# Patient Record
Sex: Female | Born: 1985 | Race: White | Hispanic: No | Marital: Single
Health system: Midwestern US, Community
[De-identification: ages and names within clinical notes are randomized; demographics above are authoritative.]

## PROBLEM LIST (undated history)

## (undated) DIAGNOSIS — E039 Hypothyroidism, unspecified: Secondary | ICD-10-CM

## (undated) DIAGNOSIS — K7460 Unspecified cirrhosis of liver: Secondary | ICD-10-CM

## (undated) DIAGNOSIS — G629 Polyneuropathy, unspecified: Secondary | ICD-10-CM

## (undated) DIAGNOSIS — F419 Anxiety disorder, unspecified: Secondary | ICD-10-CM

## (undated) DIAGNOSIS — G621 Alcoholic polyneuropathy: Secondary | ICD-10-CM

## (undated) DIAGNOSIS — F19929 Other psychoactive substance use, unspecified with intoxication, unspecified: Secondary | ICD-10-CM

## (undated) DIAGNOSIS — F902 Attention-deficit hyperactivity disorder, combined type: Secondary | ICD-10-CM

## (undated) DIAGNOSIS — K7030 Alcoholic cirrhosis of liver without ascites: Secondary | ICD-10-CM

## (undated) HISTORY — DX: Hypothyroidism, unspecified: E03.9

## (undated) HISTORY — DX: Unspecified cirrhosis of liver: K74.60

## (undated) HISTORY — DX: Anxiety disorder, unspecified: F41.9

## (undated) HISTORY — DX: Polyneuropathy, unspecified: G62.9

---

## 2006-03-24 HISTORY — PX: WISDOM TOOTH EXTRACTION: SHX21

## 2012-05-06 ENCOUNTER — Emergency Department: Payer: Self-pay

## 2012-05-06 ENCOUNTER — Emergency Department
Admission: EM | Admit: 2012-05-06 | Discharge: 2012-05-06 | Disposition: A | Discharge status: Home / Self Care | Payer: Self-pay | Attending: Emergency Medical Services | Admitting: Emergency Medical Services

## 2012-05-06 DIAGNOSIS — W269XXA Contact with unspecified sharp object(s), initial encounter: Secondary | ICD-10-CM | POA: Insufficient documentation

## 2012-05-06 DIAGNOSIS — F17200 Nicotine dependence, unspecified, uncomplicated: Secondary | ICD-10-CM | POA: Insufficient documentation

## 2012-05-06 DIAGNOSIS — S61209A Unspecified open wound of unspecified finger without damage to nail, initial encounter: Secondary | ICD-10-CM | POA: Insufficient documentation
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
## 2012-05-06 NOTE — ED Notes (Signed)
Bed:A11<BR> Expected date:<BR> Expected time:<BR> Means of arrival:<BR> Comments:<BR> Medic 417

## 2015-11-13 ENCOUNTER — Encounter (HOSPITAL_BASED_OUTPATIENT_CLINIC_OR_DEPARTMENT_OTHER): Payer: Self-pay | Admitting: Professional

## 2015-11-13 NOTE — Progress Notes (Signed)
Pt declined services as she does not have insurance at this time. She was given the number for [facility]

## 2016-02-13 ENCOUNTER — Other Ambulatory Visit: Payer: Self-pay | Admitting: Cardiovascular Disease

## 2016-02-13 DIAGNOSIS — R55 Syncope and collapse: Secondary | ICD-10-CM

## 2016-04-16 ENCOUNTER — Ambulatory Visit: Payer: Self-pay

## 2016-12-03 ENCOUNTER — Ambulatory Visit (INDEPENDENT_AMBULATORY_CARE_PROVIDER_SITE_OTHER): Payer: Self-pay

## 2016-12-19 ENCOUNTER — Encounter (HOSPITAL_BASED_OUTPATIENT_CLINIC_OR_DEPARTMENT_OTHER): Payer: Self-pay

## 2016-12-19 NOTE — Progress Notes (Signed)
Date/Time: 12/19/2016, 12:05 PM  Interviewer: Donivan Scull  Patient Name: Erin Huber   DOB: [birth year]  SSN: [id number]  [id number]  Gender: female  Patient Address:  [phone] [facility] [address] [address]  Patient Phone Number(s):  [phone] (home)  , additional phone number: [phone] ( Mom's Number) Pam     Have you confirmed the pt's contact information above? Yes      Emergency Contact: Berkley Harvey, Phone number: [phone]  Relationship to Pt: sister   May we contact this person regarding your admission? yes    Caller: Audree Bane  , Caller phone:[phone], Caller relationship:    Insurance Info:   Company: Development worker, community [facility]):   Audree Bane   Relationship to PT: self     PH DOB: 12/16/2018  Member ID: [id number]  Group #: [id number]    Insured Employer: [employer]  Phone number: Providers/Mental Health/SA: [phone]  Insurance Address: [address] [place] [address]    Identify if possible:   Referring Provider Name: n/a  Phone Number, if available:n/a      Clinical Information  Presenting Problem: Pt reports she is drinking too much  Precipitating Event: Liver Failure    Suicidal/Homicidal Risk?    Any hx of Suicidal thoughts or plans? No    Any hx of Homicidal Thoughts or Plans?  No    Imminent Risk of Suicide and/or Harm to Others?  No    TASR Risk / Protective Factors:   No risk factors           Drug Use  Do you use tobacco?: Yes 1/2-1 pack a day     Any current use of alcohol or drugs? alcohol    Include type, frequency, amount and route of use    Drug of Choice # 1: Vokda  Pattern of use:6 shots a day  Last use? 12/18/2016    Drug of Choice # 2 : White Wine   Pattern of use: 1 bottle a day  Last use? 14 days     Drug of Choice # 3 : Kathlene November Hard Lemonade  Pattern of use: 3 bottles  Last use?: 12/18/2016    Additional drug use info: Pt denies additional drug use    Current Withdrawal Symptoms:  Shaking, nausea, chills, sweats    History of and last date of:  12/19/2016- pt is currently experiencing withdrawls    Hallucinations? No          Seizures?  No    Psych/Mental health Issues: anxiety , panic attacks and polysubstance dependence       Medical issues: Liver Failure, Low K+ levels    Current Medication (medical or psychiatric): Geraponum- advised not to take anymore ( Derapamil)             Allopurinol: 100mg /daily    Pt is unaware of who many medictions    Additional Notes:   [name] lives with boyfriend. Patient notes that she has a good support system. Mother and sister are are bed side  Pt started drinking at 13 yr/old.  Drinking became an issue during high school    Does the patient require Hard of Hearing Services? No    Does the patient require language services? No    Preliminary Diagnosis: Alcohol Use Disorder, Moderate  Alcohol Intoxication, With use disorder, moderate or severe    Recommended Level of Care (LOC): [facility] INPATIENT    Patient has been placed on the waittlst  for [facility].

## 2016-12-20 ENCOUNTER — Telehealth (HOSPITAL_BASED_OUTPATIENT_CLINIC_OR_DEPARTMENT_OTHER): Payer: Self-pay | Admitting: Addiction (Substance Use Disorder)

## 2016-12-20 NOTE — Telephone Encounter (Signed)
TW spoke with PT who is currently in [facility] hospital for Vitamin B deficiency.  Pt reports she has never been to treatment before, never had education on alcoholism, and never gone to AA for support.  Pt is choosing to decline bed to bed transfer and come off waitlist because she wants to discharge home.

## 2016-12-20 NOTE — Telephone Encounter (Signed)
Mother called back earlier.  Asked if her daughter could call me.  Spoke to August, Pt's sister, explained everything about bed to bed transfer arrangements.  Ended call.  Victorino Dike called back and stated "[facility] is saying they are not doing bed to bed transfer and will keep Pt one more day and discharge home.

## 2016-12-20 NOTE — Telephone Encounter (Signed)
Case manager called regarding bed to bed transfer tomorrow because she reports the doctor stated, "I am discharging her home because she is withdrawing."  This writer explained the process of bed to bed transfer to the case manager.  Case manager is going to talk to the doctor.   Case manager is reporting that the Pt was going to discharge home and present to [facility] through [facility] ER.  Case manager is now going back to doctor to discuss options.  Be aware that Victorino Dike, Pt's sister and Mother have both stated that the Pt will definitely drink again as soon as she leaves the hospital.  Family is not happy.

## 2016-12-21 ENCOUNTER — Encounter (HOSPITAL_BASED_OUTPATIENT_CLINIC_OR_DEPARTMENT_OTHER): Payer: Self-pay | Admitting: Addiction (Substance Use Disorder)

## 2016-12-21 ENCOUNTER — Inpatient Hospital Stay
Admission: RE | Admit: 2016-12-21 | Discharge: 2016-12-26 | DRG: 895 | Disposition: A | Discharge status: Home / Self Care | Payer: Commercial Managed Care - PPO | Source: Ambulatory Visit | Attending: Psychiatry | Admitting: Psychiatry

## 2016-12-21 ENCOUNTER — Emergency Department
Admission: EM | Admit: 2016-12-21 | Discharge: 2016-12-21 | Disposition: A | Discharge status: Other Acute Inpatient Hospital | Payer: Commercial Managed Care - PPO | Attending: Emergency Medicine | Admitting: Emergency Medicine

## 2016-12-21 ENCOUNTER — Emergency Department (EMERGENCY_DEPARTMENT_HOSPITAL): Payer: Commercial Managed Care - PPO

## 2016-12-21 ENCOUNTER — Encounter: Payer: Self-pay | Admitting: Addiction Medicine

## 2016-12-21 DIAGNOSIS — E875 Hyperkalemia: Secondary | ICD-10-CM | POA: Insufficient documentation

## 2016-12-21 DIAGNOSIS — D649 Anemia, unspecified: Secondary | ICD-10-CM | POA: Diagnosis present

## 2016-12-21 DIAGNOSIS — F17210 Nicotine dependence, cigarettes, uncomplicated: Secondary | ICD-10-CM | POA: Diagnosis present

## 2016-12-21 DIAGNOSIS — F1010 Alcohol abuse, uncomplicated: Secondary | ICD-10-CM | POA: Insufficient documentation

## 2016-12-21 DIAGNOSIS — G4700 Insomnia, unspecified: Secondary | ICD-10-CM | POA: Diagnosis present

## 2016-12-21 DIAGNOSIS — F419 Anxiety disorder, unspecified: Secondary | ICD-10-CM | POA: Diagnosis present

## 2016-12-21 DIAGNOSIS — F1020 Alcohol dependence, uncomplicated: Secondary | ICD-10-CM

## 2016-12-21 DIAGNOSIS — D696 Thrombocytopenia, unspecified: Secondary | ICD-10-CM | POA: Diagnosis present

## 2016-12-21 DIAGNOSIS — F10239 Alcohol dependence with withdrawal, unspecified: Principal | ICD-10-CM | POA: Diagnosis present

## 2016-12-21 DIAGNOSIS — K709 Alcoholic liver disease, unspecified: Secondary | ICD-10-CM | POA: Diagnosis present

## 2016-12-21 DIAGNOSIS — R6889 Other general symptoms and signs: Secondary | ICD-10-CM | POA: Diagnosis present

## 2016-12-21 LAB — ETHANOL: Alcohol: 72 mg/dL — ABNORMAL HIGH

## 2016-12-21 LAB — RAPID DRUG SCREEN, URINE
Barbiturate Screen, UR: NEGATIVE
Benzodiazepine Screen, UR: NEGATIVE
Cannabinoid Screen, UR: NEGATIVE
Cocaine, UR: NEGATIVE
Opiate Screen, UR: NEGATIVE
PCP Screen, UR: NEGATIVE
Urine Amphetamine Screen: NEGATIVE

## 2016-12-21 LAB — BASIC METABOLIC PANEL
BUN: 4 mg/dL — ABNORMAL LOW (ref 7.0–19.0)
CO2: 16 mEq/L — ABNORMAL LOW (ref 22–29)
Calcium: 10 mg/dL (ref 8.5–10.5)
Chloride: 100 mEq/L (ref 100–111)
Creatinine: 0.7 mg/dL (ref 0.6–1.0)
Glucose: 88 mg/dL (ref 70–100)
Potassium: 5.4 mEq/L — ABNORMAL HIGH (ref 3.5–5.1)
Sodium: 133 mEq/L — ABNORMAL LOW (ref 136–145)

## 2016-12-21 LAB — GFR: EGFR: >60

## 2016-12-21 MED ORDER — TRAZODONE HCL 50 MG PO TABS
75.0000 mg | ORAL_TABLET | Freq: Every evening | ORAL | Status: DC | PRN
Start: 2016-12-21 — End: 2016-12-26
  Administered 2016-12-21 – 2016-12-25 (×5): 75 mg via ORAL
  Filled 2016-12-21 (×5): qty 2

## 2016-12-21 MED ORDER — SUCRALFATE 1 GM/10ML PO SUSP
1.0000 g | Freq: Four times a day (QID) | ORAL | Status: DC
Start: 2016-12-21 — End: 2016-12-26
  Administered 2016-12-21 – 2016-12-26 (×18): 1 g via ORAL
  Filled 2016-12-21 (×18): qty 10

## 2016-12-21 MED ORDER — ONDANSETRON 4 MG PO TBDP
4.0000 mg | ORAL_TABLET | Freq: Four times a day (QID) | ORAL | Status: DC | PRN
Start: 2016-12-21 — End: 2016-12-26

## 2016-12-21 MED ORDER — NALOXONE HCL 0.4 MG/ML IJ SOLN (WRAP)
0.8000 mg | INTRAMUSCULAR | Status: DC | PRN
Start: 2016-12-21 — End: 2016-12-26

## 2016-12-21 MED ORDER — LORAZEPAM 1 MG PO TABS
2.0000 mg | ORAL_TABLET | ORAL | Status: AC | PRN
Start: 2016-12-22 — End: 2016-12-23
  Administered 2016-12-22 – 2016-12-23 (×6): 2 mg via ORAL
  Filled 2016-12-21 (×6): qty 2

## 2016-12-21 MED ORDER — DICYCLOMINE HCL 10 MG PO CAPS
20.0000 mg | ORAL_CAPSULE | ORAL | Status: DC | PRN
Start: 2016-12-21 — End: 2016-12-26
  Administered 2016-12-21: 21:00:00 20 mg via ORAL
  Filled 2016-12-21: qty 2

## 2016-12-21 MED ORDER — PROPRANOLOL HCL 10 MG PO TABS
20.0000 mg | ORAL_TABLET | Freq: Three times a day (TID) | ORAL | Status: DC | PRN
Start: 2016-12-21 — End: 2016-12-26
  Administered 2016-12-22 – 2016-12-25 (×2): 20 mg via ORAL
  Filled 2016-12-21 (×2): qty 2

## 2016-12-21 MED ORDER — HYDROXYZINE PAMOATE 25 MG PO CAPS
25.0000 mg | ORAL_CAPSULE | Freq: Four times a day (QID) | ORAL | Status: DC | PRN
Start: 2016-12-21 — End: 2016-12-26
  Administered 2016-12-23 – 2016-12-25 (×3): 25 mg via ORAL
  Filled 2016-12-21 (×4): qty 1

## 2016-12-21 MED ORDER — ALLOPURINOL 100 MG PO TABS
100.0000 mg | ORAL_TABLET | Freq: Every day | ORAL | Status: DC
Start: 2016-12-22 — End: 2016-12-26
  Administered 2016-12-22 – 2016-12-26 (×5): 100 mg via ORAL
  Filled 2016-12-21 (×5): qty 1

## 2016-12-21 MED ORDER — IBUPROFEN 400 MG PO TABS
400.0000 mg | ORAL_TABLET | Freq: Four times a day (QID) | ORAL | Status: DC | PRN
Start: 2016-12-21 — End: 2016-12-26

## 2016-12-21 MED ORDER — MAGNESIUM HYDROXIDE 400 MG/5ML PO SUSP
30.0000 mL | Freq: Every evening | ORAL | Status: DC | PRN
Start: 2016-12-21 — End: 2016-12-26

## 2016-12-21 MED ORDER — LORAZEPAM 1 MG PO TABS
1.0000 mg | ORAL_TABLET | Freq: Four times a day (QID) | ORAL | Status: AC | PRN
Start: 2016-12-24 — End: 2016-12-25
  Administered 2016-12-24 – 2016-12-25 (×4): 1 mg via ORAL
  Filled 2016-12-21 (×4): qty 1

## 2016-12-21 MED ORDER — SODIUM CHLORIDE 0.9 % IV BOLUS
1000.0000 mL | Freq: Once | INTRAVENOUS | Status: AC
Start: 2016-12-21 — End: 2016-12-21
  Administered 2016-12-21: 1000 mL via INTRAVENOUS

## 2016-12-21 MED ORDER — LORAZEPAM 2 MG/ML IJ SOLN
2.0000 mg | Freq: Once | INTRAMUSCULAR | Status: AC
Start: 2016-12-21 — End: 2016-12-21
  Administered 2016-12-21: 2 mg via INTRAVENOUS
  Filled 2016-12-21: qty 1

## 2016-12-21 MED ORDER — LORAZEPAM 1 MG PO TABS
1.0000 mg | ORAL_TABLET | ORAL | Status: AC | PRN
Start: 2016-12-23 — End: 2016-12-24
  Administered 2016-12-24 (×3): 1 mg via ORAL
  Filled 2016-12-21 (×3): qty 1

## 2016-12-21 MED ORDER — TAB-A-VITE/BETA CAROTENE PO TABS
1.0000 | ORAL_TABLET | Freq: Every day | ORAL | Status: DC
Start: 2016-12-21 — End: 2016-12-26
  Administered 2016-12-21 – 2016-12-26 (×6): 1 via ORAL
  Filled 2016-12-21 (×6): qty 1

## 2016-12-21 MED ORDER — THIAMINE (VITAMIN B1) 100 MG PO TABS (WRAP)
100.0000 mg | ORAL_TABLET | Freq: Every day | ORAL | Status: DC
Start: 2016-12-21 — End: 2016-12-26
  Administered 2016-12-22 – 2016-12-26 (×5): 100 mg via ORAL
  Filled 2016-12-21 (×5): qty 1

## 2016-12-21 MED ORDER — LORAZEPAM 1 MG PO TABS
2.0000 mg | ORAL_TABLET | ORAL | Status: AC | PRN
Start: 2016-12-21 — End: 2016-12-22
  Administered 2016-12-21 – 2016-12-22 (×3): 2 mg via ORAL
  Filled 2016-12-21 (×3): qty 2

## 2016-12-21 MED ORDER — NICOTINE 21 MG/24HR TD PT24
1.0000 | MEDICATED_PATCH | Freq: Every day | TRANSDERMAL | Status: DC
Start: 2016-12-21 — End: 2016-12-26
  Administered 2016-12-22 – 2016-12-26 (×5): 1 via TRANSDERMAL
  Filled 2016-12-21 (×5): qty 1

## 2016-12-21 MED ORDER — FOLIC ACID 1 MG PO TABS
1.0000 mg | ORAL_TABLET | Freq: Every day | ORAL | Status: DC
Start: 2016-12-21 — End: 2016-12-26
  Administered 2016-12-21 – 2016-12-26 (×6): 1 mg via ORAL
  Filled 2016-12-21 (×6): qty 1

## 2016-12-21 MED ORDER — NICOTINE POLACRILEX 2 MG MT GUM
2.0000 mg | CHEWING_GUM | OROMUCOSAL | Status: DC | PRN
Start: 2016-12-21 — End: 2016-12-26
  Administered 2016-12-22: 18:00:00 2 mg via BUCCAL
  Filled 2016-12-21: qty 1

## 2016-12-21 MED ORDER — LOPERAMIDE HCL 2 MG PO CAPS
2.0000 mg | ORAL_CAPSULE | ORAL | Status: DC | PRN
Start: 2016-12-21 — End: 2016-12-26

## 2016-12-21 MED ORDER — TUBERCULIN PPD 5 UNIT/0.1ML ID SOLN
0.1000 mL | Freq: Once | INTRADERMAL | Status: DC
Start: 2016-12-21 — End: 2016-12-26

## 2016-12-21 MED ORDER — ALUM & MAG HYDROXIDE-SIMETH 200-200-20 MG/5ML PO SUSP
30.0000 mL | ORAL | Status: DC | PRN
Start: 2016-12-21 — End: 2016-12-26

## 2016-12-21 MED ORDER — FAMOTIDINE 20 MG PO TABS
20.0000 mg | ORAL_TABLET | Freq: Two times a day (BID) | ORAL | Status: DC
Start: 2016-12-21 — End: 2016-12-26
  Administered 2016-12-21 – 2016-12-26 (×10): 20 mg via ORAL
  Filled 2016-12-21 (×10): qty 1

## 2016-12-21 MED ORDER — THIAMINE HCL 100 MG/ML IJ SOLN
100.0000 mg | Freq: Once | INTRAMUSCULAR | Status: AC
Start: 2016-12-21 — End: 2016-12-21
  Administered 2016-12-21: 21:00:00 100 mg via INTRAMUSCULAR
  Filled 2016-12-21: qty 2

## 2016-12-21 MED ORDER — LORAZEPAM 1 MG PO TABS
1.0000 mg | ORAL_TABLET | Freq: Three times a day (TID) | ORAL | Status: DC | PRN
Start: 2016-12-25 — End: 2016-12-26

## 2016-12-21 NOTE — ED Provider Notes (Signed)
I ordered testing and/or medication from triage to facilitate patient care and comfort. However, I was otherwise uninvolved in patient care.    31yo F with hx ETOH abuse, seen at [facility], medically cleared for [facility], was on their way to [facility], but stopped by to get a drink of alcohol. Per staff at [facility], pt needs repeat etoh and drugs of abuse only and then can go to [facility].      Erin Alvine, MD  12/21/16 [phone]

## 2016-12-21 NOTE — ED Provider Notes (Signed)
[facility] EMERGENCY DEPARTMENT H&P      Visit date: 12/21/2016      CLINICAL SUMMARY           Diagnosis:    .     Final diagnoses:   Alcohol abuse   Hyperkalemia         MDM Notes:      Alcohol abuse vs withdrawal vs substance abuse vs psychosis nos vs depression vs SI/HI         Disposition:         Transfer to [facility]                CLINICAL INFORMATION        HPI:      Chief Complaint: Inpatient Detox  .    Erin Huber is a 31 y.o. female with PMHx of liver damage who presents for transfer to [facility]. Pt was just seen at [facility] for detox with referral to [facility]. States that she already has a bed there. On the way to [facility] pt states that she stopped for a drink on the way to [facility] and now presents for inpatient detox. Pt states that she normally drinks either beer or liquor with her last drink 1-2 hours ago. Endorses tobacco use. Denies drug use, recent illnesses, and diarrhea.     History obtained from: patient, parent          ROS:      Positive and negative ROS elements as per HPI.  All other systems reviewed and negative.      Physical Exam:      Pulse (!) 120  BP 121/83  Resp 18  SpO2 99 %  Temp 97.9 F (36.6 C)    Physical Exam   Constitutional: She is oriented to person, place, and time. She appears well-developed and well-nourished. No distress.   Speech slurred.   HENT:   Head: Normocephalic and atraumatic.   Nose: Nose normal.   Mouth/Throat: Oropharynx is clear and moist.   Eyes: Pupils are equal, round, and reactive to light. Conjunctivae are normal. No scleral icterus.   Neck: Normal range of motion. Neck supple. No tracheal deviation present.   Cardiovascular: Regular rhythm, normal heart sounds and intact distal pulses.  Exam reveals no friction rub.    No murmur heard.  Tachycardic   Pulmonary/Chest: Effort normal and breath sounds normal. No stridor. No respiratory distress. She has no wheezes.   Abdominal: Soft. There is no tenderness. There is no rebound and no  guarding.   Musculoskeletal: Normal range of motion. She exhibits no edema or tenderness.   Neurological: She is alert and oriented to person, place, and time. She exhibits normal muscle tone. Coordination normal.   A&O x3   Skin: Skin is warm and dry. No rash noted. She is not diaphoretic. No erythema.   Psychiatric: She has a normal mood and affect. Her behavior is normal. Judgment and thought content normal.   Nursing note and vitals reviewed.                 PAST HISTORY        Primary Care Provider: Edmon Crape, MD        PMH/PSH:    .     History reviewed. No pertinent past medical history.    She has no past surgical history on file.      Social/Family History:      She reports that she has been smoking Cigarettes.  She has been smoking about 1.00 pack per day. She does not have any smokeless tobacco history on file. She reports that she drinks alcohol. Her drug history is not on file.    No family history on file.      Listed Medications on Arrival:    .     Home Medications     Med List Status:  In Progress Set By: Marzetta Board, RN at 12/21/2016  5:28 PM        No Medications         Allergies: She has No Known Allergies.            VISIT INFORMATION        Clinical Course in the ED:      K mildly elevated, 5.4, patient given 1 L NS, no peaked T waves on ekg, stable to go to [facility], admitted by Dr. Richardson Chiquito.           Medications Given in the ED:    .     ED Medication Orders     Start Ordered     Status Ordering Provider    12/21/16 1544 12/21/16 1543  sodium chloride 0.9 % bolus 1,000 mL  Once     Route: Intravenous  Ordered Dose: 1,000 mL     Last MAR action:  New Bag LEE, EUGENE KIM    12/21/16 1543 12/21/16 1543  LORazepam (ATIVAN) injection 2 mg  Once     Route: Intravenous  Ordered Dose: 2 mg     Last MAR action:  Given LEE, EUGENE KIM            Procedures:      Procedures      Interpretations:      O2 sat-           saturation: 99 %; Oxygen use: room air; Interpretation: Normal      EKG -              interpreted by me: normal sinus at 86.  No axis deviation; Non specific ST/T wave changes; no PQ waves; RSR pattern; no conduction delay.             RESULTS        Lab Results:      Results     Procedure Component Value Units Date/Time    GFR [[id number]] Collected:  12/21/16 1713     Updated:  12/21/16 1755     EGFR >60.0    Ethanol (Alcohol) Level [[id number]]  (Abnormal) Collected:  12/21/16 1713    Specimen:  Blood Updated:  12/21/16 1755     Alcohol 72 (H) mg/dL     Basic Metabolic Panel [[id number]]  (Abnormal) Collected:  12/21/16 1713    Specimen:  Blood Updated:  12/21/16 1755     Glucose 88 mg/dL      BUN 4.0 (L) mg/dL      Creatinine 0.7 mg/dL      Calcium 08.6 mg/dL      Sodium 578 (L) mEq/L      Potassium 5.4 (H) mEq/L      Chloride 100 mEq/L      CO2 16 (L) mEq/L     Rapid drug screen, urine [[id number]] Collected:  12/21/16 1714    Specimen:  Urine Updated:  12/21/16 1748     Amphetamine Screen, UR Negative     Barbiturate Screen, UR Negative     Benzodiazepine Screen, UR Negative  Cannabinoid Screen, UR Negative     Cocaine, UR Negative     Opiate Screen, UR Negative     PCP Screen, UR Negative              Radiology Results:      No orders to display               Scribe Attestation:      I, Alcide Evener, MD, personally performed the services documented.  Shalini Boddu is scribing for me on [place],Francia FOX. I reviewed and confirm the accuracy of the information in this medical record.     I, Shalini Boddu, am serving as a Neurosurgeon to document services personally performed by Alcide Evener, MD based on the provider's statements to me.           Alcide Evener, MD  12/23/16 [phone]

## 2016-12-21 NOTE — Discharge Instructions (Signed)
Dear Ms. Huel Cote:    Thank you for choosing the [facility] Emergency Department, the premier emergency department in the [place] area.  I hope your visit today was EXCELLENT.    Specific instructions for your visit today:    If you do not continue to improve or your condition worsens, please contact your doctor or return immediately to the Emergency Department.    Sincerely,  Alcide Evener,*  Attending Emergency Physician  [facility] Emergency Department    ONSITE PHARMACY  Our full service onsite pharmacy is located in the ER waiting room.  Open 7 days a week from 9 am to 11 pm.  We accept all major insurances and prices are competitive with major retailers.  Ask your provider to print your prescriptions down to the pharmacy to speed you on your way home.    OBTAINING A PRIMARY CARE APPOINTMENT    Primary care physicians (PCPs, also known as primary care doctors) are either internists or family medicine doctors. Both types of PCPs focus on health promotion, disease prevention, patient education and counseling, and treatment of acute and chronic medical conditions.    Call for an appointment with a primary care doctor.  Ask to see who is taking new patients.     [facility]  telephone:  [phone]  [email or web address]    DOCTOR REFERRALS  Call [phone] (available 24 hours a day, 7 days a week) if you need any further referrals and we can help you find a primary care doctor or specialist.  Also, available online at:  [email or web address]    YOUR CONTACT INFORMATION  Before leaving please check with registration to make sure we have an up-to-date contact number.  You can call registration at [phone] to update your information.  For questions about your hospital bill, please call [phone].  For questions about your Emergency Dept Physician bill please call [phone].      FREE HEALTH SERVICES  If you need help with health or social services,  please call 2-1-1 for a free referral to resources in your area.  2-1-1 is a free service connecting people with information on health insurance, free clinics, pregnancy, mental health, dental care, food assistance, housing, and substance abuse counseling.  Also, available online at:  http://www.[facility].org    MEDICAL RECORDS AND TESTS  Certain laboratory test results do not come back the same day, for example urine cultures.   We will contact you if other important findings are noted.  Radiology films are often reviewed again to ensure accuracy.  If there is any discrepancy, we will notify you.      Please call [phone] to pick up a complimentary CD of any radiology studies performed.  If you or your doctor would like to request a copy of your medical records, please call [phone].      ORTHOPEDIC INJURY   Please know that significant injuries can exist even when an initial x-ray is read as normal or negative.  This can occur because some fractures (broken bones) are not initially visible on x-rays.  For this reason, close outpatient follow-up with your primary care doctor or bone specialist (orthopedist) is required.    MEDICATIONS AND FOLLOWUP  Please be aware that some prescription medications can cause drowsiness.  Use caution when driving or operating machinery.    The examination and treatment you have received in our Emergency Department is provided on an emergency basis, and is not intended to be a substitute for  your primary care physician.  It is important that your doctor checks you again and that you report any new or remaining problems at that time.      [facility]  The nearest 24 hour pharmacy is:    [facility] [place], [place] [address]  [facility]  [facility])  Call to start or finish an application, compare plans, enroll or ask a question.  [place]: [phone]  Web:   Healthcare.gov    Help Enrolling in [place]  ([phone] 067-6931 (TOLL-FREE)  270-769-6442 (TTY)  Web:  Http://www.coverva.org    Local Help Enrolling in the [place]  8387519540 (MAIN)  Email:  health-help_0 .org  Web:  http://lewis-perez.info/  Address:  [address], [address] [place], [place] [address]    SEDATING MEDICATIONS  Sedating medications include strong pain medications (e.g. narcotics), muscle relaxers, benzodiazepines (used for anxiety and as muscle relaxers), Benadryl/diphenhydramine and other antihistamines for allergic reactions/itching, and other medications.  If you are unsure if you have received a sedating medication, please ask your physician or nurse.  If you received a sedating medication: DO NOT drive a car. DO NOT operate machinery. DO NOT perform jobs where you need to be alert.  DO NOT drink alcoholic beverages while taking this medicine.     If you get dizzy, sit or lie down at the first signs. Be careful going up and down stairs.  Be extra careful to prevent falls.     Never give this medicine to others.     Keep this medicine out of reach of children.     Do not take or save old medicines. Throw them away when outdated.     Keep all medicines in a cool, dry place. DO NOT keep them in your bathroom medicine cabinet or in a cabinet above the stove.    MEDICATION REFILLS  Please be aware that we cannot refill any prescriptions through the ER. If you need further treatment from what is provided at your ER visit, please follow up with your primary care doctor or your pain management specialist.    [place]  Did you know [facility] has two freestanding ERs located just a few miles away?  [facility] [place] [facility] [place] have short wait times, easy free parking directly in front of the building and top patient satisfaction scores - and the same Board Certified Emergency  Medicine doctors as [facility].

## 2016-12-21 NOTE — Plan of Care (Signed)
Erin Huber is a new patient, admitted to inpatient [facility] during the change of shift: Patient is a 31 year old female admitted for alcohol detox.  Patient was [facility] from [facility] ED to Inpatient [facility].  According to the Patient, has been drinking heavily of late; and usually drinks 8 to 10 shots of liquor, a bottle of wine, and 3 to 6 hard lemonades daily.  Denies any history of seizures, DTs and Hallucinations.  Patient also denies any SI/HI and AH/VH.  Was cooperative with the After hours Admission assessment/interview.  Was oriented to the Unit and the her room environment.  Patient was placed on Ativan Protocol.  Patient's mother visited, during the evening's visiting hours.  Remains cooperative and compliant with treatment and other treatment medications.  Will continue to monitor.  Staff monitoring for safety and support is ongoing and Patient is able to maintain safety.

## 2016-12-21 NOTE — Progress Notes (Addendum)
1809: TW called [employer] at [phone] and [phone] to complete an authorization for this pt's inpatient admission. TW reached [employer] outside of business hours and left a VM requesting a return call as well as providing contact information for TW and the [facility] UR. [facility] UR, Ramona C., notified.     1916: TW received return call from [facility], Junious Dresser. Care manager informed TW that [employer] only handles the benefits for this member's plan and Rosann Auerbach is the "UR vendor." Care Manager instructed this writer to call same number [phone]) during regular business hours to be directed to [employer] to complete the authorization. Celeste to document in system that TW attempted to complete authorization at this time.

## 2016-12-21 NOTE — Progress Notes (Signed)
Psychiatric Evaluation Part I    Erin Huber is a 31 y.o. female admitted to the [facility] Emergency Department who was seen on 12/21/2016 by Romie Levee, LCSW.    Call Details  Patient Location: [facility] ED  Patient Room Number: RW7  Time contacted by ED Physician: [phone]  Time consult began: 1640  Time (in minutes) from Call to Consult: 2  Time consult concluded: 1655  Referring ED Department  Emergency Department: [facility]        Discharge Planning  Living Arrangements: Spouse/significant other  Support Systems: Spouse/significant other  Type of Residence: Private residence  Patient expects to be discharged to:: home    Presenting Mental Status  Orientation Level: Oriented X4  Memory: No Impairment  Thought Content: normal  Thought Process: normal  Behavior: normal  Consciousness: Alert  Impulse Control: normal  Perception: normal  Eye Contact: normal  Attitude: cooperative  Mood: anxious  Hopelessness Affects Goals: No  Hopelessness About Future: No  Affect: normal  Speech: normal  Concentration: normal  Insight: fair  Judgment: fair  Appearance: normal  Appetite: normal  Weight change?: normal  Energy: normal  Sleep: normal  Reliability of Reporter/Patient: good    Tool for Assessment of Suicide Risk  Symptom Risk Profile: anxiety  Interview Risk Profile: recent substance abuse  Protective Factors: supportive significant other, safety plan, employed  Level of Suicide Risk: Low    Within the Last 6 Months:: no history of violence toward self  Greater than 6 Months Ago:: no history of violence toward self              Substance Abuse History  Previous Substance Abuse Treatment?: No  Self-Help Group Involvement  Current Involvement: None  Past Involvement: None  Substance Recovery Support  Have you been prescribed medications to support recovery?: None  Past Withdrawal Symptoms  Past Withdrawal Symptoms: Anxiety, Tremors, Nausea/vomiting  History of Blackouts?: No  History of Withdrawal Seizures?:  No    Preliminary Diagnosis #1: alcohol use disorder, severe, dependence F10.20           Violence Toward Others  Within the Last 6 Months:: no history of violence toward others  Greater than 6 Months Ago:: no history of violence toward others     Preliminary Diagnosis (DSM IV)  Axis I: alcohol use disorder, severe, dependence   Axis II: deferred   Axis III: gout, hypokalemia         Summary: 31 y/o F who presents to the ER for [facility] admission. Pt states her drinking only became a problem a few months ago, although she acknowledges she started drinking at 72 and had several alcohol related charges as a teen (DIP at 4, possession of ETOH at 46, and a DUI at 24). Pt reports over the past few months, she has been heavily drinking, usually 8-10 shots of liquor, a bottle of wine, and 3-6 hard lemonades daily. Pt reports withdrawal symptoms of tremors, n/v, chills, sweats, and anxiety. Pt denies hx of withdrawal seizures or hallucinations. Pt reports she smokes, 1/2-1 ppd. Pt denies other substance abuse. Pt was d/c from [place] earlier today due to gout/hypokalemia and was due to go directly to [facility]; however, EMS transport was not set up, and mother brought pt to [facility] ER after stopping to get pt a drink. Pt denies previous psychiatric treatment. Pt denies SI/HI, AH/VH. 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 Pt's mother is present in ER with pt and appears supportive.     Disposition: accepted to [facility] by Dr. Richardson Chiquito, who requests repeat BMP, ETOH, and UDS. Dr. Gerarda Fraction aware.     Justification for disposition: alcohol dependence     Insurance Pre-authorization information: [employer]--pending by MHT Deniece Ree, LCSW    [facility]  [address]  [place], [place] [address]  [phone]

## 2016-12-22 DIAGNOSIS — F329 Major depressive disorder, single episode, unspecified: Secondary | ICD-10-CM

## 2016-12-22 DIAGNOSIS — F419 Anxiety disorder, unspecified: Secondary | ICD-10-CM

## 2016-12-22 DIAGNOSIS — R6889 Other general symptoms and signs: Secondary | ICD-10-CM

## 2016-12-22 DIAGNOSIS — F10239 Alcohol dependence with withdrawal, unspecified: Principal | ICD-10-CM

## 2016-12-22 DIAGNOSIS — F17200 Nicotine dependence, unspecified, uncomplicated: Secondary | ICD-10-CM

## 2016-12-22 LAB — ALT: ALT: 47 U/L (ref 0–55)

## 2016-12-22 LAB — BASIC METABOLIC PANEL
BUN: 4 mg/dL — ABNORMAL LOW (ref 7.0–19.0)
CO2: 20 mEq/L — ABNORMAL LOW (ref 21–29)
Calcium: 9.3 mg/dL (ref 8.5–10.5)
Chloride: 106 mEq/L (ref 100–111)
Creatinine: 0.7 mg/dL (ref 0.4–1.5)
Glucose: 90 mg/dL (ref 70–100)
Potassium: 4.5 mEq/L (ref 3.5–5.1)
Sodium: 135 mEq/L — ABNORMAL LOW (ref 136–145)

## 2016-12-22 LAB — CBC
Absolute NRBC: 0 10*3/uL
Hematocrit: 31.8 % — ABNORMAL LOW (ref 37.0–47.0)
Hgb: 10.7 g/dL — ABNORMAL LOW (ref 12.0–16.0)
MCH: 41.2 pg — ABNORMAL HIGH (ref 28.0–32.0)
MCHC: 33.6 g/dL (ref 32.0–36.0)
MCV: 122.3 fL — ABNORMAL HIGH (ref 80.0–100.0)
MPV: 12.9 fL — ABNORMAL HIGH (ref 9.4–12.3)
Nucleated RBC: 0 /100 WBC (ref 0.0–1.0)
Platelets: 110 10*3/uL — ABNORMAL LOW (ref 140–400)
RBC: 2.6 10*6/uL — ABNORMAL LOW (ref 4.20–5.40)
RDW: 14 % (ref 12–15)
WBC: 4.1 10*3/uL (ref 3.50–10.80)

## 2016-12-22 LAB — HEPATITIS B SURFACE ANTIGEN W/ REFLEX TO CONFIRMATION: Hepatitis B Surface Antigen: NONREACTIVE

## 2016-12-22 LAB — ECG 12-LEAD
Atrial Rate: 86 {beats}/min
P Axis: 46 degrees
P-R Interval: 138 ms
Q-T Interval: 378 ms
QRS Duration: 84 ms
QTC Calculation (Bezet): 452 ms
R Axis: 54 degrees
T Axis: 25 degrees
Ventricular Rate: 86 {beats}/min

## 2016-12-22 LAB — HIV AG/AB 4TH GENERATION: HIV Ag/Ab, 4th Generation: NONREACTIVE

## 2016-12-22 LAB — RPR (REFLEX TO TITER AND CONFIRMATION): RPR: NONREACTIVE

## 2016-12-22 LAB — LIPID PANEL
Cholesterol / HDL Ratio: 13.9
Cholesterol: 236 mg/dL — ABNORMAL HIGH (ref 0–199)
HDL: 17 mg/dL — ABNORMAL LOW (ref 40–9999)
LDL Calculated: 187 mg/dL — ABNORMAL HIGH (ref 0–99)
Triglycerides: 158 mg/dL — ABNORMAL HIGH (ref 34–149)
VLDL Calculated: 32 mg/dL (ref 10–40)

## 2016-12-22 LAB — GGT: GGT: 1393 U/L — ABNORMAL HIGH (ref 9–36)

## 2016-12-22 LAB — HCG QUANTITATIVE: hCG, Quant.: <1.2 (ref 0.0–4.9)

## 2016-12-22 LAB — HEPATITIS C ANTIBODY: Hepatitis C, AB: NONREACTIVE

## 2016-12-22 LAB — HEMOLYSIS INDEX: Hemolysis Index: 10 (ref 0–18)

## 2016-12-22 LAB — AST: AST (SGOT): 130 U/L — ABNORMAL HIGH (ref 5–34)

## 2016-12-22 LAB — ALBUMIN: Albumin: 2.9 g/dL — ABNORMAL LOW (ref 3.5–5.0)

## 2016-12-22 LAB — MAGNESIUM: Magnesium: 1.4 mg/dL — ABNORMAL LOW (ref 1.6–2.6)

## 2016-12-22 LAB — GFR: EGFR: >60

## 2016-12-22 MED ORDER — MAGNESIUM OXIDE 400 MG TABS (WRAP)
400.0000 mg | ORAL_TABLET | Freq: Two times a day (BID) | ORAL | Status: AC
Start: 2016-12-22 — End: 2016-12-23
  Administered 2016-12-22 – 2016-12-23 (×4): 400 mg via ORAL
  Filled 2016-12-22 (×4): qty 1

## 2016-12-22 NOTE — Treatment Plan (Signed)
Interdisciplinary Treatment Plan Update Meeting    12/22/2016  Darrin Nipper    Participants:  Patient:  Darrin Nipper  Attending Physician:  Jacqualyn Posey, MD  RN: Elnita Maxwell  Mental Health Therapist: Susy Frizzle    Objective:  Review response to treatment, reassess needs/goals, update plan as indicated incorporating patient's strengths and stated needs, goals, and preferences.    Socrates score:    Recognition:34-35 High recognition: Treatment team will assist Nelline with making aftercare plans. Arlyn  is ready to take steps.      Ambivalence:17-20 High ambivalence: Paired with low scores on taking steps, treatment team will use biological markers and recent life consequences to help Juda  recognize the impact of substance use in her life.     Taking steps: 8-30 Taking Steps Low Score: Treatment team will assist Carline  with understanding the biopsychosocial need for detox and with understanding the consequences of continued substance abuse.    1. Summary of Patient Progress on Treatment Plan Goals:  Carliss reported that she is seeking detox from alcohol and noted that she recently saw a physician who explained that her liver is inflamed and her platelets are low.  Patient reported that she suffers from anxiety in general.  Patient reported that she began drinking daily 7 years ago.  She noted that her tolerance increased over the past 7 years.  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  Patient reported that she lives with her boyfriend and noted that he drinks as well.  The treatment team also stressed the importance of an aftercare plan to help maintain sobriety.    2. Level of Patient Involvement:  Actively engaged/contributing    3. Patient Understanding of Plan of Care:  Able to verbalize goals and interventions, Able to verbalize discharge criteria    4. Level of Agreement/Commitment to Plan of Care:  Agrees with and is committed to plan of care      Contributor  Signatures:      MD_________________________________ Date___________________    Counselor____________________________Date ___________________    RN _________________________________Date____________________

## 2016-12-22 NOTE — Progress Notes (Addendum)
Dr. Derryl Harbor called for patient's elevated HR; Pt gave verbal order to give atenolol  For HR of 107; Medication was effective hr now 88;

## 2016-12-22 NOTE — [facility] Treatment History (Signed)
CHEMICAL DEPENDENCE AND MENTAL HEALTH TREATMENT HISTORY  INCLUDE BOTH INPATIENT AND OUTPATIENT TREATMENT ATTEMPTS            Facility Name Dates Presenting Problem Outcome/Years Sober    [facility]  31 yo  DUI     [facility] IP  12/21/16  alcohol     PCP, Dr. Edmon Crape    last saw a couple of weeks ago    psychiatrist/therapist, none
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
## 2016-12-22 NOTE — Plan of Care (Signed)
Problem: Addiction to alcohol or opioids or other substances AS EVIDENCED BY:  Goal: Patient achieves a safe detoxification and management of withdrawal symptoms  Outcome: Progressing  Patient remains on Ativan PRN Protocol and endorsed symptoms indicative of increased anxiety, sweats, tremors, agitation, cramps, and insomnia.  Patient utilized PRN Ativan 2 mg at 2054, Bentyl 20 mg at 2057, and Trazodone 75 mg at 2228 for insomnia with a positive effect.  Has since been comfortably sleeping without any incidents.  Will continue to monitor. Staff monitoring for safety and support is ongoing and Patient is able to maintain safety.

## 2016-12-22 NOTE — Plan of Care (Signed)
Problem: Addiction to alcohol or opioids or other substances AS EVIDENCED BY:  Goal: Patient achieves a safe detoxification and management of withdrawal symptoms  Outcome: Progressing   12/22/16 1242   Goal/Interventions addressed this shift   Patient achieves a safe detoxification and management of withdrawal symptoms Assess withdrawal signs/symptoms according to identified protocol (e.g. CIWA/COWS);Provide medication teaching including name, dosage, benefits, action, effect and side effects;Assess effectiveness (relief of withdrawal symptoms) and side effects of medication;Educate about health risks associated with withdrawal (e.g. seizures, DTs);Ensure adequate hydration and nutrition during withdrawal;Educate about the importance of reporting dizziness or unsteadiness while ambulating to care providers (e.g. RN, LIP)       Sarabelle has been quiet on the unit during the morning. She has reported feeling anxious and has obvious tremors in her arms and hands. Her CIWAs were 4, 4, and 8. Pt received 2 mg Ativan at 1221. Pt reported no pain and is still awaiting a plan for aftercare. Pt is currently safe on the unit denies any SI/HI currently and has eaten meals with peers. RN will continue to monitor for safety and symptom management.

## 2016-12-22 NOTE — Progress Notes (Signed)
Cing has been very quiet but cooperative today during the shift. She did not attend groups but has been out and active on the unit safely and ate meals with peers. CIWAS for the afternoon were 6, 10, and 8. Pt is very tremulous and reports anxiety. HR has been elevated for most of the day; physician was informed and pt received Inderal 20mg  at 1839. Pt also received Ativan 2mg  at 1618. Currently denies any SI/HI. This RN will report pt's concerns about her tremors for the oncoming shift in order to continue to monitor safety and withdrawal symptom management.

## 2016-12-22 NOTE — H&P (Signed)
ADMISSION HISTORY AND PHYSICAL EXAM    Date Time: 12/22/16 9:35 AM  Patient Name: Erin Huber  Attending Physician: Jacqualyn Posey, MD    Assessment:   Alcohol use disorder  Alcohol withdrawal   Nicotine dependence   Anxiety  Depression       Plan:   Librium protocol for alcohol withdrawal using the CIWA scale  PRN trazodone for insomnia   Nicotine replacement  Counseling about smoking cessation    Motivational interviewing  Substance abuse counseling on the disease of addiction and relapse prevention  Aftercare: TBD       History of Present Illness:   Erin Huber is a 31 y.o. female who presents to the hospital with alcohol withdrawal  Patient was admitted to [facility] inpatient detox unit after medically cleared by the ER  Patient reports drinking usually 8-10 shots of liquor, a bottle of wine, and 3-6 hard lemonades dailyg   Patient denies any withdrawal seizures or hallucinations but admits to blockout  Patient is self motivated to treatment by the impact of alcohol on her quality of life        Past Medical History:   History reviewed. No pertinent past medical history.    Past Surgical History:   History reviewed. No pertinent surgical history.    Family History:     Family History   Problem Relation Age of Onset   . Cancer Maternal Grandmother        Social History:     Social History     Social History   . Marital status: Single     Spouse name: N/A   . Number of children: N/A   . Years of education: N/A     Social History Main Topics   . Smoking status: Current Every Day Smoker     Packs/day: 1.00     Types: Cigarettes   . Smokeless tobacco: Never Used   . Alcohol use Yes      Comment: Drinks 8-10 shots of liquor, 1 bottle of wine and 3-6 hard llemonades daily   . Drug use: No   . Sexual activity: Yes     Partners: Male     Other Topics Concern   . Not on file     Social History Narrative   . No narrative on file       Allergies:   No Known Allergies    Medications:     No prescriptions prior to  admission.       Review of Systems:   A comprehensive review of systems was:    Psychological ROS: positive for - anxiety  Respiratory ROS: no cough, shortness of breath, or wheezing  Cardiovascular ROS: no chest pain or dyspnea on exertion  Gastrointestinal ROS: no abdominal pain, change in bowel habits, or black or bloody stools  Neurological ROS: positive for - tremors    Physical Exam:     Vitals:    12/22/16 0801   BP: 114/76   Pulse: 71   Resp: 18   Temp: 98.6 F (37 C)   SpO2: 98%       Intake and Output Summary (Last 24 hours) at Date Time  No intake or output data in the 24 hours ending 12/22/16 0935       General Appearance:    Alert, cooperative, no distress, appears stated age   Head:    Normocephalic, without obvious abnormality, atraumatic   Eyes:    PERRL, conjunctiva/corneas clear,  EOM's intact,        Ears:    Normal TM's and external ear canals, both ears   Nose:   Nares normal, septum midline, mucosa normal, no drainage    or sinus tenderness   Throat:   Lips, mucosa, and tongue normal; teeth and gums normal   Neck:   Supple, symmetrical, trachea midline, no adenopathy;        thyroid:  No enlargement/tenderness/nodules; no carotid    bruit or JVD     Mental Status Exam:   Appearance: No apparent distress  Behavior/relationship to examiner/demeanor:  Cooperative and Landscape architect activity/EPS:  Normal  Gait:   Normal  Speech rate:  Normal  Speech volume:   Normal  Speech articulation:   Normal  Speech coherence:   Normal  Speech spontaneity:   Normal  Mood (subjective report):   euthymic  Affect (objective appearance):  Appropriate/mood-congruent  Thought Process (Associations):   Logical  Thought process (Rate):   Normal  Thought content:   No evidence of suicidal or homicidal ideation  Abnormal Perception:  None  Sensorium:   Alert  Attention/Concentration: Normal  Memory:   Immediate recall intact  Computation:   Intact  Language:  Intact  Fund of Knowledge/Intelligence:    Average  Abstraction:   Normal  Insight:   Fair  Judgment:   Fair        Clinical Diagnosis:   Alcohol use disorder  Alcohol withdrawal   Nicotine dependence   Anxiety  Depression     This patient has been informed of their individual treatment plan.  This includes an explanation of the action of all prescribed psychoactive medications.    List of Prescribed Psychoactive Medications (Antidepressants, Mood Stabilizers, Antipsychotics, Antianxiety, Stimulants):   Librium, trazadone,    The benefits, potential side effects, risks, and possible drug interactions were explained to this patient who indicated that she understood and agrees to the plan of care.   All patient questions were answered.          Labs:     Results     Procedure Component Value Units Date/Time    Beta HCG, Sharene Butters, Serum [[id number]] Collected:  12/22/16 0558     Updated:  12/22/16 0900     hCG, Sharene Butters. <1.2    Hepatitis B (HBV) Surface Antigen [[id number]] Collected:  12/22/16 0558    Specimen:  Blood Updated:  12/22/16 0828     Hepatitis B Surface AG Non-Reactive    Hepatitis C (HCV) antibody, Total [[id number]] Collected:  12/22/16 0558    Specimen:  Blood Updated:  12/22/16 0828     Hepatitis C, AB Non-Reactive    HIV Ag/Ab 4th generation [[id number]] Collected:  12/22/16 0558     Updated:  12/22/16 0828     HIV Ag/Ab, 4th Generation Non-Reactive    Basic metabolic panel [[id number]]  (Abnormal) Collected:  12/22/16 0558    Specimen:  Blood Updated:  12/22/16 0815     Glucose 90 mg/dL      BUN 4.0 (L) mg/dL      Creatinine 0.7 mg/dL      Calcium 9.3 mg/dL      Sodium 376 (L) mEq/L      Potassium 4.5 mEq/L      Chloride 106 mEq/L      CO2 20 (L) mEq/L     GGT [[id number]]  (Abnormal) Collected:  12/22/16 0558     Updated:  12/22/16 0815     Gamma  Gluten Transferase 1,393 (H) U/L     ALT [[id number]] Collected:  12/22/16 0558    Specimen:  Blood Updated:  12/22/16 0815     ALT 47 U/L     Albumin [[id number]]  (Abnormal) Collected:  12/22/16 0558     Specimen:  Blood Updated:  12/22/16 0815     Albumin 2.9 (L) g/dL     Magnesium [[id number]]  (Abnormal) Collected:  12/22/16 0558    Specimen:  Blood Updated:  12/22/16 0815     Magnesium 1.4 (L) mg/dL     AST [[id number]]  (Abnormal) Collected:  12/22/16 0558    Specimen:  Blood Updated:  12/22/16 0815     AST (SGOT) 130 (H) U/L     Hemolysis index [[id number]] Collected:  12/22/16 0558     Updated:  12/22/16 0815     Hemolysis Index 10    GFR [[id number]] Collected:  12/22/16 0558     Updated:  12/22/16 0815     EGFR >60.0    Lipid panel [[id number]]  (Abnormal) Collected:  12/22/16 0558    Specimen:  Blood Updated:  12/22/16 0809     Cholesterol 236 (H) mg/dL      Triglycerides 106 (H) mg/dL      HDL 17 (L) mg/dL      LDL Calculated 269 (H) mg/dL      VLDL Cholesterol Cal 32 mg/dL      CHOL/HDL Ratio 48.5    RPR (Reflex to Titer and Confirmation) [[id number]] Collected:  12/22/16 0558     Updated:  12/22/16 0753    CBC without differential [[id number]] Collected:  12/22/16 0558    Specimen:  Blood from Blood Updated:  12/22/16 0748          Signed by: Jacqualyn Posey

## 2016-12-22 NOTE — Plan of Care (Signed)
Problem: Addiction to alcohol or opioids or other substances AS EVIDENCED BY:  Goal: Patient's recovery goal in his/her own words:  BPS Note:   Pt did not attend community group this morning. This counselor met with pt 1:1 to complete BPS interview. Pt presented as anxious, soft-spoken but with good eye contact, cooperative, open and engaged. Pt reported feeling "anxious", which she attributed to withdrawal symptoms and being in a new and unfamiliar environment. Although boyfriend drinks regularly, pt reported he is willing to have a sober home and wants to be sober himself; pt stated he has committed to "doing this together". Pt was oriented x4. Pt reports the precipitating event for seeking treatment as "I can't seem to control my drinking anymore".  Pt denies current/past SI/HI. Patient reports current living situation is [facility] [place] for 2 years and that she lives with her boyfriend who is supportive but also drinks regularly. Patient describes current social relationships as supportive, has been with boyfriend almost 3 years, he has agreed to stop drinking; sister "hates it"; mom is very supportive and "an enabler"-- she will buy alcohol for her. Pt's employment status and impact on work/school.      Pt reports the following aftercare plan as unsure.  Pt presents in a contemplative stage of change as evidenced by willingness to seek treatment and desire to stop drinking ("I need to stop drinking"); however has limited insight into her disease and is struggling with the concept of alcoholism. Pt was assessed and provided with appropriate treatment materials including a "What to Expect at [facility]" handout, a "Step One" task and a "My Personal Journal" workbook. Pt's treatment will focus on participation in groups to facilitate sense of community and connection, self-diagnosis and increased insight, development of sober support, and aftercare planning.    Transition  Planning Note: Counselor and pt discussed  plan for aftercare following discharge. Pt is currently undecided on aftercare plans. Pt was provided information on levels of care (residential, PHP, IOP) which pt agreed to consider and discuss with family.     Family Interview Note: Pt declined; will consider inviting family members to family group.

## 2016-12-22 NOTE — Progress Notes (Signed)
Initial Nursing Assessment/Intake  Date:  12/22/2016  Time: 11:00 AM  Erin Huber [id number]:  A 31 y.o. female was assessed, for admission to [facility].    Reason:  Erin Huber has been experiencing daily alcohol use; initially she said for a few months and then changed her daily use to the past few years.    Onset & Duration:  Past few years but also use has had consequences since her teen years    Current BAL/UDS:    Common Labs 12/22/2016   Breathalyzer Positive/Negative: Community education officer results -   Drug Screen Positive/Negative: Negative   Screen Positive/Negative: Negative        Current V/S:    Vitals:    12/22/16 0959   BP: 115/82   Pulse: 81   Resp: 12   Temp:    SpO2: 98%       Current known Allergies:  Patient has no known allergies.    Patient is referred by her sister who she says is a [facility] alumnus, admitted from the ED.    Problem and Patient Goal  Presenting Problem: alcohol use disorder  What made you decide to come in for treatment at this time?  : I can't seem to control my drinking anymore.  What is your perception of your mental health/substance use issue?: Sometimes I feel I am an alcoholic; other times it is more of a wanting than a needing.  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 [employer]; coworkers have smelled alcohol on her at work; a couple months ago her Production designer, theatre/television/film sent her home for a month because she messed up 2 orders and went through her purse and found mini bottles at work; she has snuck drinks at work; she does not think her job is secure  Who is the EAP  Representative involved in your referral?: n/a  How has your MH/SA affected your financial status?: struggles; she spends $30.00 a day on alcohol  How has your MH/SA affected your relationships?: her boyfriend drinks alcohol and calls her an alcoholic; she thinks he has a problem too; they have been together almost 3 years; my sister hates it; mom is very supportive and "an enabler"-she will buy alcohol for her  Drug/Alcohol Related Charges and Other Arrests: DIP at 31 yo; underage possession of alcohol at age 48; DUI at age 48  Do you have any recent legal concerns?: no  Probation/Parole Officer Name (if applicable): n/a    HOH:  Erin Huber is not Hearing Impaired  Special needs paperwork completed? No    Belongings searched?  Yes.      History:   Past Psychiatric History:   Previous diagnoses:  Yes ADHD  Previous suicide attempts:  No  Current risk (TASR- risk assessment):    Tool for Assessment of Suicide Risk  Individual Risk Profile: poor social support, social isolation  Symptom Risk Profile: anxiety  Interview Risk Profile: recent substance abuse  Protective Factors: employed, supportive significant other, treatment  adherent  Level of Suicide Risk: Low     Self-injurious behavior/risky behavior:  Self-Harm Assessment  Self-Harm History: No  Family Suicide History: No  Deterrents: See progress notes (call someone)  Triggers: None known    Substance Abuse History:  Detailed Drug Use History  Any current alcohol and/or drug use?: Yes  Reason for Alcohol or Drug Use: "I can. I get bad anxiety."  Alcohol Use: In Lifetime, In Past Twelve Months             Age first used alcohol:  : 10            Max used in a day: unsure            Pattern of use:: she drinks 8 to 10 shots plus 3 hard lemonades a day starting in the am for the past few years; prior to that she drank every other day a beer and a shot; in high school she drank on the weekends which resulted in legal issues             Last use:: 12/21/2016             Longest Abstinence:  : none in the past few years  Tranquilizers/Benzodiazepines:: Valium once over 10 years ago-ex-boyfriend gave it to her  Tranquilizers/Benzodiazepines #2:: n/a  Tranquilizers/Benzodiazepines #3: n/a  Heroin Use: N/A  Rx Opiates #1: : oxy-prescribed after a tooth extraction; denies abuse or regular use  Rx Opiates #2: : n/a  Rx Opiates #3: : n/a  Opiates Use #4:: n/a  Opiates Use #5:: n/a  Opiates Use #6:: n/a  Non-Rx Methadone Use: N/A  Amphetamine Use: In Lifetime, Oral, Nasal            Age first used amphetamine:: 6            Years of use:: 31 yo to 31 yo            Pattern of use:: prescribed ritalin which changed to adderall in 9th grade; she abused it starting age 50 taking more or snorting it and than drinking to come down            Last use:: 12/2013  Marijuana Use: In Lifetime, Smoked            Age first used Marijuana:: 13            Years of use:: 0            Pattern of use:: social use            Last use:: years ago  Cocaine Use: In Lifetime, Nasal            Age first used cocaine:: 21            Years of use:: 0            Pattern of use:: she used it sometimes            Last use:: years ago  Hallucinogens Use: N/A  Inhalants Use: In Lifetime (Duster once at age 13)  PCP Use: N/A  Nicotine Use: In Lifetime, Smoked            Age first used nicotine:: 13            Years of use:: 18 years            Max used in a day: 1.5 ppd  Pattern of use:: 1/2 ppd            Last use:: 12/21/2016            Longest Abstinence:  : none; she never tried  Barbiturates Use: N/A  Other Substance Abuse: ecstacy once  Other Substance Abuse #2:: denies  Other Substance Abuse #3:: denies  Caffeine use:: in the past; not now  Legal consequences of chemical/alcohol use?   Yes DIP, underage possession of alcohol and DUI  Use of OTC medications:   No  Additional historical information includes:  I met with Erin Huber this morning to complete her admission assessments.  She was pleasant and  cooperative with this process.  She says her sister helped her to get into [facility] because her sister was at [facility] in the past for heroin use. This is her first treatment. She is in danger of losing her job.  She lives with her boyfriend who is also a drinker.  Withdrawal Symptoms  Current Withdrawal Symptoms: Anxiety, Tremors, Fatigue  Past Withdrawal Symptoms: Anxiety, Tremors, Nausea/vomiting, Fatigue, Cravings, Sweats, Headache  History of Hallucinations?: 0  History of Withdrawal Seizures?: No  History of DT's?: No  History of Blackouts?: Yes  When were blackouts?: couple times a week     Review Of Systems:   Medical Review Of Systems:  General Medical  Current medical conditions:: elevated liver enzymes, recent gout, elevated lipids  When was the last time you consulted with your physician? : couple weeks ago  Are you currently working with a psychiatrist? : No  Do you use any alternative therapies?: No  Have you had a blood test to determine if you have Hepatitis?: Yes  Have you had a vaccination for Hepatitis A&B?: Yes    Current Evaluation:   Presenting Mental Status  Orientation Level: Oriented X4  Memory: Recent memory impaired  Thought Content: normal  Thought Process: normal  Behavior: restless  Consciousness: Alert  Impulse Control: normal  Perception: normal  Eye Contact: normal  Attitude: cooperative  Mood: anxious  Hopelessness Affects Goals: No  Hopelessness About Future: No  Affect: normal  Speech: soft  Concentration: normal  Insight: fair  Judgment: fair  Appearance: normal  Appetite: decreased  Weight change?: decreased  Weight Loss (Pounds): 9#  Period of Time: past month  Energy: normal  Sleep: difficulty falling asleep  Reliability of Reporter/Patient: fair  Stage of change for patient to address presenting MH/SA:  : Contemplation  Stage of change choice is evidenced by? : she is struggling with the concept of alcoholism-having a want to drink vs a need to drink     Sleep  Sleep Pattern:  Difficulty falling asleep, Disturbed/interrupted sleep, Restlessness, Early awakening  Average Number of Sleep Hours: 7 Hours  Restful Sleep: No (Comment) (yes and no)  Difficulty Falling Asleep: Yes (Comment)  Difficulty Staying Asleep: Yes (Comment)  Difficulty Arising: No  Use of Sleep Aids: no    Appetite:  Nutritional Status:  Eats fewer than 2 meals per day?: Yes  Poor appetite 7 days prior to admission?: Yes  Food intolerance/cultural preference?: No  Eating disorder?: No  Impaired chewing or swallowing?: No  Unintentional 10 lb loss/gain in past month: 0  Any special dietary requirement?: No  How would you describe your appetite in the past month?: I snack; such as eating out of a jar of olives    Physical/Somatic Complaints Pain:  Chronic Pain Issues  Physical Pain: no    Assessment -  Diagnosis - Goals:   Preliminary Diagnosis (DSM IV)  Axis I: alcohol use disorder  Axis II: deferred  Axis III: see medical chart  Axis IV: Primary support group, Social environment, Economic, Occupational  Axis V on Admission: 35  Axis V - Highest in Past Year: unknown    Treatment Plan/Recommendations: Ativan Protocol  Admit to [facility] Under the care of Dr Derryl Harbor.    [employer] was called. Pre-authorization pending.    Gypsy Lore RN

## 2016-12-23 DIAGNOSIS — K709 Alcoholic liver disease, unspecified: Secondary | ICD-10-CM

## 2016-12-23 DIAGNOSIS — F17210 Nicotine dependence, cigarettes, uncomplicated: Secondary | ICD-10-CM

## 2016-12-23 LAB — HEMOLYSIS INDEX: Hemolysis Index: 13 (ref 0–18)

## 2016-12-23 LAB — MAGNESIUM: Magnesium: 1.7 mg/dL (ref 1.6–2.6)

## 2016-12-23 NOTE — Plan of Care (Signed)
Problem: Addiction to alcohol or opioids or other substances AS EVIDENCED BY:  Goal: Patient educated about the disease of addiction and recovery and identifies long-term goal  Counseling Note: Pt did not attend community group this morning and was observed resting in bed.  Counselor later met with the pt 1:1, and pt presented as anxious, soft-spoken but maintained good eye contact, polite and cooperative, but guarded. Pt stated her goal for today is to "learn more and be more educated" and pt expressed a desire to go to more groups; pt was disappointed that she accidentally slept through morning groups. Counselor and pt discussed pt's needs in recovery, and pt acknowledged the need for continued support and structure; pt agreed to discuss aftercare with her family, who are highly supportive of recovery. Counselor provided emotional support and encouraged pt to continue engaging in treatment and to solidify aftercare plans.      Transition  Planning Note: Counselor and pt discussed plan for aftercare following discharge. Pt is considering [facility] and is discussing transportation concerns with mom and sister.

## 2016-12-23 NOTE — Progress Note - Problem Oriented Charting Notewrit (Signed)
PROGRESS NOTE    Date Time: 12/23/16 9:07 AM  Patient Name: Erin Huber      Assessment:   Alcohol use disorder  Alcohol withdrawal   Alcohol liver disease   Nicotine dependence   Anxiety  Depression   Anemia  Thrombocytopenia     Plan:   Librium protocol for alcohol withdrawal using the CIWA scale  PRN trazodone for insomnia   Nicotine replacement  Counseling about smoking cessation  Repeat LFTs and CBC tomorrow     Motivational interviewing  Substance abuse counseling on the disease of addiction and relapse prevention  Aftercare: TBD     Subjective:   Anxiety, tremor, motivated to stop drinking, attending groups     Medications:     Current Facility-Administered Medications   Medication Dose Route Frequency   . allopurinol  100 mg Oral Daily   . famotidine  20 mg Oral BID   . folic acid  1 mg Oral Daily   . magnesium oxide  400 mg Oral BID   . multivitamin  1 tablet Oral Daily   . nicotine  1 patch Transdermal Daily   . sucralfate  1 g Oral TID AC & HS   . thiamine  100 mg Oral Daily   . tuberculin  0.1 mL Intradermal Once       Review of Systems:   A comprehensive review of systems was:    Psychological ROS: positive for - anxiety  Respiratory ROS: no cough, shortness of breath, or wheezing  Cardiovascular ROS: no chest pain or dyspnea on exertion  Gastrointestinal ROS: no abdominal pain, change in bowel habits, or black or bloody stools  Neurological ROS: positive for - tremors    Physical Exam:     Vitals:    12/23/16 0803   BP: 108/76   Pulse: 88   Resp: 16   Temp: 98.4 F (36.9 C)   SpO2: 97%       Intake and Output Summary (Last 24 hours) at Date Time  No intake or output data in the 24 hours ending 12/23/16 0907       General Appearance:    Alert, cooperative, no distress, appears stated age   Head:    Normocephalic, without obvious abnormality, atraumatic   Eyes:    PERRL, conjunctiva/corneas clear, EOM's intact,        Ears:    Normal TM's and external ear canals, both ears   Nose:   Nares normal,  septum midline, mucosa normal, no drainage    or sinus tenderness   Throat:   Lips, mucosa, and tongue normal; teeth and gums normal   Neck:   Supple, symmetrical, trachea midline, no adenopathy;        thyroid:  No enlargement/tenderness/nodules; no carotid    bruit or JVD       Labs:     Results     Procedure Component Value Units Date/Time    Magnesium [[id number]] Collected:  12/23/16 0550    Specimen:  Blood Updated:  12/23/16 0846     Magnesium 1.7 mg/dL     Hemolysis index [[id number]] Collected:  12/23/16 0550     Updated:  12/23/16 0846     Hemolysis Index 13    RPR (Reflex to Titer and Confirmation) [[id number]] Collected:  12/22/16 0558     Updated:  12/22/16 1524     RPR Non Reactive    CBC without differential [[id number]]  (Abnormal) Collected:  12/22/16 6295  Specimen:  Blood from Blood Updated:  12/22/16 0949     WBC 4.10 x10 3/uL      Hgb 10.7 (L) g/dL      Hematocrit 62.1 (L) %      Platelets 110 (L) x10 3/uL      RBC 2.60 (L) x10 6/uL      MCV 122.3 (H) fL      MCH 41.2 (H) pg      MCHC 33.6 g/dL      RDW 14 %      MPV 12.9 (H) fL      Nucleated RBC 0.0 /100 WBC      Absolute NRBC 0.00 x10 3/uL             Rads:   Radiological Procedure reviewed.    Signed by: Jacqualyn Posey

## 2016-12-23 NOTE — Plan of Care (Signed)
Problem: Addiction to alcohol or opioids or other substances AS EVIDENCED BY:  Goal: Patient achieves a safe detoxification and management of withdrawal symptoms  Outcome: Progressing      Comments: Patient is alert and orientedx4.  Patient was using phone this evening.  Patient is ambulatory.  Patient is anxious and has tremors; continue to monitor CIWA every 4 hours and medicate with PRN ativan and PRN vistaril.  Patient has recently received these medications from dayshift.  Educated patient on coping skills and encouraged rest.  Continue to monitor for safety; rounding.

## 2016-12-23 NOTE — Progress Notes (Signed)
Pt is here for alcohol detox; pt continues to be very tremulous and reports anxiety.  Pt received ativan at 1800; pt attended groups today and interacted with her peers;

## 2016-12-23 NOTE — Plan of Care (Signed)
Problem: Addiction to alcohol or opioids or other substances AS EVIDENCED BY:  Goal: Patient achieves a safe detoxification and management of withdrawal symptoms  Luverna was medicated at 2336 with Trazodone 75 mg po for sleep and at 0004 was given Ativan 2 mg po for c/o tremors and anxiety. She went to bed after this and has been sleeping well as observed on Q 30 minute rounds. Alanis was very tremulous and diaphoretic when assessed at 0400. She was medicated with Ativan 2 mg po at 0424 and was able to go back to sleep without further c/o. Will continue to monitor and support.

## 2016-12-23 NOTE — Progress Notes (Addendum)
Pt here for alcohol detox; Pt medicated per ativan protocol using the CIWA; Pt medicated at 900 and 1400 per physician order; she attended groups today; will continue to monitor

## 2016-12-23 NOTE — Plan of Care (Signed)
Problem: Addiction to alcohol or opioids or other substances AS EVIDENCED BY:  Goal: Patient develops a discharge plan  Outcome: Progressing  Case Management Note: SW and sw intern met with pt 2:1 in sw's office. Pt reports being amenable to making aftercare plans. Pt was educated on outpatient options offered by [facility]. Pt reports she needs to figure out her transportation for continued care, and will speak to her mother and sister and then touch base with sw.

## 2016-12-23 NOTE — Plan of Care (Signed)
Problem: Addiction to alcohol or opioids or other substances AS EVIDENCED BY:  Goal: Patient achieves a safe detoxification and management of withdrawal symptoms  Latesha is now on day 2 of the detox protocol. She has been actively participating in unit activities and socializing with her peers. Jerrine continues to have withdrawal symptoms and was medicated at 2010 with Ativan 2 mg po for c/o anxiety, tremors and restlessness. Will continue to assess.

## 2016-12-23 NOTE — Progress Notes (Signed)
Patient was seen with lights off in TV room lying on a female patient; lights were turned on and unit supervisor [employer] was notified.  Both patients were spoken to about rules/boundaries and touching was not acceptable; verbalized understanding.  Lights to remain on in TV room; will monitor for any inappropriate behaviors.

## 2016-12-24 LAB — HEPATIC FUNCTION PANEL
ALT: 36 U/L (ref 0–55)
AST (SGOT): 77 U/L — ABNORMAL HIGH (ref 5–34)
Albumin/Globulin Ratio: 0.9 (ref 0.9–2.2)
Albumin: 2.9 g/dL — ABNORMAL LOW (ref 3.5–5.0)
Alkaline Phosphatase: 118 U/L — ABNORMAL HIGH (ref 37–106)
Bilirubin Direct: 0.6 mg/dL — ABNORMAL HIGH (ref 0.0–0.5)
Bilirubin Indirect: 0.4 mg/dL (ref 0.0–1.0)
Bilirubin, Total: 1 mg/dL (ref 0.1–1.2)
Globulin: 3.1 g/dL (ref 2.0–3.7)
Protein, Total: 6 g/dL (ref 6.0–8.3)

## 2016-12-24 LAB — CBC
Absolute NRBC: 0 10*3/uL
Hematocrit: 31.4 % — ABNORMAL LOW (ref 37.0–47.0)
Hgb: 10.6 g/dL — ABNORMAL LOW (ref 12.0–16.0)
MCH: 40.5 pg — ABNORMAL HIGH (ref 28.0–32.0)
MCHC: 33.8 g/dL (ref 32.0–36.0)
MCV: 119.8 fL — ABNORMAL HIGH (ref 80.0–100.0)
MPV: 12.2 fL (ref 9.4–12.3)
Nucleated RBC: 0 /100 WBC (ref 0.0–1.0)
Platelets: 141 10*3/uL (ref 140–400)
RBC: 2.62 10*6/uL — ABNORMAL LOW (ref 4.20–5.40)
RDW: 13 % (ref 12–15)
WBC: 5.71 10*3/uL (ref 3.50–10.80)

## 2016-12-24 LAB — FOLATE: Folate: 15.1 ng/mL

## 2016-12-24 LAB — VITAMIN B12: Vitamin B-12: 732 pg/mL (ref 211–911)

## 2016-12-24 LAB — HEMOLYSIS INDEX: Hemolysis Index: 2 (ref 0–18)

## 2016-12-24 NOTE — Plan of Care (Signed)
Erin Huber participated into all the groups and unit activities. Treatment plan was discussed and signed.   For CIWA assessments, she c/o anxiety, tremors and sweats. Ativan 1 mg PO was given at 1421. She also c/o dizziness but denied trouble with walking. Medication side effect discussed. Fluids encouraged.  Will continue to monitor and report to coming shift.

## 2016-12-24 NOTE — Plan of Care (Signed)
Patient fell asleep around 23:00; received trazodone PRN this evening.  Patient received ativan 1mg  thus far on shift for withdrawal symptoms; primarily anxiety and tremors.  Patient was cooperative with obeying rules/boundaries of unit after [employer] supervisor reinforced these boundaries especially regarding female peers.  Continue to monitor CIWA every 4 hours.

## 2016-12-24 NOTE — Progress Note - Problem Oriented Charting Notewrit (Signed)
PROGRESS NOTE    Date Time: 12/24/16 9:48 AM  Patient Name: Erin Huber      Assessment:   Alcohol use disorder  Alcohol withdrawal   Alcohol liver disease   Nicotine dependence   Anxiety  Depression   Anemia  Thrombocytopenia     Plan:   Librium protocol for alcohol withdrawal using the CIWA scale  PRN trazodone for insomnia   Nicotine replacement  Counseling about smoking cessation  Check LFTs and CBC tomorrow     Educate about Vivitrol treatment   Motivational interviewing  Substance abuse counseling on the disease of addiction and relapse prevention  Aftercare: TBD     Subjective:   Anxiety, tremor, motivated to stop drinking, attending groups     Medications:     Current Facility-Administered Medications   Medication Dose Route Frequency   . allopurinol  100 mg Oral Daily   . famotidine  20 mg Oral BID   . folic acid  1 mg Oral Daily   . multivitamin  1 tablet Oral Daily   . nicotine  1 patch Transdermal Daily   . sucralfate  1 g Oral TID AC & HS   . thiamine  100 mg Oral Daily   . tuberculin  0.1 mL Intradermal Once       Review of Systems:   A comprehensive review of systems was:    Psychological ROS: positive for - anxiety  Respiratory ROS: no cough, shortness of breath, or wheezing  Cardiovascular ROS: no chest pain or dyspnea on exertion  Gastrointestinal ROS: no abdominal pain, change in bowel habits, or black or bloody stools  Neurological ROS: positive for - tremors    Physical Exam:     Vitals:    12/24/16 0933   BP: 111/76   Pulse: (!) 101   Resp: 18   Temp:    SpO2: 96%       Intake and Output Summary (Last 24 hours) at Date Time  No intake or output data in the 24 hours ending 12/24/16 0948       General Appearance:    Alert, cooperative, no distress, appears stated age   Head:    Normocephalic, without obvious abnormality, atraumatic   Eyes:    PERRL, conjunctiva/corneas clear, EOM's intact,        Ears:    Normal TM's and external ear canals, both ears   Nose:   Nares normal, septum midline,  mucosa normal, no drainage    or sinus tenderness   Throat:   Lips, mucosa, and tongue normal; teeth and gums normal   Neck:   Supple, symmetrical, trachea midline, no adenopathy;        thyroid:  No enlargement/tenderness/nodules; no carotid    bruit or JVD       Labs:     Results     Procedure Component Value Units Date/Time    Hepatic function panel (LFT) [[id number]] Collected:  12/24/16 0922    Specimen:  Blood Updated:  12/24/16 0946    CBC without differential [[id number]] Collected:  12/24/16 0922    Specimen:  Blood from Blood Updated:  12/24/16 0946            Rads:   Radiological Procedure reviewed.    Signed by: Jacqualyn Posey

## 2016-12-24 NOTE — Discharge Instructions (Addendum)
[facility] Continuing Care Plan, including the After Visit Summary (AVS) and the Physician's Discharge Summary can be viewed by anyone who has access to Epic.    RECOMMENDED THAT YOU COMPLY WITH THE FOLLOWING PLAN:   Follow up appointment with: [place] [facility] Date: 12/29/2016      Time: 08:00 a.m.  Location:   [address], [place] , [address]  P: [phone]  F: [phone]       FOR EMERGENCY MENTAL HEALTH or Substance Abuse Appointment, CONTACT [facility] at [phone].    Community Services Board:   Counselling psychologist  Phone Number: [phone]   Address: [address] [place], [place] [address] [place], Geographical information systems officer free in the garage, level [address]    [employer] Suicide Prevention Lifeline :  [phone]    The following were reviewed with patient/family by Mariel Aloe RN    1. Reason for IP admission  2. Major procedures and tests, including summary of results  3. Diagnosis at discharge  4. Current medication list  5. Were studies pending at discharge?     No                                         6. Patient instructions;  Adult Wellness, Recovery, and Safety Plan  7. Emergency contact information  8. Plan for follow up care   9. Name of provider(s), appointment(s), and location(s) of follow up care.                                              Advance Care Plan  10. Patient had a medical and mental health Advance Directive at admission. No: If patient did not have a medical and mental health Advance Directive at admission:  information about completing Advance Directives or designating a surrogate decision maker, and a form, was provided.                                                                                11. After receiving information- Did patient create a medical and mental health Advance Directive or appoint a surrogate decision maker?    No   Other- would like to complete at another time                                                                                                              Tobacco Cessation Discharge Referral for Evidence Based Counseling:   (group occurs every Tuesday morning  at 11:00am at the [facility] Floor 7 Conference Room)    Follow up appointment with:  [facility]   Date: 12/30/2016      Time: 11:00am  Location:     [facility] Floor 7 Conference Room- [address] [place], [place] [address]    P: [phone]

## 2016-12-24 NOTE — Plan of Care (Addendum)
Problem: Addiction to alcohol or opioids or other substances AS EVIDENCED BY:  Goal: Patient educated about the disease of addiction and recovery and identifies long-term goal  Goal Details: Interventions:   1. Educate about the characteristics of alcohol/substance abuse/dependency   2. Assist patient to identify coping strategies to use as alternatives to alcohol/substance abuse/dependency   3. Encourage and monitor participation in all unit a ctivities   4. Encourage participation in groups and 12 step meetings while on the unit   5. Provide patient with a task and an opportunity to present during group     Counseling Note: Pt attended the community meeting and reported feeling "emotionally scared and physically much better, no shakes". Pt is interacting with peers and was observed encouraging others to attend the NA meeting. Met with pt later and explained role in her treatment pt was anxious and made indirect /variable eye contact. Appeared anxious to return to socializing with peers. Pt shared that she expects a visit from family members and intends to make aftercare decision then. Pt added that she works variable hours and may have difficulty committing to IOP. Will discuss with pt the rationale for continuing care in group setting and during 1:1.    Transition Planning Note: Pt remains ambivalent. Is expecting family members to make decision.

## 2016-12-24 NOTE — Plan of Care (Signed)
Problem: Addiction to alcohol or opioids or other substances AS EVIDENCED BY:  Goal: Patient achieves a safe detoxification and management of withdrawal symptoms  Outcome: Progressing  Erin Huber was assessed per protocol for alcohol withdrawals. She was in her bed during morning assessment and skipped her breakfast. She c/o tremors, anxiety and sweats. She reported having comfortable sleep. Ativan 1 mg Po and scheduled medications given.   Unit group participation encouraged. No distress noticed. Will continue to monitor.

## 2016-12-24 NOTE — Plan of Care (Signed)
Problem: Addiction to alcohol or opioids or other substances AS EVIDENCED BY:  Goal: Patient achieves a safe detoxification and management of withdrawal symptoms  Ailis is on day 4 of the Ativan protocol. She attended the family program and the AA meeting and afterward socialized with her peers. She was medicated at 2023 with Ativan 1 mg po for c/o anxiety and tremors. Tyrina was also given Trazodone 75 mg po for sleep per request at 2135 and went to bed shortly after being medicated. Will continue to assess.

## 2016-12-24 NOTE — Treatment Plan (Signed)
Interdisciplinary Treatment Plan Update Meeting    12/24/2016  Erin Huber    Participants:  Patient:  Erin Huber  Attending Physician:  Jacqualyn Posey, MD  RN: Elnita Maxwell  Mental Health Therapist: Susy Frizzle  Social Worker: Ludger Nutting    Objective:  Review response to treatment, reassess needs/goals, update plan as indicated incorporating patient's strengths and stated needs, goals, and preferences.    Socrates score:    Recognition:34-35 High recognition: Treatment team will assist Klarissa with making aftercare plans. Kellene  is ready to take steps.      Ambivalence:17-20 High ambivalence: Paired with low scores on recognition and taking steps, treatment team will use biological markers and recent life consequences to help Letticia  recognize the impact of substance use in his/her life.     Taking steps: 8-30 Taking Steps Low Score: Treatment team will assist Mickelle  with understanding the biopsychosocial need for detox and with understanding the consequences of continued substance abuse.    1. Summary of Patient Progress on Treatment Plan Goals:  [name] reported feeling shaky today.  Physician explained the need to have her labs monitored once discharged due to high liver enzymes and low platelets.  The treatment team explained the need to arrange aftercare treatment prior to discharge.  Patient reported that she plans to speak with her family this evening to discuss aftercare options.  Patient is scheduled to receive the vivitrol shot prior to discharge.    2. Level of Patient Involvement:  Actively engaged/contributing    3. Patient Understanding of Plan of Care:  Able to verbalize goals and interventions, Able to verbalize discharge criteria    4. Level of Agreement/Commitment to Plan of Care:  Ambivalent about plan of care      Contributor Signatures:      MD_________________________________ Date___________________    SW_________________________________Date ___________________    RN  _________________________________Date____________________    Counselor____________________________Date ___________________

## 2016-12-25 MED ORDER — ALIAS MED D4
50.0000 mg | ORAL_TABLET | Freq: Every day | ORAL | Status: DC
Start: 2016-12-25 — End: 2016-12-26

## 2016-12-25 MED ORDER — HYDROXYZINE PAMOATE 25 MG PO CAPS
25.0000 mg | ORAL_CAPSULE | Freq: Two times a day (BID) | ORAL | 0 refills | Status: DC | PRN
Start: 2016-12-25 — End: 2017-01-15

## 2016-12-25 MED ORDER — TRAZODONE HCL 150 MG PO TABS
75.0000 mg | ORAL_TABLET | Freq: Every evening | ORAL | 0 refills | Status: DC | PRN
Start: 2016-12-25 — End: 2017-02-11

## 2016-12-25 MED ORDER — ALIAS MED E5
380.0000 mg | INTRAMUSCULAR | Status: DC
Start: 2016-12-26 — End: 2016-12-26
  Filled 2016-12-25 (×2): qty 4.2

## 2016-12-25 MED ORDER — NICOTINE 21 MG/24HR TD PT24
1.0000 | MEDICATED_PATCH | Freq: Every day | TRANSDERMAL | 0 refills | Status: DC
Start: 2016-12-25 — End: 2017-05-07

## 2016-12-25 NOTE — Plan of Care (Signed)
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 When she was assessed at 0400 her pulse was 114 and she was given Ativan 1 mg po at 0424 for symptoms of restlessness, tremors and anxiety. Sawsan was able to go back to sleep after being medicated and appears to be resting comfortably. Will continue to monitor and support.

## 2016-12-25 NOTE — Plan of Care (Signed)
Problem: Addiction to alcohol or opioids or other substances AS EVIDENCED BY:  Goal: Patient develops a discharge plan  Outcome: Progressing  Case Management Note: Pt and SW met to discuss aftercare planning. Pt called her mom and sister together with SW and pt has agreed to begin [facility] [phone] on Monday, October 8th, 2018 at 08:00 a.m.

## 2016-12-25 NOTE — Plan of Care (Signed)
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 She received 1 dose of Ativan at 1224 for CIWA of 10. She tolerated all scheduled medications well but declined Revia because she had questions about this medication and she wanted to talk to her physician about it first. She was seen up and about interacting with other patients on the unit and eating lunch in the dining room. Will continue to monitor and support.

## 2016-12-25 NOTE — Plan of Care (Signed)
Problem: Addiction to alcohol or opioids or other substances AS EVIDENCED BY:  Goal: Patient educated about the disease of addiction and recovery and identifies long-term goal  Counseling Note: Pt attended community group this morning and reported feeling "steady; overwhelmed". Pt is working on the "Step One" task and is not ready to present today. Pt shared that she did not have a sponsor, did not make any phone calls, and did  go to a meeting yesterday.  Pt said that she is grateful "to be alive".  Counselor later met with the pt 1:1, and pt presented as anxious, soft-spoken, cooperative but guarded, responding only to direct questions without elaborating. Pt reported feeling "good, okay" and stated she had slept well the night before. Pt acknowledged the need for a solid, structured plan following discharge, and agreed to work on aftercare today with help of SW and counselor. Pt appears somewhat ambivalent about Day Tx, but is amenable to attending due to MD recommendation. Pt is most concerned about transportation challenges, but has support from mom and other family members if she reaches out to them. Pt stated her goal for today is to "make a phone call to mom about aftercare". Counselor provided emotional support and encouraged pt to continue engaging in treatment and to solidify aftercare plans.    Transition  Planning Note: Counselor and pt discussed plan for aftercare following discharge. Pt is scheduled to start [facility] Day [facility] on 12/29/16 at 8:00AM.

## 2016-12-25 NOTE — Progress Note - Problem Oriented Charting Notewrit (Signed)
PROGRESS NOTE    Date Time: 12/25/16 9:31 AM  Patient Name: Erin Huber, Erin Huber      Assessment:   Alcohol use disorder  Alcohol withdrawal   Alcohol liver disease   Nicotine dependence   Anxiety  Depression   Anemia  Thrombocytopenia     Plan:   Ativan protocol for alcohol withdrawal using the CIWA scale  PRN trazodone for insomnia   Nicotine replacement  Counseling about smoking cessation  Start on Revia for relapse prevention     Educate about Vivitrol treatment   Motivational interviewing  Substance abuse counseling on the disease of addiction and relapse prevention  Aftercare: [facility] day treatment and IOP   Discharge on 12/26/2016     Subjective:   Anxiety, tremor and sweating  Medications:     Current Facility-Administered Medications   Medication Dose Route Frequency   . allopurinol  100 mg Oral Daily   . famotidine  20 mg Oral BID   . folic acid  1 mg Oral Daily   . multivitamin  1 tablet Oral Daily   . nicotine  1 patch Transdermal Daily   . sucralfate  1 g Oral TID AC & HS   . thiamine  100 mg Oral Daily   . tuberculin  0.1 mL Intradermal Once       Review of Systems:   A comprehensive review of systems was:    Psychological ROS: positive for - anxiety  Respiratory ROS: no cough, shortness of breath, or wheezing  Cardiovascular ROS: no chest pain or dyspnea on exertion  Gastrointestinal ROS: no abdominal pain, change in bowel habits, or black or bloody stools  Neurological ROS: positive for - tremors    Physical Exam:     Vitals:    12/25/16 0802   BP: 114/77   Pulse: 82   Resp: 16   Temp: 98.6 F (37 C)   SpO2: 97%       Intake and Output Summary (Last 24 hours) at Date Time  No intake or output data in the 24 hours ending 12/25/16 0931       General Appearance:    Alert, cooperative, no distress, appears stated age   Head:    Normocephalic, without obvious abnormality, atraumatic   Eyes:    PERRL, conjunctiva/corneas clear, EOM's intact,        Ears:    Normal TM's and external ear canals, both ears   Nose:    Nares normal, septum midline, mucosa normal, no drainage    or sinus tenderness   Throat:   Lips, mucosa, and tongue normal; teeth and gums normal   Neck:   Supple, symmetrical, trachea midline, no adenopathy;        thyroid:  No enlargement/tenderness/nodules; no carotid    bruit or JVD       Labs:     Results     Procedure Component Value Units Date/Time    Folate [[id number]] Collected:  12/22/16 0558     Updated:  12/24/16 1501     Folate 15.1 ng/mL     Vitamin B12 [[id number]] Collected:  12/22/16 0558     Updated:  12/24/16 1453     Vitamin B-12 732 pg/mL     Hepatic function panel (LFT) [[id number]]  (Abnormal) Collected:  12/24/16 0922    Specimen:  Blood Updated:  12/24/16 1230     Bilirubin, Total 1.0 mg/dL      Bilirubin, Direct 0.6 (H) mg/dL  Bilirubin, Indirect 0.4 mg/dL      AST (SGOT) 77 (H) U/L      ALT 36 U/L      Alkaline Phosphatase 118 (H) U/L      Protein, Total 6.0 g/dL      Albumin 2.9 (L) g/dL      Globulin 3.1 g/dL      Albumin/Globulin Ratio 0.9    Hemolysis index [[id number]] Collected:  12/24/16 0922     Updated:  12/24/16 1230     Hemolysis Index 2    CBC without differential [[id number]]  (Abnormal) Collected:  12/24/16 0922    Specimen:  Blood from Blood Updated:  12/24/16 1209     WBC 5.71 x10 3/uL      Hgb 10.6 (L) g/dL      Hematocrit 13.2 (L) %      Platelets 141 x10 3/uL      RBC 2.62 (L) x10 6/uL      MCV 119.8 (H) fL      MCH 40.5 (H) pg      MCHC 33.8 g/dL      RDW 13 %      MPV 12.2 fL      Nucleated RBC 0.0 /100 WBC      Absolute NRBC 0.00 x10 3/uL             Rads:   Radiological Procedure reviewed.    Signed by: Jacqualyn Posey

## 2016-12-25 NOTE — Plan of Care (Signed)
Erin Huber's heart rate was elevated in the afternoon, so she was medicated with Propanolol and it was effective in reducing her heart rate to 86. She also stated that she was feeling anxious and tremulous so she received a PRN dose of Vistaril. As of yet she doesn't know where she will be going after discharge but mentioned [facility] as one of the places she is considering. Will continue to monitor and support.

## 2016-12-25 NOTE — Plan of Care (Signed)
Problem: Addiction to alcohol or opioids or other substances AS EVIDENCED BY:  Goal: Patient achieves a safe detoxification and management of withdrawal symptoms  Outcome: Progressing   12/25/16 2219   Goal/Interventions addressed this shift   Patient achieves a safe detoxification and management of withdrawal symptoms Assess withdrawal signs/symptoms according to identified protocol (e.g. CIWA/COWS);Provide medication teaching including name, dosage, benefits, action, effect and side effects;Assess effectiveness (relief of withdrawal symptoms) and side effects of medication;Ensure laboratory results are reviewed with the LIP to increase understanding of the medical consequences of addiction;Educate about health risks associated with withdrawal (e.g. seizures, DTs);Ensure adequate hydration and nutrition during withdrawal;Educate about the importance of reporting dizziness or unsteadiness while ambulating to care providers (e.g. RN, LIP)     Patient has had some anxiety this evening. She was treated with Vistril at 2000 for a CIWA of 12. She was encourage to use alternative coping skills which she has done. At 2100 her CIWA dropped to a 6. Her BP is elevated and staff will continue to monitor. At 2230 patient requests a Trazodone for sleep. Patient had no complaints of pain. She was social in the milieu. She is independent with her ADL's and is med/meal compliant. Will treat per protocol if indicated.

## 2016-12-25 NOTE — Plan of Care (Signed)
Problem: Addiction to alcohol or opioids or other substances AS EVIDENCED BY:  Goal: Patient develops a discharge plan  Outcome: Progressing  Case Management Note: [facility]  PATIENT ASSIGNMENT LETTER    Program:       ?Day Treatment                ?IOP             ?RP/SL/OBOT/Phase IV    Patient Name: Erin Huber   MRN: [id number]  DOB: [birth year]  Preferred Phone Number:  [phone]    Insurance Provider: Rosann Auerbach   *If Medicare, ensure doctor/nurse appointment scheduled prior to start of IOP.  Estimated Co-Pay:  *DEDUCTIBLE WILL BE BILLED; DO NOT COLLECT. $0.00 co-pay. Per Blondell Reveal**  *Ensure patient has signed Cost Estimate and aware of deductible and co-pay.    Group Assignment:  Day Treatment  Therapist/Counselor:  Dionicio Stall [phone] ; [address], [address], [place], [address]  Orientation Date and Time: October 8, at 08:00 a.m.  Start Date and Time:  December 29, 2016 at 08:30 a.m.    Admission from: [facility]  Suboxone:  No.  Referral Source: [facility]     Family/Friend Interview:  n/a  ROI Signed:   Interview Status:  Med Clinic Appointment Needs: Vivitrol second shot. Originally scheduled H&P w Dr. Algis Downs for 01/06/2017, but can cancel this due to pt starting Day Tx. Will need second shot on/before 01/26/2017.   Psych or Med Consult:  Pill Counts:   Current Medications:   Other:  In the event of inclement weather, please call our Weather Line at [phone]

## 2016-12-26 NOTE — Discharge Summary (Signed)
DISCHARGE SUMMARY    Date Time: 12/26/16 10:49 AM  Patient Name: Erin Huber  Attending Physician: Jacqualyn Posey, MD    Date of Admission:   12/21/2016    Date of Discharge:   12/26/16    Reason for Admission:   Abnormal clinical finding [R68.89]    Discharge Dx:   Alcohol use disorder  Alcohol withdrawal   Alcohol liver disease   Nicotine dependence   Anxiety  Depression   Anemia  Thrombocytopenia     Hospital Course:   Patient was started on Ativan protocol for alcohol withdrawal using CIWA scale.    She was given prn trazodone for insomnia, provided nicotine replacement, and counseling for smoking cessation.  Motivational interviewing was utilized, and patient was given substance abuse counseling on the disease of addiction and relapse prevention.    Patient was engaged in treatment team meetings and attended group therapy sessions.  Patient was able to maintain safety and stable mood during this hospital course.   Discharge Planning: Patient has intake for outpatient [facility] on Monday and will be staying with her mother over the weekend and throughout [facility] outpatient program.      Physical Examination:   General appearance - oriented to person, place, and time   Mental status - euthymic   Eyes - pupils equal and reactive, extraocular eye movements intact  Ears - bilateral TM's and external ear canals normal  Nose - no septal perforation  Mouth - mucous membranes moist, pharynx normal without lesions  Neck - supple, no significant adenopathy  Chest - clear to auscultation, no wheezes, rales or rhonchi, symmetric air entry  Heart - normal rate, regular rhythm, normal S1, S2, no murmurs, rubs, clicks or gallops  Abdomen - soft, nontender, nondistended, no masses or organomegaly  Neurological - alert, oriented, normal speech, no focal findings or movement disorder noted, screening mental status exam normal, neck supple without rigidity, cranial nerves II through XII intact, motor and sensory grossly normal  bilaterally, normal muscle tone, slight tremulousness  Musculoskeletal - no joint tenderness, deformity or swelling  Extremities - peripheral pulses normal, no pedal edema, no clubbing or cyanosis  Skin - normal      Discharge Medications:     Current Discharge Medication List      START taking these medications    Details   hydrOXYzine (VISTARIL) 25 MG capsule Take 1 capsule (25 mg total) by mouth 2 (two) times daily as needed for Anxiety.  Qty: 30 capsule, Refills: 0    Associated Diagnoses: Abnormal clinical finding      nicotine (NICODERM CQ) 21 MG/24HR Place 1 patch onto the skin daily.  Qty: 28 patch, Refills: 0    Associated Diagnoses: Abnormal clinical finding      traZODone (DESYREL) 150 MG tablet Take 0.5 tablets (75 mg total) by mouth nightly as needed for Sleep.  Qty: 15 tablet, Refills: 0    Associated Diagnoses: Abnormal clinical finding               Discharge Instructions:   Discharge home  Follow-up with [facility] outpatient on Monday  [facility] Counseling  AA/NA  Follow up with PCP  Smoking Cessation Rx/Counseling provided.    I certify that inpatient services are medically necessary for this patient. Please see H&P and MD progress notes for additional information about the patient's course of treatment. For Medicare patients, services provided in accordance with 412.3 and expected LOS to be greater than 2 midnights for Medicare patients.:  Yes              Signed by: Freddrick March, MD      Time:  29 mins.
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
## 2016-12-26 NOTE — Plan of Care (Signed)
Patient slept throughout the night. Her CIWA scores were at 0. She had no complaints of pain or discomfort. Will continue to monitor the patient for changes and treat if indicated.

## 2016-12-26 NOTE — Progress Notes (Signed)
Donata was discharged at around 1100. Pt denied SIHI (current) and met criteria for discharge. She will be following up with [facility] on 12/29/16. D/C paperwork reviewed, signed and given to pt. All belongings returned.

## 2016-12-27 NOTE — Telephone Encounter (Signed)
error 

## 2016-12-29 ENCOUNTER — Ambulatory Visit: Payer: Commercial Managed Care - PPO

## 2016-12-29 VITALS — BP 123/81 | HR 79 | Temp 97.5°F | Resp 18 | Ht 64.0 in | Wt 125.0 lb

## 2016-12-29 DIAGNOSIS — F1994 Other psychoactive substance use, unspecified with psychoactive substance-induced mood disorder: Secondary | ICD-10-CM

## 2016-12-29 DIAGNOSIS — G4709 Other insomnia: Secondary | ICD-10-CM

## 2016-12-29 DIAGNOSIS — R6889 Other general symptoms and signs: Secondary | ICD-10-CM

## 2016-12-29 DIAGNOSIS — F1020 Alcohol dependence, uncomplicated: Secondary | ICD-10-CM | POA: Insufficient documentation

## 2016-12-29 NOTE — Progress Notes (Signed)
Topic: Touch-Base/Round Robin        Goal:  Build Rapport/establish relationship between therapist and patients      Intervention:  person centered      Group Comments/Observations:     Pt attended: Yes  Pt participation: active     Notes:     Suicide Assessment Tool (SAT)    Thoughts (Suicidal Ideation): verbalizes no current ideation  Plans (Suicidal Ideation): none to occasional thoughts of suicide with no plan  Method (Suicidal Ideation): none  Impulse Control (Behavior Cues): adequate impulse control  Behavioral Activity (Behavior Cues): consistent in behavior or patterns  Preparation for Death (Behavior Cues): none  Predominant Mood or Affect: signs of mild depression or indicators of hopefulness  Mood Stability (Mood/Affect): mood stable  Tolerance of Feelings (Mood/Affect): feelings tolerable  Problem Solving (Cognition/Perception): problem solving intact  Perception (Cognition/Perception): accurate perception of reality  Monitoring/Suicide Alert Level: Routine monitoring    Behavioral Observation: cooperative       Therapist casually inquired about patient's present condition. General inquires included how pt was doing in their recovery journey, what their previous evening entailed, how they were progressing with their family and other significant relationships and other concerns that were impacting their sobriety. PACS Checklist was completed as reflected below (Pt's have 6 options to answer only their answer is noted below).     1. During the past 24 hours how often have you thought about how good using drugs or alcohol would make you feel? 2  2. At it's most severe point, how severe was your craving during the past 24 hours? 2  3. During the past 24 hours how much time have you spent thinking about using drugs or alcohol or about how good using drugs or alcohol would make you feel? 1  4. During the past 24 hours how difficult would it have been to resist using drugs or alcohol if you had known some were  in the house? 2  5. Keeping in mind your response to the previous questions, please rate your overall average drugs or alcohol craving in the past 24 hours. 2      Patient responded to questions and engaged in collaborative conversation. Pt shared that she felt nervous and excited. Pt noted coming over from inpatient and that this was her first involvement in a recovery program.  Pt noted wanting to learn more about alcoholism and participate as her goals. Pt reported she was supported by her family.Pt stated her cravings were manageable.  Pt presented in the contemplative stage of change as evidenced by her initial efforts at treatment and early phase of her recovery.      Erin Huber. Odean Fester LPC, MAC, NCC, CSAC

## 2016-12-29 NOTE — Progress Notes (Signed)
Topic: Addictive Disease Process   Goal:  Identify cognitive distortions, Increase mindfulness, Positive behavioral changes  Intervention:  Interactive didactic instruction    Group Comments/Observations:     Pt attended: Yes  Pt participation: active: seemed to listen    Notes:     Behavioral Observation: attentive    Pt was educated on addictive disease. Concepts included tolerance, withdrawal, cellular adaptation, preoccupation, cravings, withdrawal, loss of control, denial, rationalization, shame, fear and powerlessness. Pt was informed of addiction as a brain disease. Pt was informed of recovery strategies including medication, counseling groups and sober support. Pt was validated for the courage to face major lifestyle changes required to enter recovery.       Yessenia Maillet B. Frida Wahlstrom, LPC, MAC, NCC, CSAC

## 2016-12-29 NOTE — Progress Notes (Signed)
Nursing Note: Patient arrived at 0802 and was admitted to [facility] per MD order for alcohol use disorder. She was discharged from [facility] in patient on 12/26/16.  VSS, her BAL was negative and her UDS was negative. She denied any relapse since being discharged from in patient. She reports she slept five hours last night.    Her CIWA was a 7 at 0859 and a 4 at 1435.  She denied having any SI or HI.    Pleasant and cooperative, she attended all available groups, and indicated will return to [facility] tomorrow.

## 2016-12-29 NOTE — Progress Notes (Signed)
[facility] Day [facility] Orientation Note:     Pt and therapist reviewed orientation documents to include day treatment expectancies form, confidentiality of patient information statement, guidelines for group participation, practice guidelines, patient rights, release of information for mother, treatment plan intake form, welcome letter, mission statement, epicare information, guidelines on OTC medications and prescriptions, weather closure line, concerned others group and day treatment group schedule. Pt was given a tour of the facility and welcomed warmly by staff.     All forms were completed appropriately, initialed, dated, signed and placed in ghost chart at nursing office.      PACS Checklist was completed as reflected below (Pt's have 6 options to answer only their answer is noted below).     1. During the past 24 hours how often have you thought about how good using drugs or alcohol would make you feel? 2  2. At it's most severe point, how severe was your craving during the past 24 hours? 2  3. During the past 24 hours how much time have you spent thinking about using drugs or alcohol or about how good using drugs or alcohol would make you feel? 1  4. During the past 24 hours how difficult would it have been to resist using drugs or alcohol if you had known some were in the house? 2  5. Keeping in mind your response to the previous questions, please rate your overall average drugs or alcohol craving in the past 24 hours. 2    Gunnar Bulla, Social Work Intern
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
## 2016-12-29 NOTE — Progress Notes (Signed)
Topic: Cost Benefits Analysis   Goal:  Identification of cognitive distortions, Increased mindfulness, Positive behavioral changes  Intervention:  Cognitive/behavioral restructuring    Group Comments/Observations:     Pt attended: Yes  Pt participation: active: shared     Notes:     Behavioral Observation: respectful, quiet      Pt and peers participated in considering the advantages and disadvantages to using drugs/alcohol and advantages and disadvantages of ending their use of alcohol and other drugs.     Pt stated the advantages of use included feeling good, escape and numbing negative feelings and, the disadvantages as loss of trust, no money and the need to use. Pt reported the advantages of recovery as increased trust in family, saving money and self-respect. Pt noted disadvantages of recovery as change of lifestyle, no escape from reality and withdrawal.      Pt indicated that the costs were greater than the benefits in regard to their addiction.       Charlaine Dalton. Gurfateh Mcclain, LPC, MAC, NCC, CSAC

## 2016-12-29 NOTE — Progress Notes (Signed)
Topic:  Wellness Recovery Action Plan   Goal:  Identification of cognitive distortions, Increased mindfulness, Positive behavioral changes  Intervention:  Cognitive/behavioral restructuring    Patient Before Group Check-In:     Pt Attended: Yes  Pt Participated: Yes   (Reader is referred to scanned document in Chart with patient signature.)    Counseling Summary:       Pt and peers participated in sober planning group. Topics included abstinence plan, ways to exercise one's body and mind, something to do to invest in one's recovery, development of connection to others, seeking a greater understanding of self and or others, and something positive that was done for one's recovery.        Pt stated she would be away from alcohol for her abstinence plan tonight. Pt reported she would exercise her body and mind by going for a walk. Pt indicated that she would stay with her mother as something specific for her recovery tonight. Pt stated she would connect with others by dinner with mother. Pt reported she would seek a better understanding of self and others by reflecting.  Pt indicated that she felt good about 1st day today.        Pt denied SI/HI.     Charlaine Dalton. Shirelle Tootle LPC, MAC, NCC, CSAC

## 2016-12-29 NOTE — Progress Notes (Signed)
[facility] - HISTORY and PHYSICAL    Date/Time:  12/29/2016  10:25 AM  Patient Name: Erin Huber, Erin Huber  MRN:  [id number]  Age: 31 y.o.  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 She reports that "Alcohol became part of my life and I decided to stop drinking because if started to affect me physically and psychologically".     Current Health Problems:   Patient is a step down from [facility] inpatient unit  She was treated with ativan detox protocol   Mild craving for alcohol this morning  Denies relapse and remains sober until now   Sleeping and appetite have been improving  Mood is stable and denies depression and anxiety   This her first treatment at [facility], never been in any Detox center  As per patient she drinks beers and liquor   She has not taking adderall for more than 3 years  She had been diagnosed with ADHD since grade 3    Review of Systems:   A comprehensive review of systems was negative except for:  Constitutional: positive for fatigue  Eyes: Negative  Ears, nose, mouth, throat, and face: Negative  Respiratory: Negative  Cardiovascular: Negative  Gastrointestinal: Negative  Integument/breast: Negative  Hematologic/lymphatic: Negative  Musculoskeletal: Negative  Neurological: positive for Negative  Behavioral/Psych: positive for anxiety and sleep disturbance  Endocrine: Negative    History of Past Illnesses (Serious illnesses, injuries, surgeries, hospitalizations, including psychiatric admissions):     Past Medical History:   Diagnosis Date   . Anxiety    . Depression        Family Medical History:     Family History   Problem Relation Age of Onset   . Cancer Maternal Grandmother    . Drug  abuse Sister        Social History:     History   Alcohol Use   . Yes     Comment: Drinks 8-10 shots of liquor, 1 bottle of wine and 3-6 hard llemonades daily     History   Drug Use No     History   Smoking Status   . Current Every Day Smoker   . Packs/day: 1.00   . Types: Cigarettes   Smokeless Tobacco   . Never Used     Physical Exam:     General appearance - oriented to person, place, and time   Mental status -  Anxious mood, guarded  Eyes - pupils equal and reactive, extraocular eye movements intact  Ears - bilateral TM's and external ear canals normal  Nose - no septal perforation  Mouth - mucous membranes moist, pharynx normal without lesions  Neck - supple, no significant adenopathy  Chest - clear to auscultation, no wheezes, rales or rhonchi, symmetric air entry  Heart - normal rate, regular rhythm, normal S1, S2, no murmurs, rubs, clicks or gallops  Abdomen - soft, nontender, nondistended, no masses or organomegaly  Pelvic - deferred to PCP  LMP - No LMP recorded (lmp unknown). Patient is not currently  having periods (Reason: Irregular menses).  Is patient pregnant: No  Rectal - deferred to PCP.  Neurological - alert, oriented, normal speech, no focal findings or movement disorder noted, screening mental status exam normal, neck supple without rigidity, cranial nerves II through XII intact, motor and sensory grossly normal bilaterally, normal muscle tone, no tremors, strength 5/5  Musculoskeletal - no joint tenderness, deformity or swelling  Extremities - peripheral pulses normal, no pedal edema, no clubbing or cyanosis  Skin - normal     Mental Status Exam:   Appearance: No apparent distress  Behavior/relationship to examiner/demeanor:  Guarded  Motor activity/EPS:  Normal  Gait:   Normal  Mood (subjective report):   anxious  Affect (objective appearance):  Appropriate/mood-congruent  Fund of Knowledge/Intelligence:   Average  Abstraction:   Normal  Insight:   Fair  Judgment:   Fair      Clinical  Diagnosis:    Alcohol abuse and dependence   Substance induced mood disorder   Substance induced insomnia     This patient has been informed of their individual treatment plan.  This includes an explanation of the action of all prescribed psychoactive medications.    List of Prescribed Psychoactive Medications (Antidepressants, Mood Stabilizers, Antipsychotics, Antianxiety, Stimulants):   Trazodone   Vistaril     The benefits, potential side effects, risks, and possible drug interactions were explained to this patient who indicated that she understood and agrees to the plan of care.   All patient questions were answered.    Plan:  Admit to [facility]   Continue Trazodone 75 mg at bedtime if insomnia and Vistaril 25 to 50 mg BID as PRN if anxiety  Daily urine toxicology and breathalyzer   Substance Abuse Counseling  IOP Triage  Discuss with treatment team  Review labs with patient  COUNSEL ON SMOKING CESSATION  OFFER Rx TO STOP SMOKING    I certify that [facility] PHP services are medically necessary for this patient due to Alcohol abuse and Withdrawal and the resultant risk to self.Marland Kitchen Please see H&P and MD progress notes for additional information about the patient's course of treatment. The patient continues to need active treatment furnished by and requiring the supervision of inpatient facility personnel. I certify that [facility] PHP services are medically necessary for treatment which could reasonably be expected to improve the patient's condition. Please see H&P and MD progress notes for additional information about the patient's course of treatment. For Medicare patients, services to be provided in accordance with 412.3 and expected LOS to be greater than 2 midnights for Medicare patients.         Signed by: Micah Flesher, MD   12/29/2016 10:25 AM

## 2016-12-29 NOTE — Progress Notes (Signed)
Topic:  Art as Therapy     Goal:  Develop self-soothing and grounding skills; distress tolerance; increase affective expression and self-efficacy.     Intervention:  Art therapy directive- open studio approach     Group Comments/Observations:       Pt attended: Yes  Pt participation: Active     Notes:       Behavioral Observation:  Engaged and Cooperative         This Clinical research associate introduced herself to new group members and briefly described how art therapy stimulates different areas of the brain and can help patients to develop self-soothing skills and self-awareness.   This Writer applied an open studio approach, in which patients chose which art materials they wanted to use and what they wanted to create.  Patients appeared self-directed and focused, as they explored the different media (color pencils, color markers, Model Magic clay, construction paper, and found objects).  The group listened to music during art making.  Afterwards, patients talked about their respective experiences with the art making process.  This Writer acknowledged the group's accomplishment in creating artwork and strengthening rapport among patients.     Pt initially expressed hesitancy about making art.  She demonstrated a willingness to try something new and reportedly enjoyed drawing flowers on a sheet of paper.  Pt described drawing these particular images as "relaxing."  Pt received validating and constructive feedback from peers and counselor.     Larene Beach, LCSW

## 2016-12-30 ENCOUNTER — Ambulatory Visit (HOSPITAL_BASED_OUTPATIENT_CLINIC_OR_DEPARTMENT_OTHER): Payer: Commercial Managed Care - PPO

## 2016-12-30 DIAGNOSIS — F1994 Other psychoactive substance use, unspecified with psychoactive substance-induced mood disorder: Secondary | ICD-10-CM

## 2016-12-30 DIAGNOSIS — F10282 Alcohol dependence with alcohol-induced sleep disorder: Secondary | ICD-10-CM

## 2016-12-30 DIAGNOSIS — R6889 Other general symptoms and signs: Secondary | ICD-10-CM

## 2016-12-30 NOTE — Progress Notes (Signed)
Topic:Determining Individual Value System  Goal:  improves patient's self awarness, hope,and kindness towards themselves and others.     Intervention:  Art Therpy Intervention  Therapist introduces the art idea of drawing a scene that represent pt's sobriety. Pt will be encouraged to share about their drawing, the shape/picture that their painting represent. Pt will be encourage to identify the experienced feelings and the craving level while drawing the picture.       Group Comments/Observations:     Attendance: yes  Participation: Active Patient was Cooperative and Engaged;     Notes:     Content of Session: Pt reported to be feeling: "good". Patient's participation in group was appropriate. opportunity to express the personal values that will keep her sobriety. Pt stated that sobriety looks like a rainbow where she can experience happiness and excitement.       Erin Dandy, MA Resident in Counseling, [facility]

## 2016-12-30 NOTE — [facility] Treatment History (Signed)
CHEMICAL DEPENDENCE AND MENTAL HEALTH TREATMENT HISTORY  INCLUDE BOTH INPATIENT AND OUTPATIENT TREATMENT ATTEMPTS            Facility Name Dates Presenting Problem Outcome/Years Sober   [facility] Age 31  DUI    [facility] IP 12/21/16-  12/26/13 alcohol    [facility] PHP 12/29/16 alcohol    PCP, Dr. Edmon Crape   Last saw a couple of weeks ago   Psychiatrist, therapist,none

## 2016-12-30 NOTE — Progress Notes (Signed)
Nursing Note: Patient arrived at 72.  VSS, her BAL was negative and her UDS was negative.  She denied any relapse  the previous night. She reports she slept six hours last night.    Her CIWA was a 4 at 0930 and a 3 at 1444.  She denied having any SI or HI.      Pleasant and cooperative, she attended all available groups, and indicated will return to [facility] tomorrow.

## 2016-12-30 NOTE — Progress Notes (Signed)
Topic: Touch-Base/Round Robin        Goal:  Build Rapport/establish relationship between therapist and patients      Intervention:  person centered      Group Comments/Observations:     Pt attended: Yes  Pt participation: active     Notes:     Suicide Assessment Tool (SAT)    Thoughts (Suicidal Ideation): verbalizes no current ideation  Plans (Suicidal Ideation): none to occasional thoughts of suicide with no plan  Method (Suicidal Ideation): none  Impulse Control (Behavior Cues): adequate impulse control  Behavioral Activity (Behavior Cues): consistent in behavior or patterns  Preparation for Death (Behavior Cues): none  Predominant Mood or Affect: signs of mild depression or indicators of hopefulness  Mood Stability (Mood/Affect): mood stable  Tolerance of Feelings (Mood/Affect): feelings tolerable  Problem Solving (Cognition/Perception): problem solving intact  Perception (Cognition/Perception): accurate perception of reality  Monitoring/Suicide Alert Level: Routine monitoring    Behavioral Observation: cheerful, anxious       Therapist casually inquired about patient's present condition. General inquires included how pt was doing in their recovery journey, what their previous evening entailed, how they were progressing with their family and other significant relationships and other concerns that were impacting their sobriety. PACS Checklist was completed as reflected below (Pt's have 6 options to answer only their answer is noted below).     1. During the past 24 hours how often have you thought about how good using drugs or alcohol would make you feel? 2  2. At it's most severe point, how severe was your craving during the past 24 hours? 2  3. During the past 24 hours how much time have you spent thinking about using drugs or alcohol or about how good using drugs or alcohol would make you feel? 1  4. During the past 24 hours how difficult would it have been to resist using drugs or alcohol if you had known some  were in the house? 2  5. Keeping in mind your response to the previous questions, please rate your overall average drugs or alcohol craving in the past 24 hours. 2      Patient responded to questions and engaged in collaborative conversation. Pt shared that she felt nervous and excited. Pt denied relapse. Pt reported her goal was to stay positive. Pt reported she was grateful for her family and to be here.   Pt presented in the contemplative stage of change as evidenced by her initial efforts at treatment and early phase of her recovery      Casimiro Needle B. Janavia Rottman LPC, MAC, NCC, CSAC

## 2016-12-30 NOTE — Progress Notes (Signed)
Topic:  Wellness Recovery Action Plan   Goal:  Identification of cognitive distortions, Increased mindfulness, Positive behavioral changes  Intervention:  Cognitive/behavioral restructuring    Patient Before Group Check-In:     Pt Attended: yes  Pt Participated: yes   (Reader is referred to scanned document in Chart with patient signature.)    Counseling Summary:       Pt and peers participated in sober planning group. Topics included abstinence plan, ways to exercise one's body and mind, something to do to invest in one's recovery, development of connection to others, seeking a greater understanding of self and or others, and something positive that was done for one's recovery.        Pt stated she would stay in for her abstinence plan tonight. Pt reported she would exercise her body and mind by going for a walk. Pt indicated that she would stay sober as something specific for her recovery tonight. Pt stated she would connect with others by being with mom. Pt reported she would seek a better understanding of self and others by focusing on today.  Pt indicated that she felt good about being here today.        Pt denied SI/HI.     Charlaine Dalton. Millianna Szymborski LPC, MAC, NCC, CSAC

## 2016-12-30 NOTE — Progress Notes (Signed)
Topic:copin skills   Goal:  Increased mindfulness, Positive behavioral changes, To increase understanding of Interpersonal Dynamics; Gain Self Awareness     Intervention: Patient was provided with psycho education on safe coping skills. Patient was also introduced to concepts of maladaptive coping, secondary gains of maladaptive coping, resistance to engaging in safe coping, and behavioral activation as a tool for overcoming low motivation. Patient had an opportunity to engage in exploration of each concept through group brainstorming. Patient was also provided handouts that included examples and suggestions of safe coping skills.    Group Comments/Observations:     Attendance: 50 minutes  Participation: Active Patient was Cooperative and Engaged;       Notes:     Behavioral Observations: Patient was alert; she presented with normal mood and normal affect. Pt stated that taking care of herself is the most area needs improvement.           Johnna Acosta, MA, NCC, Resident in Counseling

## 2016-12-30 NOTE — Progress Notes (Signed)
[place] [facility] - PHP MD  PROGRESS NOTE    Date/Time:  12/30/2016  1:58 PM  Patient Name: Erin Huber, Erin Huber  MRN:  [id number]  Age: 31 y.o.  DOB: 01-11-1986    Vital Signs:     Vitals:    12/30/16 0930   BP: 122/79   Pulse: 90   Resp: 18   Temp: (!) 96.2 F (35.7 C)     Allergies:   No Known Allergies    Chief Complaints:   Patient seen today, chart reviewed and discussed with staff, reports feeling better and except for the fatigue, she worried about her anemia and low platelet. Denies relapse and remains sober. Her craving 3/10   Mood is stable and denies depression and anxiety.    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 She reports that "Alcohol became part of my life and I decided to stop drinking because if started to affect me physically and psychologically".     Current Health Problems:   Patient is a step down from [facility] inpatient unit  She was treated with ativan detox protocol   Mild craving for alcohol this morning  Denies relapse and remains sober until now   Sleeping and appetite have been improving  Mood is stable and denies depression and anxiety   This her first treatment at [facility], never been in any Detox center  As per patient she drinks beers and liquor   She has not taking adderall for more than 3 years  She had been diagnosed with ADHD since grade 3    Review of Systems:   A comprehensive review of systems was negative except for:  Constitutional: positive for fatigue  Eyes: Negative  Ears, nose, mouth, throat, and face: Negative  Respiratory: Negative  Cardiovascular: Negative  Gastrointestinal: Negative  Integument/breast: Negative  Hematologic/lymphatic: Negative  Musculoskeletal: Negative  Neurological: positive for Negative  Behavioral/Psych: positive for anxiety and sleep disturbance  Endocrine: Negative    History of Past Illnesses  (Serious illnesses, injuries, surgeries, hospitalizations, including psychiatric admissions):     Past Medical History:   Diagnosis Date   . Anxiety    . Depression        Family Medical History:     Family History   Problem Relation Age of Onset   . Cancer Maternal Grandmother    . Drug abuse Sister        Social History:     History   Alcohol Use   . Yes     Comment: Drinks 8-10 shots of liquor, 1 bottle of wine and 3-6 hard llemonades daily     History   Drug Use No     History   Smoking Status   . Current Every Day Smoker   . Packs/day: 1.00   . Types: Cigarettes   Smokeless Tobacco   . Never Used     Physical Exam:     General appearance - oriented to person, place, and time   Mental status -  Anxious mood, guarded  Eyes - pupils equal and reactive, extraocular eye movements intact  Ears - bilateral TM's and external ear canals normal  Nose - no septal perforation  Mouth - mucous membranes moist, pharynx normal without lesions  Neck - supple, no significant adenopathy  Chest - clear to auscultation,  no wheezes, rales or rhonchi, symmetric air entry  Heart - normal rate, regular rhythm, normal S1, S2, no murmurs, rubs, clicks or gallops  Abdomen - soft, nontender, nondistended, no masses or organomegaly  Pelvic - deferred to PCP  LMP - No LMP recorded (lmp unknown). Patient is not currently having periods (Reason: Irregular menses).  Is patient pregnant: No  Rectal - deferred to PCP.  Neurological - alert, oriented, normal speech, no focal findings or movement disorder noted, screening mental status exam normal, neck supple without rigidity, cranial nerves II through XII intact, motor and sensory grossly normal bilaterally, normal muscle tone, no tremors, strength 5/5  Musculoskeletal - no joint tenderness, deformity or swelling  Extremities - peripheral pulses normal, no pedal edema, no clubbing or cyanosis  Skin - normal     Mental Status Exam:   Appearance: No apparent distress  Behavior/relationship to  examiner/demeanor:  Guarded  Motor activity/EPS:  Normal  Gait:   Normal  Mood (subjective report):   anxious  Affect (objective appearance):  Appropriate/mood-congruent  Fund of Knowledge/Intelligence:   Average  Abstraction:   Normal  Insight:   Fair  Judgment:   Fair      Clinical Diagnosis:    Alcohol abuse and dependence   Substance induced mood disorder   Substance induced insomnia     This patient has been informed of their individual treatment plan.  This includes an explanation of the action of all prescribed psychoactive medications.    List of Prescribed Psychoactive Medications (Antidepressants, Mood Stabilizers, Antipsychotics, Antianxiety, Stimulants):   Trazodone   Vistaril     The benefits, potential side effects, risks, and possible drug interactions were explained to this patient who indicated that she understood and agrees to the plan of care.   All patient questions were answered.    Plan:  Continue treatment at [facility]   Continue Trazodone 75 mg at bedtime if insomnia and Vistaril 25 to 50 mg BID as PRN if anxiety  Daily urine toxicology and breathalyzer   Substance Abuse Counseling  IOP Triage  Discuss with treatment team  Review labs with patient  COUNSEL ON SMOKING CESSATION  OFFER Rx TO STOP SMOKING    I certify that [facility] PHP services are medically necessary for this patient due to Alcohol abuse and Withdrawal and the resultant risk to self.Marland Kitchen Please see H&P and MD progress notes for additional information about the patient's course of treatment. The patient continues to need active treatment furnished by and requiring the supervision of inpatient facility personnel. I certify that [facility] PHP services are medically necessary for treatment which could reasonably be expected to improve the patient's condition. Please see H&P and MD progress notes for additional information about the patient's course of treatment. For Medicare patients, services to be provided in accordance with 412.3 and  expected LOS to be greater than 2 midnights for Medicare patients.         Signed by: Micah Flesher, MD   12/30/2016 1:58 PM

## 2016-12-30 NOTE — Progress Notes (Signed)
Topic:Defense Mechanisms   Goal:  Increased Positive behavioral changes, To increase understanding of Interpersonal Dynamics; Gain Self Awareness     Intervention: Pt was presented with information regarding defense mechanisms and the opportunity to process examples of defense mechanisms with the group.     Defense Mechanisms - In this group, patients received teaching and handouts regarding the definition and purpose of defense mechanisms and ways to address them. Patients participated in group discussion related to common defense mechanisms, such as acting out, denial, displacement, enabling, isolation, lying, minimizing, repression, and suppression. Patients identified which defense mechanisms they tend to engage most often and ways to develop healthier self-care and coping strategies.     Group Comments/Observations:     Attendance: 50 minutes  Participation: Active Patient was Cooperative and Engaged;       Notes:     Behavioral Observations: Patient was alert; she presented with normal mood and mood-congruent affect. Pt stated that sublimation is her defense mechanism that she used to use to protect herself. Pt stated that sharing with others and stay with a healthy task will be helpful in her sobriety.    Johnna Acosta, MA, NCC, Resident in Counseling

## 2016-12-31 ENCOUNTER — Ambulatory Visit (HOSPITAL_BASED_OUTPATIENT_CLINIC_OR_DEPARTMENT_OTHER): Payer: Commercial Managed Care - PPO

## 2016-12-31 ENCOUNTER — Telehealth (HOSPITAL_BASED_OUTPATIENT_CLINIC_OR_DEPARTMENT_OTHER): Payer: Self-pay

## 2016-12-31 ENCOUNTER — Other Ambulatory Visit (HOSPITAL_BASED_OUTPATIENT_CLINIC_OR_DEPARTMENT_OTHER): Payer: Self-pay

## 2016-12-31 DIAGNOSIS — G4709 Other insomnia: Secondary | ICD-10-CM

## 2016-12-31 DIAGNOSIS — F1994 Other psychoactive substance use, unspecified with psychoactive substance-induced mood disorder: Secondary | ICD-10-CM

## 2016-12-31 DIAGNOSIS — R6889 Other general symptoms and signs: Secondary | ICD-10-CM

## 2016-12-31 DIAGNOSIS — F10239 Alcohol dependence with withdrawal, unspecified: Secondary | ICD-10-CM

## 2016-12-31 NOTE — Progress Notes (Signed)
Topic: Touch-Base/Round Robin        Goal:  Build Rapport/establish relationship between therapist and patients      Intervention:  person centered      Group Comments/Observations:     Pt attended: Yes  Pt participation: active     Notes:     Suicide Assessment Tool (SAT)    Thoughts (Suicidal Ideation): verbalizes no current ideation  Plans (Suicidal Ideation): none to occasional thoughts of suicide with no plan  Method (Suicidal Ideation): none  Impulse Control (Behavior Cues): adequate impulse control  Behavioral Activity (Behavior Cues): consistent in behavior or patterns  Preparation for Death (Behavior Cues): none  Predominant Mood or Affect: signs of mild depression or indicators of hopefulness  Mood Stability (Mood/Affect): mood stable  Tolerance of Feelings (Mood/Affect): feelings tolerable  Problem Solving (Cognition/Perception): problem solving intact  Perception (Cognition/Perception): accurate perception of reality  Monitoring/Suicide Alert Level: Routine monitoring    Behavioral Observation: Active and engaged       Therapist casually inquired about patient's present condition. General inquires included how pt was doing in their recovery journey, what their previous evening entailed, how they were progressing with their family and other significant relationships and other concerns that were impacting their sobriety. PACS Checklist was completed as reflected below (Pt's have 6 options to answer only their answer is noted below).     1. During the past 24 hours how often have you thought about how good using drugs or alcohol would make you feel? 2  2. At it's most severe point, how severe was your craving during the past 24 hours? 3  3. During the past 24 hours how much time have you spent thinking about using drugs or alcohol or about how good using drugs or alcohol would make you feel? 2  4. During the past 24 hours how difficult would it have been to resist using drugs or alcohol if you had known  some were in the house? 3  5. Keeping in mind your response to the previous questions, please rate your overall average drugs or alcohol craving in the past 24 hours. 3      Patient responded to questions and engaged in collaborative conversation. Pt shared that she had a good night and her boyfriend came over at her parent's house where they had dinner together and watch a movie. Pt stated that her goal today is to stay sober and to learn something new.    Pt presented in the contomplative stage of change as evidenced by pt's desire to stay sober and to seek treatment for her aftercare.      Johnna Acosta, [place], [facility]

## 2016-12-31 NOTE — Progress Notes (Signed)
Topic: Recovery/Abstinence/Relapse/Harm Reduction   Goal:  Identify cognitive distortions, Increase mindfulness, Positive behavioral changes  Intervention:  Interactive didactic instruction & Discussion    Group Comments/Observations:     Pt attended: Yes  Pt participation: active: seemed to listen, shared     Notes:     Behavioral Observation: attentive    Pt was educated on definitions of Recovery, Abstinence, Relapse, and Harm Reduction. Pts were informed that recovery involved a restoration of body mind and spirit along with increases in occupational, family and social functioning. Abstinence was described as not using drugs, but not making other improvements. Relapse was defined as a return to behaviors of addiction. Harm reduction was described as attempts to circumvent consequences by switching drug use to theoretically lesser harmful drugs.  Pt's were informed that [facility] was encouraging recovery and discouraged attempts at harm reduction as the success rates were markedly lower for this approach as opposed to recovery.       Reginold Beale B. Deral Schellenberg, LPC, MAC, NCC, CSAC

## 2016-12-31 NOTE — Telephone Encounter (Signed)
Writer called pt's specialty pharmacy, [facility], to schedule delivery of Vivitrol to clinic. Scheduled delivery date 01/06/17.

## 2016-12-31 NOTE — Progress Notes (Signed)
Topic: Sobriety     Goal: Increased mindfulness, Positive behavioral changes  Intervention: Art and collage group work    Group Comments/Observations:     Pt attended: Yes  Pt participation:  Active and engaged     Notes:     Behavioral Observation:       Pt and peers were invited to create a collage, draw or use other sources of self expression to represent their aspirations for sobriety. Pt's were educated on terminology of relapse, recovery, abstinence, sobriety. Pt identified ideas about their sobriety and how they can maintain it. Pt participated in exercise of expressing their best hopes for sobriety and they shared their ideas with their peers and were able to hear positive feedbacks from each others.     Pt reported that her sobriety is connecting with her self care and her nutrition. Pt also picked some phots that has yoga and healthy meals.     Pt received positive feedback and support from peers and staff.    Johnna Acosta MA, NCC, Resident in Counseling

## 2016-12-31 NOTE — Progress Notes (Signed)
Topic: Relapse Triggers and Cues   Goal:  Identification of cognitive distortions, increased mindfulness, Positive behavioral changes  Intervention:  Cognitive/behavioral restructuring    Group Comments/Observations:     Pt attended: Yes  Pt participation:  Active    Notes:     Behavioral Observation:  anxious    Pt and peers were provided with a short educational talk about relapse triggers in that triggers were described as a person, place, thing situation or event that increases the potential for a person to pick up a drink or a drug. Pt's sorted cards with different triggers and were invited to share about which triggers impacted them most. Additionally they processed with group how to anticipate, plan and act when these triggers took place.      Pt reported returning home to a boyfriend that drank would be tough. Pt explored extending her stay with her mother and or talking to boyfriend about his willingness or capability to stop drinking. Pt noted she would take time to investigate possibilities as suggested.      Pt received positive feedback and support from peers and staff.    Charlaine Dalton. Orrin Yurkovich, LPC, MAC, NCC, CSAC

## 2016-12-31 NOTE — Telephone Encounter (Signed)
Writer received call from [employer] stating that pt had been assigned to [facility] for Vivitrol. Pt previously enrolled to program while inpatient at [facility] and received Vivitrol while inpatient on 12/26/16. Pt is currently attending the [facility] daytreatment program at [facility]. Daytreatment RN verified with pt that she wishes to continue to receive the Vivitrol outpatient. Dr Suanne Marker was consulted by this Clinical research associate. He has already done an H&P for pt in the daytreatment program on 12/29/16. Hepatic function panel results reviewed with Dr Suanne Marker from 12/22/16 and 12/24/16. Dr Suanne Marker gave order for pt to have another Hepatic function panel completed prior to next injection. Order entered and lab requisition order printed for pt. Writer contacted pt to ask her to pick up the form when she comes for daytreatment tomorrow. There was no answer. Voice msg left for pt to call clinic. Nurse would ask pt to complete labs and would notify her when medication is delivered to clinic so that she can make next appt for administration once labs completed ( due beginning of November).

## 2016-12-31 NOTE — Progress Notes (Signed)
Nursing Note: Patient arrived at 45.  VSS, her BAL was negative and her UDS was negative.  She denied any relapse the previous night. She reports she slept six hours last night. She stated she has cravings for alcohol and said they are a 3 on a scale of 0 to five.    Her CIWA was a 2 at 0905 and a 2 at 62.  She denied having any SI or HI.    She stated her plan is to attend [facility] IOP.    Pleasant and cooperative, she attended all available groups, and indicated will return to [facility] tomorrow.

## 2016-12-31 NOTE — Progress Notes (Signed)
Topic:  Wellness Recovery Action Plan   Goal:  Identification of cognitive distortions, Increased mindfulness, Positive behavioral changes  Intervention:  Cognitive/behavioral restructuring    Patient Before Group Check-In:     Pt Attended: yes  Pt Participated: yes   (Reader is referred to scanned document in Chart with patient signature.)    Counseling Summary:       Pt and peers participated in sober planning group. Topics included abstinence plan, ways to exercise one's body and mind, something to do to invest in one's recovery, development of connection to others, seeking a greater understanding of self and or others, and something positive that was done for one's recovery.        Pt stated she would stay at home for her abstinence plan tonight. Pt reported she would exercise her body and mind by going for a walk and stretching. Pt indicated that she would stay sober as something specific for her recovery tonight. Pt stated she would connect with others by talking with mother. Pt reported she would seek a better understanding of self and others by focus on today.  Pt indicated that she felt good about sharing today.        Pt denied SI/HI.     Charlaine Dalton. Clarabelle Oscarson LPC, MAC, NCC, CSAC

## 2017-01-01 ENCOUNTER — Ambulatory Visit (HOSPITAL_BASED_OUTPATIENT_CLINIC_OR_DEPARTMENT_OTHER): Payer: Commercial Managed Care - PPO

## 2017-01-01 VITALS — BP 131/75 | HR 89 | Temp 96.3°F | Resp 18

## 2017-01-01 DIAGNOSIS — R6889 Other general symptoms and signs: Secondary | ICD-10-CM

## 2017-01-01 DIAGNOSIS — F10282 Alcohol dependence with alcohol-induced sleep disorder: Secondary | ICD-10-CM

## 2017-01-01 DIAGNOSIS — F1994 Other psychoactive substance use, unspecified with psychoactive substance-induced mood disorder: Secondary | ICD-10-CM

## 2017-01-01 NOTE — Progress Notes (Signed)
[facility] Day Treatment   Sober Planning Group Counseling Note    Patient Name:  Erin Huber [[id number]] DOB: 1985-07-28  Date: 01/01/2017    Topic:  Wellness Recovery Action Plan    Goal:  Identification of cognitive distortions, Increased mindfulness, Positive behavioral changes  Intervention:  Cognitive/behavioral restructuring    Patient Before Group Check-In:   (Reader is referred to scanned document in Chart with patient signature.)    Counseling Summary:   Pt and peers participated in sober planning group. Topics included abstinence plan, ways to exercise one's body and mind, something to do to invest in one's recovery, development of connection to others, seeking a greater understanding of self and or others, and something positive that was done for one's recovery.        Pt stated she would "stay at my mom's and stay in" for her abstinence plan tonight. Pt reported she would exercise her body and mind by "going for a walk and stretching". Pt indicated that she would "stay sober" as something specific for her recovery tonight. Pt stated she would connect with others by "hang out with my mom and call the boyfriend". Pt reported she would seek a better understanding of self and others by "sit back and reflect on the day, one day at a time".  Pt indicated that she felt good about "4th day of outpatient" today.     Pt denied SI/HI.     Retia Passe, M.Ed., NCC, Resident in Counseling

## 2017-01-01 NOTE — Progress Notes (Signed)
[facility] - PHP MD  PROGRESS NOTE    Date/Time:  01/01/2017  12:09 AM  Patient Name: Erin Huber, Erin Huber  MRN:  [id number]  Age: 31 y.o.  DOB: 11/03/1985    Vital Signs:     Vitals:    12/31/16 1424   BP: 112/72   Pulse: 84   Resp: 18   Temp:      Allergies:   No Known Allergies    Chief Complaints:   Patient seen today, chart reviewed and discussed with staff, reports feeling better and except for the fatigue, she worried about her anemia and low platelet. Denies relapse and remains sober. Her craving is very low. Mood is stable and denies depression and anxiety.    Reason of admission to [facility]  This is a 61 year old white single, employed as "waitress" presents to this clinic as "step down" from inpatient unit after being treated from September 30 to October 05 /2018. She reports that "Alcohol became part of my life and I decided to stop drinking because if started to affect me physically and psychologically".     Current Health Problems:   Patient is a step down from [facility] inpatient unit  She was treated with ativan detox protocol   Mild craving for alcohol this morning  Denies relapse and remains sober until now   Sleeping and appetite have been improving  Mood is stable and denies depression and anxiety   This her first treatment at [facility], never been in any Detox center  As per patient she drinks beers and liquor   She has not taking adderall for more than 3 years  She had been diagnosed with ADHD since grade 3    Review of Systems:   A comprehensive review of systems was negative except for:  Constitutional: positive for fatigue  Eyes: Negative  Ears, nose, mouth, throat, and face: Negative  Respiratory: Negative  Cardiovascular: Negative  Gastrointestinal: Negative  Integument/breast: Negative  Hematologic/lymphatic: Negative  Musculoskeletal: Negative  Neurological: positive for Negative  Behavioral/Psych: positive for anxiety and sleep disturbance  Endocrine: Negative    History of Past Illnesses (Serious  illnesses, injuries, surgeries, hospitalizations, including psychiatric admissions):     Past Medical History:   Diagnosis Date   . Anxiety    . Depression        Family Medical History:     Family History   Problem Relation Age of Onset   . Cancer Maternal Grandmother    . Drug abuse Sister        Social History:     History   Alcohol Use   . Yes     Comment: Drinks 8-10 shots of liquor, 1 bottle of wine and 3-6 hard llemonades daily     History   Drug Use No     History   Smoking Status   . Current Every Day Smoker   . Packs/day: 1.00   . Types: Cigarettes   Smokeless Tobacco   . Never Used     Physical Exam:     General appearance - oriented to person, place, and time   Mental status -  Anxious mood, guarded  Eyes - pupils equal and reactive, extraocular eye movements intact  Ears - bilateral TM's and external ear canals normal  Nose - no septal perforation  Mouth - mucous membranes moist, pharynx normal without lesions  Neck - supple, no significant adenopathy  Chest - clear to auscultation, no wheezes, rales or  rhonchi, symmetric air entry  Heart - normal rate, regular rhythm, normal S1, S2, no murmurs, rubs, clicks or gallops  Abdomen - soft, nontender, nondistended, no masses or organomegaly  Pelvic - deferred to PCP  LMP - No LMP recorded (lmp unknown). Patient is not currently having periods (Reason: Irregular menses).  Is patient pregnant: No  Rectal - deferred to PCP.  Neurological - alert, oriented, normal speech, no focal findings or movement disorder noted, screening mental status exam normal, neck supple without rigidity, cranial nerves II through XII intact, motor and sensory grossly normal bilaterally, normal muscle tone, no tremors, strength 5/5  Musculoskeletal - no joint tenderness, deformity or swelling  Extremities - peripheral pulses normal, no pedal edema, no clubbing or cyanosis  Skin - normal     Mental Status Exam:   Appearance: No apparent distress  Behavior/relationship to  examiner/demeanor:  Guarded  Motor activity/EPS:  Normal  Gait:   Normal  Mood (subjective report):   anxious  Affect (objective appearance):  Appropriate/mood-congruent  Fund of Knowledge/Intelligence:   Average  Abstraction:   Normal  Insight:   Fair  Judgment:   Fair      Clinical Diagnosis:    Alcohol abuse and dependence   Substance induced mood disorder   Substance induced insomnia     This patient has been informed of their individual treatment plan.  This includes an explanation of the action of all prescribed psychoactive medications.    List of Prescribed Psychoactive Medications (Antidepressants, Mood Stabilizers, Antipsychotics, Antianxiety, Stimulants):   Trazodone   Vistaril     The benefits, potential side effects, risks, and possible drug interactions were explained to this patient who indicated that she understood and agrees to the plan of care.   All patient questions were answered.    Plan:  Continue treatment at [facility]   Continue Trazodone 75 mg at bedtime if insomnia and Vistaril 25 to 50 mg BID as PRN if anxiety  Daily urine toxicology and breathalyzer   Substance Abuse Counseling  IOP Triage  Discuss with treatment team  Review labs with patient  COUNSEL ON SMOKING CESSATION  OFFER Rx TO STOP SMOKING    I certify that [facility] PHP services are medically necessary for this patient due to Alcohol abuse and Withdrawal and the resultant risk to self.Marland Kitchen Please see H&P and MD progress notes for additional information about the patient's course of treatment. The patient continues to need active treatment furnished by and requiring the supervision of inpatient facility personnel. I certify that [facility] PHP services are medically necessary for treatment which could reasonably be expected to improve the patient's condition. Please see H&P and MD progress notes for additional information about the patient's course of treatment. For Medicare patients, services to be provided in accordance with 412.3 and  expected LOS to be greater than 2 midnights for Medicare patients.         Signed by: Micah Flesher, MD   01/01/2017 12:09 AM

## 2017-01-01 NOTE — Progress Notes (Signed)
Topic: Addictive Disease Process   Goal:  Identify cognitive distortions, Increase mindfulness, Positive behavioral changes  Intervention:  Interactive didactic instruction    Group Comments/Observations:     Pt attended: Yes  Pt participation: active: seemed to listen    Notes:     Behavioral Observation: attentive    Pt was educated on addictive disease. Concepts included tolerance, withdrawal, cellular adaptation, preoccupation, cravings, withdrawal, loss of control, denial, rationalization, shame, fear and powerlessness. Pt was informed of addiction as a brain disease. Pt was informed of recovery strategies including medication, counseling groups and sober support. Pt was validated for the courage to face major lifestyle changes required to enter recovery.       Neyra Pettie B. Latonda Larrivee, LPC, MAC, NCC, CSAC

## 2017-01-01 NOTE — Progress Notes (Signed)
Nursing Note: Patient arrived at 56.  VSS, her BAL was negative and her UDS was negative.  She denied any relapse  the previous night. She reports her cravings for alcohol are a 3 on a scale of 0 to 5. She reports she slept six hours last night.    Her CIWA was a 2 at 0927 and a 3 at 1430.  She denied having any SI or HI.    Patient received the vivitrol injection during in patient on 12/26/16. Patient was advised she is due to get her next vivitrol injection on 01/26/17. She was given a lab requisition to take to the lab to have a hepatic function panel drawn.     Pleasant and cooperative, she attended all available groups, and indicated will return to [facility] tomorrow.

## 2017-01-01 NOTE — Progress Notes (Signed)
Topic:Determining Individual coping skill   Goal:  improves patient's self awarness, relaxation, and healthy distraction   Intervention:  Art Therpy Intervention  Therapist introduces the art idea of drawing a scene that can help reduce their anxiety. Benefits of relaxation techniques were discussed, and relaxation skills were introduced and practiced, including deep breathing techniques, progressive muscle relaxation, and guided imagery. Group members had the opportunity share their scene with others and give positive feedback.       Group Comments/Observations:     Attendance: yes  Participation: Active Patient was Cooperative, Chief Operating Officer;     Notes:     Content of Session: Pt reported to be feeling: calm, friendly and comfortable. Patient's participation in group was appropriate. opportunity to express the personal relaxing ideas that will keep her sobriety.     Pt engaged in the activity and was able to share with the peers. Pt mentioned that nature helps a lot with his anxiety and helps her to calm down.     Corrie Dandy, MA Resident in Counseling, [facility]

## 2017-01-01 NOTE — Progress Notes (Signed)
Topic: Cognitive Therapy cycle of anxiety   Goal:  To increase understanding of the cycle of anxiety and safety behaviors; cognitive/behavioral restructuringIncreased awareness, Positive behavioral changes,     Intervention:  Interactive didactic instruction and Cognitive/behavioral restructuring; The cycle of anxiety and loss of confidence in coping with anxiety-provoking situations was introduced, and the negative reinforcement of safety behaviors (i.e., using avoidance, escape) was identified as the primary factor that maintains anxiety in the long term. Clients had opportunities to identify anticipatory thoughts, feelings, and behaviors, and alternative counter-statements to anxiety provoking self-talk and self-criticism.      Group Comments/Observations:     Attendance:Yes  Participation: Active Patient was Cooperative and Engaged;   Notes:     Response to intervention/skills and/or knowledge gained:  Patient's participation in group was appropriate. she gained insight into ways to identifying things that trigger her anxiety and healthy things that she can do to cope when she is anxious.     Behavioral Observations: Patient was Cooperative and Engaged; she presented with relaxed mood and normal affect.      Comments: pt stated that driving and unfamiliarity cause anxiety and sometimes she feels the anxiety symptoms without knowing the reason. Pt was able to identify some healthy ideas that help with anxiety symptoms. Pt mentioned that breathing and enjoy the natures are her ways to cope with anxiety symptoms.     Johnna Acosta, MA, NCC, Resident in Counseling

## 2017-01-01 NOTE — Progress Notes (Signed)
[facility] - PHP MD  PROGRESS NOTE    Date/Time:  01/01/2017  11:16 AM  Patient Name: Erin Huber, Erin Huber  MRN:  [id number]  Age: 31 y.o.  DOB: 1985-10-02    Vital Signs:     There were no vitals filed for this visit.  Allergies:   No Known Allergies    Chief Complaints:   Patient seen today, chart reviewed and discussed with staff, reports feeling better and except for the fatigue, she worried about her anemia and low platelet. Denies relapse and remains sober. Her craving 3/10   Mood is stable and denies depression and anxiety.    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 She reports that "Alcohol became part of my life and I decided to stop drinking because if started to affect me physically and psychologically".     Current Health Problems:   Patient is a step down from [facility] inpatient unit  She was treated with ativan detox protocol   Mild craving for alcohol this morning  Denies relapse and remains sober until now   Sleeping and appetite have been improving  Mood is stable and denies depression and anxiety   This her first treatment at [facility], never been in any Detox center  As per patient she drinks beers and liquor   She has not taking adderall for more than 3 years  She had been diagnosed with ADHD since grade 3    Review of Systems:   A comprehensive review of systems was negative except for:  Constitutional: positive for fatigue  Eyes: Negative  Ears, nose, mouth, throat, and face: Negative  Respiratory: Negative  Cardiovascular: Negative  Gastrointestinal: Negative  Integument/breast: Negative  Hematologic/lymphatic: Negative  Musculoskeletal: Negative  Neurological: positive for Negative  Behavioral/Psych: positive for anxiety and sleep disturbance  Endocrine: Negative    History of Past Illnesses (Serious illnesses, injuries, surgeries,  hospitalizations, including psychiatric admissions):     Past Medical History:   Diagnosis Date   . Anxiety    . Depression        Family Medical History:     Family History   Problem Relation Age of Onset   . Cancer Maternal Grandmother    . Drug abuse Sister        Social History:     History   Alcohol Use   . Yes     Comment: Drinks 8-10 shots of liquor, 1 bottle of wine and 3-6 hard llemonades daily     History   Drug Use No     History   Smoking Status   . Current Every Day Smoker   . Packs/day: 1.00   . Types: Cigarettes   Smokeless Tobacco   . Never Used     Physical Exam:     General appearance - oriented to person, place, and time   Mental status -  Anxious mood, guarded  Eyes - pupils equal and reactive, extraocular eye movements intact  Ears - bilateral TM's and external ear canals normal  Nose - no septal perforation  Mouth - mucous membranes moist, pharynx normal without lesions  Neck - supple, no significant adenopathy  Chest - clear to auscultation, no wheezes, rales or rhonchi, symmetric air entry  Heart - normal rate, regular rhythm, normal S1, S2, no murmurs, rubs,  clicks or gallops  Abdomen - soft, nontender, nondistended, no masses or organomegaly  Pelvic - deferred to PCP  LMP - No LMP recorded (lmp unknown). Patient is not currently having periods (Reason: Irregular menses).  Is patient pregnant: No  Rectal - deferred to PCP.  Neurological - alert, oriented, normal speech, no focal findings or movement disorder noted, screening mental status exam normal, neck supple without rigidity, cranial nerves II through XII intact, motor and sensory grossly normal bilaterally, normal muscle tone, no tremors, strength 5/5  Musculoskeletal - no joint tenderness, deformity or swelling  Extremities - peripheral pulses normal, no pedal edema, no clubbing or cyanosis  Skin - normal     Mental Status Exam:   Appearance: No apparent distress  Behavior/relationship to examiner/demeanor:  Guarded  Motor  activity/EPS:  Normal  Gait:   Normal  Mood (subjective report):   anxious  Affect (objective appearance):  Appropriate/mood-congruent  Fund of Knowledge/Intelligence:   Average  Abstraction:   Normal  Insight:   Fair  Judgment:   Fair      Clinical Diagnosis:    Alcohol abuse and dependence   Substance induced mood disorder   Substance induced insomnia     This patient has been informed of their individual treatment plan.  This includes an explanation of the action of all prescribed psychoactive medications.    List of Prescribed Psychoactive Medications (Antidepressants, Mood Stabilizers, Antipsychotics, Antianxiety, Stimulants):   Trazodone   Vistaril     The benefits, potential side effects, risks, and possible drug interactions were explained to this patient who indicated that she understood and agrees to the plan of care.   All patient questions were answered.    Plan:  Continue treatment at [facility]   Continue Trazodone 75 mg at bedtime if insomnia and Vistaril 25 to 50 mg BID as PRN if anxiety  Daily urine toxicology and breathalyzer   Substance Abuse Counseling  IOP Triage  Discuss with treatment team  Review labs with patient  COUNSEL ON SMOKING CESSATION  OFFER Rx TO STOP SMOKING    I certify that [facility] services are medically necessary for this patient due to Alcohol abuse and Withdrawal and the resultant risk to self.Marland Kitchen Please see H&P and MD progress notes for additional information about the patient's course of treatment. The patient continues to need active treatment furnished by and requiring the supervision of inpatient facility personnel. I certify that [facility] services are medically necessary for treatment which could reasonably be expected to improve the patient's condition. Please see H&P and MD progress notes for additional information about the patient's course of treatment. For Medicare patients, services to be provided in accordance with 412.3 and expected LOS to be greater than 2  midnights for Medicare patients.         Signed by: Micah Flesher, MD   01/01/2017 11:16 AM

## 2017-01-01 NOTE — Progress Notes (Signed)
Topic: Touch-Base/Round Robin        Goal:  Build Rapport/establish relationship between therapist and patients      Intervention:  person centered      Group Comments/Observations:     Pt attended: Yes  Pt participation: active     Notes:     Suicide Assessment Tool (SAT)    Thoughts (Suicidal Ideation): verbalizes no current ideation  Plans (Suicidal Ideation): none to occasional thoughts of suicide with no plan  Method (Suicidal Ideation): none  Impulse Control (Behavior Cues): adequate impulse control  Behavioral Activity (Behavior Cues): consistent in behavior or patterns  Preparation for Death (Behavior Cues): none  Predominant Mood or Affect: signs of mild depression or indicators of hopefulness  Mood Stability (Mood/Affect): mood stable  Tolerance of Feelings (Mood/Affect): feelings tolerable  Problem Solving (Cognition/Perception): problem solving intact  Perception (Cognition/Perception): accurate perception of reality  Monitoring/Suicide Alert Level: Routine monitoring    Behavioral Observation: anxious       Therapist casually inquired about patient's present condition. General inquires included how pt was doing in their recovery journey, what their previous evening entailed, how they were progressing with their family and other significant relationships and other concerns that were impacting their sobriety. PACS Checklist was completed as reflected below (Pt's have 6 options to answer only their answer is noted below).     1. During the past 24 hours how often have you thought about how good using drugs or alcohol would make you feel? 3  2. At it's most severe point, how severe was your craving during the past 24 hours? 3  3. During the past 24 hours how much time have you spent thinking about using drugs or alcohol or about how good using drugs or alcohol would make you feel? 2  4. During the past 24 hours how difficult would it have been to resist using drugs or alcohol if you had known some were in  the house? 3  5. Keeping in mind your response to the previous questions, please rate your overall average drugs or alcohol craving in the past 24 hours. 3      Patient responded to questions and engaged in collaborative conversation. Pt shared that she felt nervous and anxious as well as excited. Pt reported goal of learning something. Pt reported having a good night, spending time with her mother and not drinking. Pt shared concerns about getting back with boyfriend that drank and that her PACS raised because of this concern. Pt noted plans to discuss alcohol with boyfriend before they reunited.     Pt presented in the contemplative stage of change as evidenced by her efforts at treatment and early phase of her recovery.      Charlaine Dalton. Anav Lammert LPC, MAC, NCC, CSAC

## 2017-01-02 ENCOUNTER — Ambulatory Visit (HOSPITAL_BASED_OUTPATIENT_CLINIC_OR_DEPARTMENT_OTHER): Payer: Commercial Managed Care - PPO

## 2017-01-02 VITALS — BP 121/72 | HR 85 | Temp 96.7°F | Resp 18

## 2017-01-02 DIAGNOSIS — G4709 Other insomnia: Secondary | ICD-10-CM

## 2017-01-02 DIAGNOSIS — F1994 Other psychoactive substance use, unspecified with psychoactive substance-induced mood disorder: Secondary | ICD-10-CM

## 2017-01-02 DIAGNOSIS — F1020 Alcohol dependence, uncomplicated: Secondary | ICD-10-CM

## 2017-01-02 DIAGNOSIS — R6889 Other general symptoms and signs: Secondary | ICD-10-CM

## 2017-01-02 NOTE — Progress Notes (Signed)
Topic:Cognitive Behavioral Therapy    Goal: Increased mindfulness, Positive behavioral changes, To increase understanding of when to use judgements and when to let them go.    Intervention:  The concept of nonjudgmental stance was introduced to patients and each patient was encouraged to examine what judgments they were carrying of themselves that continue to upset them, weigh them down, or hold them back. Pt was introduced to different ways to shed negative judgements about self: specifically by using radical acceptance, daily affirmations, and recognizing cognitive distortions that perpetuate negative self-talk. The purpose of mindfulness and the benefits that come with focusing on the presented, rather than the past or future, were discussed. Pt's were able to rehearse applying NJS by changing one of their judgments (either directed towards themselves, or someone else) into a description that allows for acceptance.    Group Comments/Observations:     Attendance: yes  Participation: Active Patient was Cooperative and Engaged;     Notes:     Response to intervention/skills and/or knowledge gained:   By using these skills and techniques, the patient was able to gain perspective on her thoughts, and see how she can positively influence her feelings and behaviors. Overall she noted to have benefited from the session and learned skills that can help with sobriety.     Behavioral Observations: Patient was Cooperative and Engaged; she presented with relaxed mood and mood-congruent affect.      Comments: pt stated that judgmental stance is something she doesn't get attention to and she mentioned that her negative emotions maybe are affected by her self judgement. Pt mentioned that she will work on observing her self talk to monitor her self judge. Pt was able to use facts rather than judgement when she was describing an event in her life.     Johnna Acosta, MA, NCC, Resident in Counseling

## 2017-01-02 NOTE — Progress Notes (Signed)
[facility] - PHP MD  PROGRESS NOTE    Date/Time:  01/02/2017  11:07 AM  Patient Name: Erin Huber, Erin Huber  MRN:  [id number]  Age: 31 y.o.  DOB: 07/08/1985    Vital Signs:     There were no vitals filed for this visit.  Allergies:   No Known Allergies    Chief Complaints:   Patient seen today, chart reviewed and discussed with staff, reports feeling better   Denies relapse and no craving and remains sober  Mood is stable and denies depression and anxiety.  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 She reports that "Alcohol became part of my life and I decided to stop drinking because if started to affect me physically and psychologically".     Current Health Problems:   Patient is a step down from [facility] inpatient unit  She was treated with ativan detox protocol   Mild craving for alcohol this morning  Denies relapse and remains sober until now   Sleeping and appetite have been improving  Mood is stable and denies depression and anxiety   This her first treatment at [facility], never been in any Detox center  As per patient she drinks beers and liquor   She has not taking adderall for more than 3 years  She had been diagnosed with ADHD since grade 3    Review of Systems:   A comprehensive review of systems was negative except for:  Constitutional: positive for fatigue  Eyes: Negative  Ears, nose, mouth, throat, and face: Negative  Respiratory: Negative  Cardiovascular: Negative  Gastrointestinal: Negative  Integument/breast: Negative  Hematologic/lymphatic: Negative  Musculoskeletal: Negative  Neurological: positive for Negative  Behavioral/Psych: positive for anxiety and sleep disturbance  Endocrine: Negative    History of Past Illnesses (Serious illnesses, injuries, surgeries, hospitalizations, including  psychiatric admissions):     Past Medical History:   Diagnosis Date   . Anxiety    . Depression        Family Medical History:     Family History   Problem Relation Age of Onset   . Cancer Maternal Grandmother    . Drug abuse Sister        Social History:     History   Alcohol Use   . Yes     Comment: Drinks 8-10 shots of liquor, 1 bottle of wine and 3-6 hard llemonades daily     History   Drug Use No     History   Smoking Status   . Current Every Day Smoker   . Packs/day: 1.00   . Types: Cigarettes   Smokeless Tobacco   . Never Used     Physical Exam:     General appearance - oriented to person, place, and time   Mental status -  Anxious mood, guarded  Eyes - pupils equal and reactive, extraocular eye movements intact  Ears - bilateral TM's and external ear canals normal  Nose - no septal perforation  Mouth - mucous membranes moist, pharynx normal without lesions  Neck - supple, no significant adenopathy  Chest - clear to auscultation, no wheezes, rales or rhonchi, symmetric air entry  Heart - normal rate, regular rhythm, normal S1, S2, no murmurs, rubs, clicks  or gallops  Abdomen - soft, nontender, nondistended, no masses or organomegaly  Pelvic - deferred to PCP  LMP - No LMP recorded (lmp unknown). Patient is not currently having periods (Reason: Irregular menses).  Is patient pregnant: No  Rectal - deferred to PCP.  Neurological - alert, oriented, normal speech, no focal findings or movement disorder noted, screening mental status exam normal, neck supple without rigidity, cranial nerves II through XII intact, motor and sensory grossly normal bilaterally, normal muscle tone, no tremors, strength 5/5  Musculoskeletal - no joint tenderness, deformity or swelling  Extremities - peripheral pulses normal, no pedal edema, no clubbing or cyanosis  Skin - normal     Mental Status Exam:   Appearance: No apparent distress  Behavior/relationship to examiner/demeanor:  Guarded  Motor activity/EPS:  Normal  Gait:    Normal  Mood (subjective report):   anxious  Affect (objective appearance):  Appropriate/mood-congruent  Fund of Knowledge/Intelligence:   Average  Abstraction:   Normal  Insight:   Fair  Judgment:   Fair      Clinical Diagnosis:    Alcohol abuse and dependence   Substance induced mood disorder   Substance induced insomnia     This patient has been informed of their individual treatment plan.  This includes an explanation of the action of all prescribed psychoactive medications.    List of Prescribed Psychoactive Medications (Antidepressants, Mood Stabilizers, Antipsychotics, Antianxiety, Stimulants):   Trazodone   Vistaril     The benefits, potential side effects, risks, and possible drug interactions were explained to this patient who indicated that she understood and agrees to the plan of care.   All patient questions were answered.    Plan:  Continue treatment at [facility]   Continue Trazodone 75 mg at bedtime if insomnia and Vistaril 25 to 50 mg BID as PRN if anxiety  Daily urine toxicology and breathalyzer   Substance Abuse Counseling  IOP Triage  Discuss with treatment team  Review labs with patient  COUNSEL ON SMOKING CESSATION  OFFER Rx TO STOP SMOKING    I certify that [facility] services are medically necessary for this patient due to Alcohol abuse and Withdrawal and the resultant risk to self.Marland Kitchen Please see H&P and MD progress notes for additional information about the patient's course of treatment. The patient continues to need active treatment furnished by and requiring the supervision of inpatient facility personnel. I certify that [facility] services are medically necessary for treatment which could reasonably be expected to improve the patient's condition. Please see H&P and MD progress notes for additional information about the patient's course of treatment. For Medicare patients, services to be provided in accordance with 412.3 and expected LOS to be greater than 2 midnights for Medicare patients.          Signed by: Micah Flesher, MD   01/02/2017 11:07 AM

## 2017-01-02 NOTE — Progress Notes (Signed)
Topic:Determining Individual social support system   Goal:  improves patient's self awarness, hope,and kindness towards themselves and others.     Intervention:  Art Therpy Intervention  Therapist introduces the art idea of creating a thank you card for some one in their life that support their recovery. Pt has coloring and decoration tools to creat their cards. Pt were provided with time to talk about their support system and the importance of build a healthy social support circles.     Group Comments/Observations:     Attendance: yes  Participation: Active Patient was Cooperative and Engaged;     Notes:     Content of Session: Pt reported to be feeling: calm, friendly and comfortable. Patient's participation in group was appropriate. opportunity to express the importance of different level of support system that will keep her sobriety.     Comment: pt engaged in the activity and she stated that she created a card for her mother for all the support and car she provided her with. Pt mentioned that she needs to work on building a healthy social relationship     [place], [place] Resident in Counseling, [place]

## 2017-01-02 NOTE — Progress Notes (Signed)
Nursing Note: Patient arrived at 54.  VSS, her BAL was negative and her UDS was negative.  She denied any relapse the previous night. She reports she slept six hours last night.    Her CIWA was a 2 at 0907 and a 1 at 1437.  She denied having any SI or HI.    She stated she is interested in attending [facility] after being discharged from [facility].    Pleasant and cooperative, she attended all available groups, and indicated will return to [facility] on Monday.

## 2017-01-02 NOTE — Progress Notes (Signed)
Topic:  Weekend Planning   Goal:  Relapse Prevention   Intervention:  Cognitive/behavioral restructuring    Patient Before Group Check-In:     Pt attended: Yes  Pt participated Yes  (Reader is referred to scanned document in Chart with patient signature.)    Counseling Summary:       Pt and peers participated in weekend planning group. Topics included identification of triggering or difficult situations, plans to utilize safe coping skills, creating structure, listing sober or safe contacts to connect with, listing people and places to avoid, and a few things just for fun.    Pt stated she would need to be aware of the following trigger/s: alcohol.  Pt noted she could use the following coping skills to help manage triggering situations going for walks, deep breathing and distractions. Pt created a structure of events/activities that included movies and getting clothes for winter organized. Pt noted the following sober supports sister and mother. Pt reported a need to avoid the following people, places and things [employer] ,apartment & alcohol. Pt reported intent to have fun by yoga and walking       Pt denied SI/HI.     Charlaine Dalton. Maty Zeisler LPC, MAC, NCC, CSAC

## 2017-01-02 NOTE — Progress Notes (Addendum)
Topic: Touch-Base/Round Robin        Goal:  Build Rapport/establish relationship between therapist and patients      Intervention:  person centered      Group Comments/Observations:     Pt attended: Yes  Pt participation: active     Notes:     Suicide Assessment Tool (SAT)    Thoughts (Suicidal Ideation): verbalizes no current ideation  Plans (Suicidal Ideation): none to occasional thoughts of suicide with no plan  Method (Suicidal Ideation): none  Impulse Control (Behavior Cues): adequate impulse control  Behavioral Activity (Behavior Cues): consistent in behavior or patterns  Preparation for Death (Behavior Cues): none  Predominant Mood or Affect: signs of mild depression or indicators of hopefulness  Mood Stability (Mood/Affect): mood stable  Tolerance of Feelings (Mood/Affect): feelings tolerable  Problem Solving (Cognition/Perception): problem solving intact  Perception (Cognition/Perception): accurate perception of reality  Monitoring/Suicide Alert Level: Routine monitoring    Behavioral Observation: Pt was engaged an participated.       Therapist casually inquired about patient's present condition. General inquires included how pt was doing in their recovery journey, what their previous evening entailed, how they were progressing with their family and other significant relationships and other concerns that were impacting their sobriety. PACS Checklist was completed as reflected below (Pt's have 6 options to answer only their answer is noted below).     1. During the past 24 hours how often have you thought about how good using drugs or alcohol would make you feel? 3  2. At it's most severe point, how severe was your craving during the past 24 hours? 2  3. During the past 24 hours how much time have you spent thinking about using drugs or alcohol or about how good using drugs or alcohol would make you feel? 2  4. During the past 24 hours how difficult would it have been to resist using drugs or alcohol if you  had known some were in the house? 2  5. Keeping in mind your response to the previous questions, please rate your overall average drugs or alcohol craving in the past 24 hours. 2      Patient responded to questions and engaged in collaborative conversation. Pt shared that Pt is grateful for family and [facility] program.    Pt presented in the contemplation stage of change as evidenced by attending group daily, identifying aftercare plans but does not have sober support network.      Gunnar Bulla, Social Work Tax inspector      Attested by Charlaine Dalton. [place], LPC, MAC, NCC, CSAC

## 2017-01-02 NOTE — Progress Notes (Signed)
Topic : Psychoeducation Sleep Hygiene     Goal: Positive behavioral changes, To increase understanding of best practices for sleep     Group Comments/Observations:     Attendance: yes  Participation: Active Patient was Cooperative and Engaged;     Notes:     Response to intervention/skills and/or knowledge gained: Patients were provided handout on elements of sleep hygiene, including personal habits, sleep environment, getting ready for bed, getting up in the middle of the night, and television.  Patients were also provided a worksheet for tracking habits that hinder or help sleep over a one week period to note improvements to sleep when behaviors are modified.  Patients were given an opportunity to share tips with other members and concerns about their sleep habits.      Behavioral Observations: Patient was alert and expresses self well; she presented with appropriate to circumstances mood and mood-congruent affect    Comments: pt stated that she can go to sleep but when she wakes up in the middle of the night or have a bad dreem that affect her night. Pt was able to identify some tips that can help with her sleep hygin . Pt stated that she will try to make her sleep time is the same every night. Pt also mentioned a relaxing activities before sleep can help her.  with Patient was receptive to feedback and provided feedback to others.       Johnna Acosta, MA, NCC, Resident in Counseling

## 2017-01-05 ENCOUNTER — Ambulatory Visit (HOSPITAL_BASED_OUTPATIENT_CLINIC_OR_DEPARTMENT_OTHER): Payer: Commercial Managed Care - PPO

## 2017-01-05 DIAGNOSIS — R6889 Other general symptoms and signs: Secondary | ICD-10-CM

## 2017-01-05 DIAGNOSIS — F10282 Alcohol dependence with alcohol-induced sleep disorder: Secondary | ICD-10-CM

## 2017-01-05 DIAGNOSIS — F1994 Other psychoactive substance use, unspecified with psychoactive substance-induced mood disorder: Secondary | ICD-10-CM

## 2017-01-05 NOTE — Progress Notes (Signed)
Topic: Breaking Down Barriers to Change    Goal:  Increase awareness of barriers to change; Promote behavioral changes    Intervention:  Cognitive/behavioral restructuring; Solutions-Focused Intervention    Group Comments/Observations:     Pt attended: Yes  Pt participation: active: did activity, shared    Notes:     Behavioral Observation: attentive, cooperative and engaged    Pt and peers participated in exercise identifying their personal barriers to change, which included prompts highlighting both external and internal barriers. Pts wrote their barriers on an image of a brick wall and shared their "wall" with the group; pts then identified commonalities among their barriers (low self-esteem, lack of taking action, dishonesty/poor communication, negative influences, fear of failure, isolation, etc) and discussed ways to overcome them. Discussion also included psychoeducation on the stages of change.     Pt shared goals of "be more social and active, find a purpose, believe in myself". Pt identified barriers to change as "drinking, not believing in myself, anxiety about change, how others see me, unmotivated". Pt was receptive to positive feedback and support from peers and staff.    Erin Huber, M.Ed., NCC

## 2017-01-05 NOTE — Progress Notes (Signed)
Topic:  Wellness Recovery Action Plan   Goal:  Identification of cognitive distortions, Increased mindfulness, Positive behavioral changes  Intervention:  Cognitive/behavioral restructuring    Patient Before Group Check-In:     Pt Attended: yes  Pt Participated: yes   (Reader is referred to scanned document in Chart with patient signature.)    Counseling Summary:       Pt and peers participated in sober planning group. Topics included abstinence plan, ways to exercise one's body and mind, something to do to invest in one's recovery, development of connection to others, seeking a greater understanding of self and or others, and something positive that was done for one's recovery.        Pt stated she would stay with her mom for her abstinence plan tonight. Pt reported she would exercise her body and mind by walking. Pt indicated that she would stay sober as something specific for her recovery tonight. Pt stated she would connect with others by time with boyfriend. Pt reported she would seek a better understanding of self and others by processing what she learned.  Pt indicated that she felt good about sharing her feelings today.        Pt denied SI/HI.     Charlaine Dalton. Payton Moder LPC, MAC, NCC, CSAC

## 2017-01-05 NOTE — Progress Notes (Signed)
Topic:Journey to Health  Goal: increase self awareness of obstacles on their road to recovery    Intervention: Writer explained expressive therapy activity, contrasting with talk therapy. Patients were prompted to depict their current mental health state, what obstacles they anticipate on their road to recovery, and what their destination of health looks like. The metaphor of driving was used for this group and obstacles such as drinking, self-harm thoughts, negative self-talk, few social supports, or being alone were components that lead to a bumpy road or sink holes preventing them from reaching their recovery goals. Each group member shared their drawing and talked about their journey to health. Writer facilitated discussion with each group member on how to overcome some of the obstacles and utilize support systems. Group members then added ways to overcome obstacles on their drawing to better represent their journey with a more accurate representation of supports or coping skills they can use to guide them towards healing and health.  Group members provided feedback to each other.    Group Comments/Observations:     Attendance: yesParticipation: Active Patient was Cooperative and Engaged;     Notes:     Comments: pt stated that she feels positive about things that she can do for her recovery. Pt mentioned a lack of social support and she stated that she needs to find a sponsor and a close by group.           Johnna Acosta, MA, NCC, Resident in Counseling

## 2017-01-05 NOTE — Progress Notes (Signed)
Nursing Note: Patient arrived at 49.  VSS, her BAL was negative and her UDS was negative.  She denied any relapse over the weekend. She reports she slept five hours last night. She stated she stayed up to watch television.    Her CIWA was a 3 at 1039 and a 2 at 1453.  She denied having any SI or HI.    Pleasant and cooperative, she attended all available groups, and indicated will return to [facility] tomorrow.

## 2017-01-05 NOTE — Progress Notes (Signed)
Topic: Touch-Base/Round Robin        Goal:  Build Rapport/establish relationship between therapist and patients      Intervention:  person centered      Group Comments/Observations:     Pt attended: Yes  Pt participation: active     Notes:     Suicide Assessment Tool (SAT)    Thoughts (Suicidal Ideation): verbalizes no current ideation  Plans (Suicidal Ideation): none to occasional thoughts of suicide with no plan  Method (Suicidal Ideation): none  Impulse Control (Behavior Cues): adequate impulse control  Behavioral Activity (Behavior Cues): consistent in behavior or patterns  Preparation for Death (Behavior Cues): none  Predominant Mood or Affect: signs of mild depression or indicators of hopefulness  Mood Stability (Mood/Affect): mood stable  Tolerance of Feelings (Mood/Affect): feelings tolerable  Problem Solving (Cognition/Perception): problem solving intact  Perception (Cognition/Perception): accurate perception of reality  Monitoring/Suicide Alert Level: Routine monitoring    Behavioral Observation: Patient was engaged and participated actively in the group.     Therapist casually inquired about patient's present condition. General inquires included how pt was doing in their recovery journey, what their previous evening entailed, how they were progressing with their family and other significant relationships and other concerns that were impacting their sobriety. PACS Checklist was completed as reflected below (Pt's have 6 options to answer only their answer is noted below).     1. During the past 24 hours how often have you thought about how good using drugs or alcohol would make you feel? 2  2. At it's most severe point, how severe was your craving during the past 24 hours? 2  3. During the past 24 hours how much time have you spent thinking about using drugs or alcohol or about how good using drugs or alcohol would make you feel? 1  4. During the past 24 hours how difficult would it have been to resist  using drugs or alcohol if you had known some were in the house? 1  5. Keeping in mind your response to the previous questions, please rate your overall average drugs or alcohol craving in the past 24 hours. 2      Patient responded to questions and engaged in collaborative conversation. Pt shared that she continues to have the same feeling of nervousness and excitement but stated her excitement exceeds her nervousness today. Pt expressed starting to feel less anxious and better.Pt shared her goal is to learn something new in group.    Pt presented in the contemplation stage of change as evidenced by not having a sober support network but Pts shows up to group and has identified aftercare plans.     Gunnar Bulla, Social Work Intern

## 2017-01-05 NOTE — Progress Notes (Signed)
[facility] - PHP MD  PROGRESS NOTE    Date/Time:  01/05/2017  3:25 PM  Patient Name: Erin Huber, CORBIT  MRN:  [id number]  Age: 31 y.o.  DOB: July 03, 1985    Vital Signs:     Vitals:    01/05/17 1453   BP: 127/77   Pulse: 85   Resp: 18   Temp:      Allergies:   No Known Allergies    Chief Complaints:   Patient seen today, chart reviewed and discussed with staff. During the weekend, reports feeling better   Denies relapse and no craving and remains sober  Mood is stable and denies depression and anxiety.  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 She reports that "Alcohol became part of my life and I decided to stop drinking because if started to affect me physically and psychologically".     Current Health Problems:   Patient is a step down from [facility] inpatient unit  She was treated with ativan detox protocol   Mild craving for alcohol this morning  Denies relapse and remains sober until now   Sleeping and appetite have been improving  Mood is stable and denies depression and anxiety   This her first treatment at [facility], never been in any Detox center  As per patient she drinks beers and liquor   She has not taking adderall for more than 3 years  She had been diagnosed with ADHD since grade 3    Review of Systems:   A comprehensive review of systems was negative except for:  Constitutional: positive for fatigue  Eyes: Negative  Ears, nose, mouth, throat, and face: Negative  Respiratory: Negative  Cardiovascular: Negative  Gastrointestinal: Negative  Integument/breast: Negative  Hematologic/lymphatic: Negative  Musculoskeletal: Negative  Neurological: positive for Negative  Behavioral/Psych: positive for anxiety and sleep  disturbance  Endocrine: Negative    History of Past Illnesses (Serious illnesses, injuries, surgeries, hospitalizations, including psychiatric admissions):     Past Medical History:   Diagnosis Date   . Anxiety    . Depression        Family Medical History:     Family History   Problem Relation Age of Onset   . Cancer Maternal Grandmother    . Drug abuse Sister        Social History:     History   Alcohol Use   . Yes     Comment: Drinks 8-10 shots of liquor, 1 bottle of wine and 3-6 hard llemonades daily     History   Drug Use No     History   Smoking Status   . Current Every Day Smoker   . Packs/day: 1.00   . Types: Cigarettes   Smokeless Tobacco   . Never Used     Physical Exam:     General appearance - oriented to person, place, and time   Mental status -  Anxious mood, guarded  Eyes - pupils equal and reactive, extraocular eye movements intact  Ears - bilateral TM's and external ear canals normal  Nose - no septal perforation  Mouth - mucous membranes moist, pharynx normal without  lesions  Neck - supple, no significant adenopathy  Chest - clear to auscultation, no wheezes, rales or rhonchi, symmetric air entry  Heart - normal rate, regular rhythm, normal S1, S2, no murmurs, rubs, clicks or gallops  Abdomen - soft, nontender, nondistended, no masses or organomegaly  Pelvic - deferred to PCP  LMP - No LMP recorded (lmp unknown). Patient is not currently having periods (Reason: Irregular menses).  Is patient pregnant: No  Rectal - deferred to PCP.  Neurological - alert, oriented, normal speech, no focal findings or movement disorder noted, screening mental status exam normal, neck supple without rigidity, cranial nerves II through XII intact, motor and sensory grossly normal bilaterally, normal muscle tone, no tremors, strength 5/5  Musculoskeletal - no joint tenderness, deformity or swelling  Extremities - peripheral pulses normal, no pedal edema, no clubbing or cyanosis  Skin - normal     Mental Status Exam:    Appearance: No apparent distress  Behavior/relationship to examiner/demeanor:  Guarded  Motor activity/EPS:  Normal  Gait:   Normal  Mood (subjective report):   anxious  Affect (objective appearance):  Appropriate/mood-congruent  Fund of Knowledge/Intelligence:   Average  Abstraction:   Normal  Insight:   Fair  Judgment:   Fair      Clinical Diagnosis:    Alcohol abuse and dependence   Substance induced mood disorder   Substance induced insomnia     This patient has been informed of their individual treatment plan.  This includes an explanation of the action of all prescribed psychoactive medications.    List of Prescribed Psychoactive Medications (Antidepressants, Mood Stabilizers, Antipsychotics, Antianxiety, Stimulants):   Trazodone   Vistaril     The benefits, potential side effects, risks, and possible drug interactions were explained to this patient who indicated that she understood and agrees to the plan of care.   All patient questions were answered.    Plan:  Continue treatment at [facility]   Continue Trazodone 75 mg at bedtime if insomnia and Vistaril 25 to 50 mg BID as PRN if anxiety  Daily urine toxicology and breathalyzer   Substance Abuse Counseling  IOP Triage  Discuss with treatment team  Review labs with patient  COUNSEL ON SMOKING CESSATION  OFFER Rx TO STOP SMOKING    I certify that [facility] services are medically necessary for this patient due to Alcohol abuse and Withdrawal and the resultant risk to self.Marland Kitchen Please see H&P and MD progress notes for additional information about the patient's course of treatment. The patient continues to need active treatment furnished by and requiring the supervision of inpatient facility personnel. I certify that [facility] services are medically necessary for treatment which could reasonably be expected to improve the patient's condition. Please see H&P and MD progress notes for additional information about the patient's course of treatment. For Medicare  patients, services to be provided in accordance with 412.3 and expected LOS to be greater than 2 midnights for Medicare patients.         Signed by: Micah Flesher, MD   01/05/2017 3:25 PM

## 2017-01-06 ENCOUNTER — Telehealth (HOSPITAL_BASED_OUTPATIENT_CLINIC_OR_DEPARTMENT_OTHER): Payer: Self-pay

## 2017-01-06 ENCOUNTER — Other Ambulatory Visit: Payer: Self-pay | Admitting: Psychiatry

## 2017-01-06 ENCOUNTER — Ambulatory Visit (HOSPITAL_BASED_OUTPATIENT_CLINIC_OR_DEPARTMENT_OTHER): Payer: Commercial Managed Care - PPO

## 2017-01-06 ENCOUNTER — Encounter (HOSPITAL_BASED_OUTPATIENT_CLINIC_OR_DEPARTMENT_OTHER): Payer: Commercial Managed Care - PPO | Admitting: Psychiatry

## 2017-01-06 VITALS — BP 119/76 | HR 88 | Temp 96.2°F | Resp 18

## 2017-01-06 DIAGNOSIS — F10282 Alcohol dependence with alcohol-induced sleep disorder: Secondary | ICD-10-CM

## 2017-01-06 DIAGNOSIS — R6889 Other general symptoms and signs: Secondary | ICD-10-CM

## 2017-01-06 DIAGNOSIS — F1994 Other psychoactive substance use, unspecified with psychoactive substance-induced mood disorder: Secondary | ICD-10-CM

## 2017-01-06 NOTE — Progress Notes (Signed)
Topic: Life Goals      Goal:  Identification of coping skills   Intervention:  Motivational Interviewing    Group Comments/Observations:     Pt attended: Yes  Pt participation: active: shared     Notes:     Behavioral Observation: respectful, cooperative      Pt was instructed to place seven life goals on a task sheet. They were encouraged to consider how much time and energy they would have at their disposal as a result of their sobriety. Pt's were invited to consider going in a new direction and leave the old ways behind.     Pt's goals included doing more for others, getting an apartment and staying sober.      Pt received positive feedback from peers and staff.    Charlaine Dalton. Shaquon Gropp LPC, MAC, NCC, CSAC

## 2017-01-06 NOTE — Progress Notes (Signed)
Nursing Note: Patient arrived at 42.  VSS, her BAL was negative and her UDS was negative.  She denied any relapse the previous night. She reports she slept five and a half hours last night.    Her CIWA was a 2 at 0853 and a 2 at 1317. She denied having any SI or HI.     Pleasant and cooperative, she attended all available groups.  Patient was discharged per MD order at the end of program day. She was given a copy of the discharge instructions and she verbalized understanding.    Her aftercare plan is to attend [facility]. She has an appointment tomorrow 01/07/17 at 1000 for [facility] IOP orientation. Her group meets weekly every Monday, Wednesday and Friday from 1030 am to 130 pm for ten weeks for a total of 30 sessions.    Patient was A&O x 4 and medically stable at the time of discharge. Will continue to monitor.

## 2017-01-06 NOTE — Progress Notes (Signed)
Discharge Counseling Note:    Patient participated in medically monitored structured recovery support via [facility] as a transition from inpatient services. Medical therapy included Vistaril. Pt. was individually assessed and treatment plans were developed. Pt. had been offered education on the disease process of addition, relapse behaviors, self-defeating learned behaviors, defense mechanisms and post acute withdrawal symptoms. Pt. was directed to attend all 12-step meetings and educational groups/lectures. Patient began day treatment level of care on 12/29/2016. Patient was referred to this level of care by the inpatient treatment team at [facility].  Patient made the following progress towards meeting treatment goals: participation in groups, development of coping skills that included mindfulness, grounding and thought restructuring. Patient's behavior in group was seemingly appropriate albeit nervous. Pt's aftercare plan was to participate in [facility] with primary counselor Rolla Plate [phone] with first group scheduled on 01/07/2017 at 10:30 AM.  Patient did not involve her family in treatment as she did not consent.  Patient presented in the contemplativestage of change as evidenced by willingness to seek treatment and desire to stop drinking ("I need to stop drinking"); however has limited insight into her disease and is struggling with the concept of alcoholism.    Charlaine Dalton. Mosetta Ferdinand, LPC, MAC, NCC, CSAC

## 2017-01-06 NOTE — Progress Notes (Signed)
[facility] - PHP MD  PROGRESS NOTE    Date/Time:  01/06/2017  10:14 AM  Patient Name: Erin Huber, Erin Huber  MRN:  [id number]  Age: 31 y.o.  DOB: [birth year]    Vital Signs:     There were no vitals filed for this visit.  Allergies:   No Known Allergies    Chief Complaints:   Patient seen today, chart reviewed and discussed with staff, reports feeling better   Denies relapse and no craving and remains sober  Mood is stable and denies depression and anxiety.  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 She reports that "Alcohol became part of my life and I decided to stop drinking because if started to affect me physically and psychologically".     Current Health Problems:   Patient is a step down from [facility] inpatient unit  She was treated with ativan detox protocol   Mild craving for alcohol this morning  Denies relapse and remains sober until now   Sleeping and appetite have been improving  Mood is stable and denies depression and anxiety   This her first treatment at [facility], never been in any Detox center  As per patient she drinks beers and liquor   She has not taking adderall for more than 3 years  She had been diagnosed with ADHD since grade 3    Review of Systems:   A comprehensive review of systems was negative except for:  Constitutional: positive for fatigue  Eyes: Negative  Ears, nose, mouth, throat, and face: Negative  Respiratory: Negative  Cardiovascular: Negative  Gastrointestinal: Negative  Integument/breast: Negative  Hematologic/lymphatic: Negative  Musculoskeletal: Negative  Neurological: positive for Negative  Behavioral/Psych: positive for anxiety and sleep disturbance  Endocrine: Negative    History of Past Illnesses  (Serious illnesses, injuries, surgeries, hospitalizations, including psychiatric admissions):     Past Medical History:   Diagnosis Date   . Anxiety    . Depression        Family Medical History:     Family History   Problem Relation Age of Onset   . Cancer Maternal Grandmother    . Drug abuse Sister        Social History:     History   Alcohol Use   . Yes     Comment: Drinks 8-10 shots of liquor, 1 bottle of wine and 3-6 hard llemonades daily     History   Drug Use No     History   Smoking Status   . Current Every Day Smoker   . Packs/day: 1.00   . Types: Cigarettes   Smokeless Tobacco   . Never Used     Physical Exam:     General appearance - oriented to person, place, and time   Mental status -  Anxious mood, guarded  Eyes - pupils equal and reactive, extraocular eye movements intact  Ears - bilateral TM's and external ear canals normal  Nose - no septal perforation  Mouth - mucous membranes moist, pharynx normal without lesions  Neck - supple, no significant adenopathy  Chest - clear to auscultation, no wheezes, rales or rhonchi, symmetric  air entry  Heart - normal rate, regular rhythm, normal S1, S2, no murmurs, rubs, clicks or gallops  Abdomen - soft, nontender, nondistended, no masses or organomegaly  Pelvic - deferred to PCP  LMP - No LMP recorded (lmp unknown). Patient is not currently having periods (Reason: Irregular menses).  Is patient pregnant: No  Rectal - deferred to PCP.  Neurological - alert, oriented, normal speech, no focal findings or movement disorder noted, screening mental status exam normal, neck supple without rigidity, cranial nerves II through XII intact, motor and sensory grossly normal bilaterally, normal muscle tone, no tremors, strength 5/5  Musculoskeletal - no joint tenderness, deformity or swelling  Extremities - peripheral pulses normal, no pedal edema, no clubbing or cyanosis  Skin - normal     Mental Status Exam:   Appearance: No apparent distress  Behavior/relationship to  examiner/demeanor:  Guarded  Motor activity/EPS:  Normal  Gait:   Normal  Mood (subjective report):   anxious  Affect (objective appearance):  Appropriate/mood-congruent  Fund of Knowledge/Intelligence:   Average  Abstraction:   Normal  Insight:   Fair  Judgment:   Fair      Clinical Diagnosis:    Alcohol abuse and dependence   Substance induced mood disorder   Substance induced insomnia     This patient has been informed of their individual treatment plan.  This includes an explanation of the action of all prescribed psychoactive medications.    List of Prescribed Psychoactive Medications (Antidepressants, Mood Stabilizers, Antipsychotics, Antianxiety, Stimulants):   Trazodone   Vistaril     The benefits, potential side effects, risks, and possible drug interactions were explained to this patient who indicated that she understood and agrees to the plan of care.   All patient questions were answered.    Plan:  Discharge from [facility]   Continue Trazodone 75 mg at bedtime if insomnia and   Vistaril 25 to 50 mg BID as PRN if anxiety  Daily urine toxicology and breathalyzer   Substance Abuse Counseling  IOP Triage  Discuss with treatment team  Review labs with patient  COUNSEL ON SMOKING CESSATION  OFFER Rx TO STOP SMOKING    I certify that [facility] services are medically necessary for this patient due to Alcohol abuse and Withdrawal and the resultant risk to self.Marland Kitchen Please see H&P and MD progress notes for additional information about the patient's course of treatment. The patient continues to need active treatment furnished by and requiring the supervision of inpatient facility personnel. I certify that [facility] services are medically necessary for treatment which could reasonably be expected to improve the patient's condition. Please see H&P and MD progress notes for additional information about the patient's course of treatment. For Medicare patients, services to be provided in accordance with 412.3 and expected  LOS to be greater than 2 midnights for Medicare patients.         Signed by: Micah Flesher, MD   01/06/2017 10:14 AM

## 2017-01-06 NOTE — Progress Notes (Signed)
[facility] Discharge and Collaborative Plan       Date:  01/06/2017  Time: 11:01 AM  Patient Name:  Erin Huber  31 y.o.   October 30, 1985      Date of Admission:  12/29/16  Discharge Physician: Dr. Clearence Cheek     Labs:     Lab Results   Component Value Date    ALT 36 12/24/2016    WBC 5.71 12/24/2016    RBC 2.62 (L) 12/24/2016    HGB 10.6 (L) 12/24/2016    HCT 31.4 (L) 12/24/2016    MCV 119.8 (H) 12/24/2016    PLT 141 12/24/2016    AST 77 (H) 12/24/2016    GGT 1,393 (H) 12/22/2016    ALB 2.9 (L) 12/24/2016    MG 1.7 12/23/2016    RPR Non Reactive 12/22/2016       Vaccination Results:  There is no immunization history for the selected administration types on file for this patient.  Pneumococcal:  Not Indicated  Influenza:  Not Indicated  TB Test:  Not Tested  Date Given:  N/A             Jazz, Biddy   Home Medication Instructions ZDG:[id number]    Printed on:01/06/17 1101   Medication Information                      allopurinol (ZYLOPRIM) 100 MG tablet  Take 100 mg by mouth daily.             hydrOXYzine (VISTARIL) 25 MG capsule  Take 1 capsule (25 mg total) by mouth 2 (two) times daily as needed for Anxiety.             traZODone (DESYREL) 150 MG tablet  Take 0.5 tablets (75 mg total) by mouth nightly as needed for Sleep.             verapamil (VERELAN PM) 120 MG 24 hr capsule  Take 120 mg by mouth daily.                   Disposition/aftercare/Referral Plan:   "Well I'm going to IOP, I'm staying with my mom and remember the feeling I have, feeling good and feeling normal without anything helping you."    Follow-up plan for sobriety was discussed with the patient.    Discharge Instructions:     Discharge instructions given to Mishal:  Yes.    Questions that may arise between hospital discharge and your first follow-up appointment should be directed to [facility] [phone].    In event that you have a seizure upon discharge please call 911 and/or proceed to the nearest  hospital.    Discharge Plan:     Referrals:  Comments   Continued Care Program  Phone #: [phone] yes   [facility]    Date:  01/07/17     Time:  1000 am    Counselor:  Rolla Plate   Group meets weekly every Monday, Wednesday and Friday from 1030 am to 130 pm for ten weeks for a total of 30 sessions.       Therapist/Psychiatrist  Phone #:  no Name and contact info:    Medical Appointment  Phone #:  yes Doctor: Primary Care Doctor  Date and Time: 01/19/17        TOPIC INSTRUCTION METHOD OUTCOME COMMENTS   DIET General  Discussion Learned Eat a well balanced diet.   SAFETY  Potential Seizures  Discussion Learned If you have any problems call 911 or go to an emergency room.     Patient Signature_____________________________ date 01/06/2017  11:01 AM    Nurse Signature________________________________date 01/06/2017 11:01 AM    Everlena Cooper RN  01/06/2017  11:01 AM

## 2017-01-06 NOTE — Progress Notes (Addendum)
Topic: Touch-Base/Round Robin        Goal:  Build Rapport/establish relationship between therapist and patients      Intervention:  person centered      Group Comments/Observations:     Pt attended: Yes  Pt participation: active     Notes:     Suicide Assessment Tool (SAT)    Thoughts (Suicidal Ideation): verbalizes no current ideation  Plans (Suicidal Ideation): none to occasional thoughts of suicide with no plan  Method (Suicidal Ideation): none  Impulse Control (Behavior Cues): adequate impulse control  Behavioral Activity (Behavior Cues): consistent in behavior or patterns  Preparation for Death (Behavior Cues): none  Predominant Mood or Affect: signs of mild depression or indicators of hopefulness  Mood Stability (Mood/Affect): mood stable  Tolerance of Feelings (Mood/Affect): feelings tolerable  Problem Solving (Cognition/Perception): problem solving intact  Perception (Cognition/Perception): accurate perception of reality  Monitoring/Suicide Alert Level: Routine monitoring    Behavioral Observation: nervous       Therapist casually inquired about patient's present condition. General inquires included how pt was doing in their recovery journey, what their previous evening entailed, how they were progressing with their family and other significant relationships and other concerns that were impacting their sobriety. PACS Checklist was completed as reflected below (Pt's have 6 options to answer only their answer is noted below).     1. During the past 24 hours how often have you thought about how good using drugs or alcohol would make you feel? 2  2. At it's most severe point, how severe was your craving during the past 24 hours? 1  3. During the past 24 hours how much time have you spent thinking about using drugs or alcohol or about how good using drugs or alcohol would make you feel? 1  4. During the past 24 hours how difficult would it have been to resist using drugs or alcohol if you had known some were in  the house? 1  5. Keeping in mind your response to the previous questions, please rate your overall average drugs or alcohol craving in the past 24 hours. 1      Patient responded to questions and engaged in collaborative conversation. Pt shared that she felt happy, excited and good. Pt noted she stayed sober and had a visit from her boyfriend last night. Pt reported she had a goal to keep positive and be kind to others. Pt reported she was thankful for her family and for [facility].    Pt presented in the contemplative stage of change as evidenced by her engagement in treatment and early phase of her development.      Charlaine Dalton. Angele Wiemann LPC, MAC, NCC, CSAC

## 2017-01-06 NOTE — Telephone Encounter (Signed)
Writer contacted pt to inform her that her Vivitrol had been delivered to the clinic so that she could make her appt for administration ( around beginning of November). Pt also needs labs done prior to injection. Generic Voice msg left for pt to contact the clinic regarding the above.

## 2017-01-06 NOTE — Progress Notes (Signed)
Topic:  Wellness Recovery Action Plan   Goal:  Identification of cognitive distortions, Increased mindfulness, Positive behavioral changes  Intervention:  Cognitive/behavioral restructuring    Patient Before Group Check-In:     Pt Attended: yes  Pt Participated: yes   (Reader is referred to scanned document in Chart with patient signature.)    Counseling Summary:       Pt and peers participated in sober planning group. Topics included abstinence plan, ways to exercise one's body and mind, something to do to invest in one's recovery, development of connection to others, seeking a greater understanding of self and or others, and something positive that was done for one's recovery.        Pt stated she would stay with her mom for her abstinence plan tonight. Pt reported she would exercise her body and mind by going for a walk. Pt indicated that she would stay sober as something specific for her recovery tonight. Pt stated she would connect with others by talking with boyfriend. Pt reported she would seek a better understanding of self and others by remembering feedback.  Pt indicated that she felt good about sharing today.        Pt denied SI/HI.     Charlaine Dalton. Tykeem Lanzer LPC, MAC, NCC, CSAC

## 2017-01-06 NOTE — Progress Notes (Signed)
Topic: Bio-Psycho-Social Aspects of Addiction        Goal:  Increased understanding of the treatment of addiction         Intervention:  psycho education     Group Comments/Observations:     Pt attended: Yes  Pt participation: seemed to listen, asked question, shared     Notes:     Behavioral Observation: Cooperative         Pt's were invited to write questions on a piece of paper and drop them in a cardboard box. Therapist addressed questions with help of group. Questions mostly reflected nature of addiction and mental illness and recovery therefrom.     In general the therapist described the impact of addiction from a bio-psycho-social perspective. An appropriate combination of medication, behavioral and social interventions were described.  Therapist explained the nature of addictive disorders and how they interacted with biological psychological and social domains to include the need for addressing treatment in a multifaceted approach.      Charlaine Dalton. Lyncoln Ledgerwood LPC, MAC, NCC, CSAC
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
## 2017-01-07 ENCOUNTER — Ambulatory Visit: Payer: Commercial Managed Care - PPO | Attending: Psychiatry | Admitting: Licensed Professional Counselor

## 2017-01-07 ENCOUNTER — Ambulatory Visit: Payer: Commercial Managed Care - PPO

## 2017-01-07 ENCOUNTER — Other Ambulatory Visit (INDEPENDENT_AMBULATORY_CARE_PROVIDER_SITE_OTHER): Payer: Self-pay | Admitting: Psychiatry

## 2017-01-07 DIAGNOSIS — F329 Major depressive disorder, single episode, unspecified: Secondary | ICD-10-CM | POA: Insufficient documentation

## 2017-01-07 DIAGNOSIS — F10282 Alcohol dependence with alcohol-induced sleep disorder: Secondary | ICD-10-CM | POA: Insufficient documentation

## 2017-01-07 DIAGNOSIS — F19282 Other psychoactive substance dependence with psychoactive substance-induced sleep disorder: Secondary | ICD-10-CM | POA: Insufficient documentation

## 2017-01-07 DIAGNOSIS — F17200 Nicotine dependence, unspecified, uncomplicated: Secondary | ICD-10-CM | POA: Insufficient documentation

## 2017-01-07 DIAGNOSIS — F1024 Alcohol dependence with alcohol-induced mood disorder: Secondary | ICD-10-CM | POA: Insufficient documentation

## 2017-01-07 DIAGNOSIS — F419 Anxiety disorder, unspecified: Secondary | ICD-10-CM | POA: Insufficient documentation

## 2017-01-07 DIAGNOSIS — F1020 Alcohol dependence, uncomplicated: Secondary | ICD-10-CM | POA: Insufficient documentation

## 2017-01-07 DIAGNOSIS — F1924 Other psychoactive substance dependence with psychoactive substance-induced mood disorder: Secondary | ICD-10-CM | POA: Insufficient documentation

## 2017-01-07 NOTE — Progress Notes (Signed)
Topic:"Mental Mistakes"/Unhelpful Thinking styles  Goal:  Understanding the CBT triangle and learn how to challenge the unhelpful thoughts   Intervention:  Interactive didactic instruction and skill application/rehearsal; Clinical research associate reviewed CBT triangle with the group, where feelings, thoughts and behaviors are interrelated, develop patterns over time, and as regards the change process, a positive change in one of the 3 elements can result in a positive change in the other two. Pts completed handouts used to explore common unhelpful thinking styles. Pts identified unhelpful thoughts and reflected on the impact these thoughts have on them as individuals and in the context of their relationships. Writer facilitated group discussion on the different types of unhelpful thinking styles and group members were able to identify what styles they gravitate towards (e.g., filtering, jumping to conclusions, overgeneralizing, magnifying and minimizing, personalizing, black-and-white thinking, catastrophizing, mind reading, rule making, emotional thinking, labeling). Patients practiced alternative responses and questioning as ways to combat unhelpful thinking styles.    Group Comments/Observations:     Attendance: yes  Participation: Active Patient was Cooperative and Engaged;     Notes:     Comments: " Using the [facility] approach (attention, recognize, challenge, change), PT learned a new way to pratice thought-stopping and reframing techniques  Pt stated that she feels anxious because she think in the future in a negative way. Pt was able to re-frame her thoughts and she stated that she can plan for the future but she can't control everything around her and that is fine.     Johnna Acosta, MA, NCC, Resident in Counseling

## 2017-01-07 NOTE — Progress Notes (Signed)
Topic:  Art as Therapy     Goal:  Develop self-soothing and grounding skills; distress tolerance; increase affective expression and self-efficacy.     Intervention:  Art therapy directive- open studio approach     Group Comments/Observations:       Pt attended: Yes  Pt participation: Active     Notes:       Behavioral Observation:  Engaged and Cooperative         This Clinical research associate introduced herself to new group members and briefly described how art therapy stimulates different areas of the brain and can help patients to develop self-soothing skills and self-awareness.   This Writer applied an open studio approach, in which patients chose which art materials they wanted to use and what they wanted to create.  Patients appeared self-directed and focused, as they explored the different media (color pencils, color markers, Model Magic clay, construction paper, and found objects).  The group listened to music during art making.  Afterwards, patients talked about their respective experiences with the art making process.  This Writer acknowledged the group's accomplishment in creating artwork and strengthening rapport among patients.     Pt presented with bright affect and enthusiasm as she chose to use watercolors, which she described as "fun."  Pt appeared immersed as she painted an image of "things that I like," such as "water," which is reportedly self-soothing, and butterflies.  Pt received validating and constructive feedback from peers and counselor.     Larene Beach, LCSW

## 2017-01-07 NOTE — Progress Notes (Signed)
01/07/17  IOP Group Note: Patient arrived on time for orientation to her first IOP-F group and presented with normal mood and a little anxious affect. Patient exhibited no unusual behaviors. Patient was oriented to person, place, time/date and situation. Pt remained in group from 10:30 am to 13:30 pm. Pt was dressed appropriately for group and exhibited good hygiene. Patient denied any SI/HI. Patient reported cravings level as 1/5.     Pt did participate in group check-in process and reported that she is doing ok. She shared her sobriety date is 12/19/16 and this is her first experience with sobriety (she shared she went to detox and day treatment before coming to IOP). Her drinking caused her some health issues after "drinking all day every day for 5 years." Her doctor wanted her to get help.She shared she is on Vivitrol so her liver must be healing.       This process group content consisted of psychoeducation and psychotherapeutic techniques.   This group's topic was "Addiction and Recovery-The [place] Curve" and "Self-Diagnosis." Group reviewed their symptoms on the left side of the chart, having to do with the progression of their disease, and then the upward curve of recovery symptoms.  The symptoms from the DSM V were also briefly discussed to compare. Group then held a discussion of their specific symptoms. She recognized the slow increase in alcohol tolerance and black outs. She hasn't done much yet on the recovery side. Patient did appear to benefit from group discussion and activities, as evidenced by the positive feedback and participation during all group activities. There was one Task presentation and group welcomed 2 new members today.     Progress toward treatment goals for today's session regarding early recovery noted as:  Patient looked at the progression of the disease of addiction and also the progression of what their recovery could look like and where to focus on improving recovery.      Patient did not report attending any sober support meetings this past week.  Patient did not report having sponsor.  Pt group note stated:  "Don't have to have a drink. I enjoyed Task 4, gave me a better understanding."    Pt addressed the following treatment plan objectives: patient learned about progression of the disease of addiction and of recovery.     Pt's breathalyzer was negative.  Pt's UDS was negative.    Pt was on time for orientation prior to start of group. She completed orientation papers, including 24 hour treatment plan, PACS, TASR, and the PTSD civilian check list.

## 2017-01-08 LAB — HEPATIC FUNCTION PANEL
ALT: 38 IU/L — ABNORMAL HIGH (ref 0–32)
AST (SGOT): 43 IU/L — ABNORMAL HIGH (ref 0–40)
Albumin: 4.6 g/dL (ref 3.5–5.5)
Alkaline Phosphatase: 73 IU/L (ref 39–117)
Bilirubin Direct: 0.13 mg/dL (ref 0.00–0.40)
Bilirubin, Total: 0.2 mg/dL (ref 0.0–1.2)
Protein, Total: 7.3 g/dL (ref 6.0–8.5)

## 2017-01-08 LAB — SPECIMEN STATUS REPORT

## 2017-01-09 ENCOUNTER — Ambulatory Visit: Payer: Commercial Managed Care - PPO | Admitting: Licensed Professional Counselor

## 2017-01-09 DIAGNOSIS — F1020 Alcohol dependence, uncomplicated: Secondary | ICD-10-CM

## 2017-01-09 NOTE — Progress Notes (Signed)
01/09/17  IOP Group Note: Patient arrived on time to group IOP-F and presented with normal mood and appropriate affect. Patient exhibited no unusual behaviors. Patient was oriented to person, place, time/date and situation. Pt remained in group from 10:30 am to 13:30 pm. Pt was dressed appropriately for group and exhibited good hygiene. Patient denies any SI/HI. Patient reported cravings level as 1 or 2/5.     Pt did participate in group check in and reported that she has 22 days sober today.She is staying with her mom currently, and has been going with her after group to the store or go for walks. She is working on getting healthy, which includes working out. She is trying to do push ups, has to do them on her knees. She and her mom might do a "work out class." She is currently not working, is "on leave" from [employer]. She has been encouraged to talk with Human Resources at work to make sure she is protected. She has not started going to meetings yet.    This process group content consisted of psychoeducation and psychotherapeutic techniques. This group's topic included Social Support and Relationships. Group was given a worksheet for with names of categories of people who might be in their life: Significant other, friends, siblings, 12 Step groups, neighbor, and so on. They determined if this person was being the most supportive or not of their recovery efforts.  Then they had concentric circles in which to place the people: who is the most important, who they feel closest with. Patient only had about 5 people who support her and are in the inner circle, and her boyfriend is also a drinker. She might spend time with him this weekend and she shared he has stopped drinking to support her. "It doesn't bother me if he drinks around me."  This counselor led the group in a discussion about the topic.  Group also reviewed the handout "Social Support: Tap this tool to reduce stress" and the importance of social  support in recovery. Patient did appear to benefit from group discussion and activities, as evidenced by the positive feedback and participation during all group activities. There were 3 new people welcomed to group today.    Progress toward treatment goals for today's session regarding Social Support: Patient listened actively to the discussion on the importance of having sober social support to alleviate stress and to help them through recovery, have sober fun, alleviate boredom. Patient's treatment goals as they relate to the topic of this group are: Counselor will provide information about the importance of sober social support.    Patient did not report attending any sober support meetings this past week. Patient did not report having sponsor. Pt wrote on group note: "New medication for cholesterol..it was too high. Didn't bring it with me so I don't remember what it's called. Just wanting to keep learning about this disease and coping skills. Got a list of AA meetings" (picked up a Where and When). "Remember 1 day at a time."     Pt addressed the following treatment plan objectives: Patient made efforts towards understanding that having sober social support is crucial for an enhanced recovery.    Patient's breathalyzer was negative.

## 2017-01-12 ENCOUNTER — Ambulatory Visit: Payer: Commercial Managed Care - PPO | Admitting: Licensed Professional Counselor

## 2017-01-12 DIAGNOSIS — F1020 Alcohol dependence, uncomplicated: Secondary | ICD-10-CM

## 2017-01-13 ENCOUNTER — Encounter: Payer: Self-pay | Admitting: Licensed Professional Counselor

## 2017-01-13 NOTE — Progress Notes (Signed)
Transfer Integrated Summary      Name:  Erin Huber, Erin Huber  Date of Birth: Jul 23, 1985  Medical Record Number:  [id number]  Level of Care: IOP  Admission Date: 01/07/17  Group: IOP-F     Initial Justification for Diagnosis: Erin Huber is a 31 y.o. single Caucasian female presenting with diagnosis of Alcohol use disorder, severe, dependence and treatment. This was patient's third treatment episode at [facility] and third treatment episode overall. Patient reports presenting problem for coming to [facility] as "doctor called and told me to go to the hospital, I was extra low in potassium" (from drinking).     Biopsychosocial Summary of Assessments:   At time of admission to IOP, patient does not report SI/HI . Patient does not report any past SI/HI . Patient denies history of self-harm or injurious behavior . Patient reports history of mental health issues to include anxiety. Patient was given the TASR upon admission to IOP and was a low level of suicide risk with routine monitoring/suicide level alert. Patient reports the following protective factors of employed, abstinent from substances.  Patient was also given PTSD Checklist Civilian Version on 01/07/17 and scored a "5-extremely" on 0 items, a "4-quite a bit" on 0 items a "3-moderate" on 0 items, a "2-little bit" on 0 items and a "1-not at all" on 17 items. Patient scored a total of 17 which may not indicate potential PTSD diagnosis. This Clinical research associate will follow up with patient to assess for PTSD further and discuss appropriate treatment planning recommendations.     Precipitating Event:   Patient reports precipitating event for coming to [facility] IOP Level of care was  Step down from Day Treatment.     Patient reports the following clinically relevant problems or psychosocial stressors upon admission: "doctor called and told me to go to the hospital, I was extra low in potassium.     Patient reports short term goal(s) of treatment as follows: "stop drinking, go to AA, learn how  to deal with my emotions."    Patient reports long term goal(s) of treatment as follows: "stop drinking, get a car, new apartment, go to AA."     Patient's perception of addiction/problem at this time: "not good..high liver enzymes, low in all vitamins, loss of muscle mass."    Patient reports cultural background/spiritual preferences as: "it influences me by realizing how great I feel naturally without  alcohol, and that I don't have to depend on it. Realizing what's more important in life than drinking."     Patient identifies three strengths: "I look mor positive than negative. I'm friendly. I'm caring."    Patient's preference for learning: "doing"    Patient reports motivation for treatment as: "my health, the respect of my family and friends. Don't want to have to depend on alcohol."      Current Medications: Verapamil, ezetimibe, allopurinol, hydroxyzine, trazodone     Patient Stage of Change:  Patient presentedin the contemplativestage of change as evidenced by willingness to seek treatment and desire to stop drinking ("I need to stop drinking"); however has limited insight into her disease and is struggling with the concept of alcoholism.     Patient's urine drug screen results upon admission to IOP level of care were negative for all substances and patient's breathalyzer results were Negative with a BAL of .000.        Diagnosis: Alcohol use disorder, severe dependence  Nicotine dependence                       Anxiety                      Depression           Recommendations for Treatment:   Patient will be provided with education on: craving management, trigger identification, HALT, cross addiction, relapse prevention, physiological effects of drugs and alcohol, managing emotions and thoughts in recovery, disease model of addiction and boundary setting skills  Patient will be encouraged to attend minimum of 3 sober self-help support groups per week, work with a sponsor, and develop  sober network   Patient will be encouraged to complete a minimum of 30 group sessions for SA treatment in the [facility] IOP-F group with a start date of 01/07/17  Patient will be encouraged to consider individual therapy at some point

## 2017-01-13 NOTE — Progress Notes (Signed)
01/12/17  IOP Group Note: Patient arrived on time to group IOP-F and presented with normal mood and appropriate affect. Patient exhibited no unusual behaviors. Patient was oriented to person, place, time/date and situation. Pt remained in group from 10:30 am to 13:30 pm. Pt was dressed appropriately for group and exhibited good hygiene. Patient denies any SI/HI. Patient reported cravings level as 1/5.     Pt did participate in group check in and reported that after group on Friday, she went shopping with her mom, then went over to her boyfriend's apartment. She found 2 mini bottles there, and when asked what she did with them, she said she left them there. It was suggested that she ask her boyfriend to throw them out. She then went back to her mom's and watched the pro football game.    This process group content consisted of psychoeducation and psychotherapeutic techniques. This group's topic concluded Social Support and Relationships, Characteristics of the Co-Dependent in Relationships.  This counselor led the group in a discussion about the topic. Pt shared she related to an item on the Characteristics of the Co-Dependent in Relationships: "we believe almost nothing is too much trouble, takes too much time, or is too expensive if it will help the man/woman we are involved with." Group completed reviewing and discussing the importance of having a sober and understanding social support network in recovery. Patient did appear to benefit from group discussion and activities, as evidenced by the positive feedback and participation during all group activities. There were 2 Task presentations and 2 new people were welcomed to group today.    Progress toward treatment goals for today's session regarding Social Support: Patient listened actively to the discussion on the importance of having sober social support to help with recovery, have sober fun, alleviate boredom. Patient's treatment goals as they relate to the topic  of this group are: Counselor will provide information about the importance of sober social support.    Patient did not report attending any sober support meetings this past week. Patient did not report having sponsor. Pt wrote on group note: "new medication for high cholesterol:Ezetimibe tablets. Not exercising is just as bad as smoking."      Pt addressed the following treatment plan objectives: Patient made efforts towards understanding that having sober social support is crucial for an enhanced recovery.    Patient's breathalyzer was negative.

## 2017-01-14 ENCOUNTER — Ambulatory Visit: Payer: Commercial Managed Care - PPO | Admitting: Licensed Professional Counselor

## 2017-01-14 DIAGNOSIS — F1020 Alcohol dependence, uncomplicated: Secondary | ICD-10-CM

## 2017-01-14 DIAGNOSIS — F1590 Other stimulant use, unspecified, uncomplicated: Secondary | ICD-10-CM

## 2017-01-14 NOTE — Progress Notes (Signed)
01/14/17  IOP Group Note: Patient arrived on time to group IOP-F and presented with normal mood and appropriate affect. Patient exhibited no unusual behaviors. Patient was oriented to person, place, time/date and situation. Pt remained in group from 10:30 am to 13:30 pm. Pt was dressed appropriately for group and exhibited good hygiene. Patient denies any SI/HI. Patient reported cravings level as 1/5.     Pt did participate in group check in and reported she is "fine, good." Then when asked to use other terms, she shared she felt "hopeful, excited." She went to the gym, but only for 20 minutes. Group said, "at least it's a start." She also had breakfast, "the first time in years." She went to buy winter boots and a coat. She hasn't made it to any meetings yet, but her mom asked if she could go with her. She still hasn't spoken to HR about her job status. All have encouraged her to do so today.    This process group content consisted of psychoeducation and psychotherapeutic techniques. This group's topic included Self-diagnosis.   Counselor provided brief psychoeducational instruction about definitions of abstinence, experimentation, social/recreational use, habituation, abuse, and dependence, self-diagnosis and diagnostic criteria. Group went over some of the symptoms for each stage of addiction: early, middle, late. The Group then engaged in a discussion about identifying their own diagnoses, using a DSM 5 Reference Sheet, and their symptoms. Pt reported that she was on Ritalin since 3rd grade, then was given Adderall. 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 Pt was given a list of the families of drugs (alcohol, sedative-hypnotic, narcotic, stimulant, hallucinogens, cannabis, and inhalants) to be aware of cross addictions. Patient did appear to benefit from group discussion and  activities, as evidenced by the positive feedback and participation during all group activities.      Progress toward treatment goals for today's session regarding self-diagnosis noted as: Patient listened actively to the discussion on self-diagnosis.  Counselor will provide information about the criteria for substance abuse diagnosis; pt will learn how to self-diagnose, which she did.     Patient did not report attending any sober support meetings this past week. Patient did not report having sponsor. Pt wrote on group note: "alcohol is a primary disease."     Pt addressed the following treatment plan objectives: Patient made efforts towards learning about addiction by identifying the diagnostic criteria for the levels of dependence for their drug/drugs of choice.     Pt's breathalyzer was negative.  Patient's UDS was negative.    Pt reviewed and signed her initial treatment plan.

## 2017-01-15 ENCOUNTER — Ambulatory Visit (INDEPENDENT_AMBULATORY_CARE_PROVIDER_SITE_OTHER): Payer: Commercial Managed Care - PPO | Admitting: Psychiatry

## 2017-01-15 ENCOUNTER — Other Ambulatory Visit (HOSPITAL_BASED_OUTPATIENT_CLINIC_OR_DEPARTMENT_OTHER): Payer: Self-pay | Admitting: Psychiatry

## 2017-01-15 DIAGNOSIS — F1024 Alcohol dependence with alcohol-induced mood disorder: Secondary | ICD-10-CM

## 2017-01-15 DIAGNOSIS — F10282 Alcohol dependence with alcohol-induced sleep disorder: Secondary | ICD-10-CM

## 2017-01-15 DIAGNOSIS — R6889 Other general symptoms and signs: Secondary | ICD-10-CM

## 2017-01-15 MED ORDER — HYDROXYZINE PAMOATE 25 MG PO CAPS
25.0000 mg | ORAL_CAPSULE | Freq: Two times a day (BID) | ORAL | 0 refills | Status: AC | PRN
Start: 2017-01-15 — End: 2017-02-14

## 2017-01-15 NOTE — Progress Notes (Signed)
[facility] - AMBULATORY  MD PROGRESS NOTE    Date/Time:  01/15/2017  3:46 PM  Patient Name: Erin Huber, Erin Huber  MRN:  [id number]  Age: 31 y.o.  DOB: 06/23/1985    Vital Signs:     There were no vitals filed for this visit.  Allergies:   No Known Allergies    Chief Complaints:   Patient seen today, came requesting for anxiety medication.   This is a well known patient to the writer while she was at [facility]  Feels anxious , if she stressed out or without taking her medication (Vistaril)   Denies relapse and no craving and remains sober  Mood is stable and denies depression. .  She is already enrolled to IOP    Reason of admission to [facility]  This is a 18 year old white single, employed as "waitress" presents to this clinic as "step down" from inpatient unit after being treated from September 30 to October 05 /2018. She reports that "Alcohol became part of my life and I decided to stop drinking because if started to affect me physically and psychologically".     Current Health Problems:   Patient is a step down from [facility] inpatient unit  She was treated with ativan detox protocol   Mild craving for alcohol this morning  Denies relapse and remains sober until now   Sleeping and appetite have been improving  Mood is stable and denies depression and anxiety   This her first treatment at [facility], never been in any Detox center  As per patient she drinks beers and liquor   She has not taking adderall for more than 3 years  She had been diagnosed with ADHD since grade 3    Review of Systems:   A comprehensive review of systems was negative except for:  Constitutional: positive for fatigue  Eyes: Negative  Ears, nose, mouth, throat, and face: Negative  Respiratory: Negative  Cardiovascular: Negative  Gastrointestinal: Negative  Integument/breast: Negative  Hematologic/lymphatic: Negative  Musculoskeletal: Negative  Neurological: positive for Negative  Behavioral/Psych: positive for anxiety and sleep disturbance  Endocrine:  Negative    History of Past Illnesses (Serious illnesses, injuries, surgeries, hospitalizations, including psychiatric admissions):     Past Medical History:   Diagnosis Date   . Anxiety    . Depression        Family Medical History:     Family History   Problem Relation Age of Onset   . Cancer Maternal Grandmother    . Drug abuse Sister        Social History:     History   Alcohol Use   . Yes     Comment: Drinks 8-10 shots of liquor, 1 bottle of wine and 3-6 hard llemonades daily     History   Drug Use No     History   Smoking Status   . Current Every Day Smoker   . Packs/day: 1.00   . Types: Cigarettes   Smokeless Tobacco   . Never Used     Physical Exam:     General appearance - oriented to person, place, and time   Mental status -  Anxious mood, guarded  Eyes - pupils equal and reactive, extraocular eye movements intact  Ears - bilateral TM's and external ear canals normal  Nose - no septal perforation  Mouth - mucous membranes moist, pharynx normal without lesions  Neck - supple, no significant adenopathy  Chest - clear to auscultation,  no wheezes, rales or rhonchi, symmetric air entry  Heart - normal rate, regular rhythm, normal S1, S2, no murmurs, rubs, clicks or gallops  Abdomen - soft, nontender, nondistended, no masses or organomegaly  Pelvic - deferred to PCP  LMP - No LMP recorded (lmp unknown). Patient is not currently having periods (Reason: Irregular menses).  Is patient pregnant: No  Rectal - deferred to PCP.  Neurological - alert, oriented, normal speech, no focal findings or movement disorder noted, screening mental status exam normal, neck supple without rigidity, cranial nerves II through XII intact, motor and sensory grossly normal bilaterally, normal muscle tone, no tremors, strength 5/5  Musculoskeletal - no joint tenderness, deformity or swelling  Extremities - peripheral pulses normal, no pedal edema, no clubbing or cyanosis  Skin - normal     Mental Status Exam:   Appearance: No apparent  distress  Behavior/relationship to examiner/demeanor:  Guarded  Motor activity/EPS:  Normal  Gait:   Normal  Mood (subjective report):   anxious  Affect (objective appearance):  Appropriate/mood-congruent  Fund of Knowledge/Intelligence:   Average  Abstraction:   Normal  Insight:   Fair  Judgment:   Fair      Clinical Diagnosis:    Alcohol abuse and dependence   Substance induced mood disorder   Substance induced insomnia     This patient has been informed of their individual treatment plan.  This includes an explanation of the action of all prescribed psychoactive medications.    List of Prescribed Psychoactive Medications (Antidepressants, Mood Stabilizers, Antipsychotics, Antianxiety, Stimulants):   Trazodone   Vistaril     The benefits, potential side effects, risks, and possible drug interactions were explained to this patient who indicated that she understood and agrees to the plan of care.   All patient questions were answered.    Plan:  She is already enrolled to IOP  Continue Trazodone 75 mg at bedtime if insomnia and   Vistaril 25 to 50 mg BID as PRN if anxiety  Substance Abuse Counseling  Discuss with treatment team  Review labs with patient  COUNSEL ON SMOKING CESSATION  OFFER Rx TO STOP SMOKING          Signed by: Micah Flesher, MD   01/15/2017 3:46 PM

## 2017-01-16 ENCOUNTER — Ambulatory Visit: Payer: Commercial Managed Care - PPO | Admitting: Licensed Professional Counselor

## 2017-01-16 DIAGNOSIS — F1020 Alcohol dependence, uncomplicated: Secondary | ICD-10-CM

## 2017-01-16 DIAGNOSIS — F1590 Other stimulant use, unspecified, uncomplicated: Secondary | ICD-10-CM

## 2017-01-16 NOTE — Progress Notes (Signed)
01/16/17  IOP Group Note: Patient arrived on time to IOP-F group and presented with normal mood and appropriate affect. Patient exhibited no unusual behaviors. Patient was oriented to person, place, time/date and situation. Pt remained in group from 10:30 am to 13:30 pm. Pt was dressed appropriately for group and exhibited good hygiene. Patient denies any SI/HI. Patient reported cravings level as 1/5.     Pt did participate in group check-in process and reported that she is doing well. Her liver functions are apparently getting better, she shared. She saw the [facility] psychiatrist and was given Vistaril for anxiety. "Not much is going on." She did call her HR office at work to discuss her leave and disability, but stated she had to leave a message. When asked if she had gone to any meetings, she replied, "there are thoughts about going to meetings." She wondered what BB meeting meant and was informed. She admitted, when asked, that she is getting bored somewhat since she isn't working. She is doing things with her mom to stay busy      This process group content consisted of psychoeducation and psychotherapeutic techniques. This group's topic included Core Beliefs. The purpose of today's group activity was to help the patients identify from their family of origin core beliefs they may have internalized: how they see themselves, others, and their world. The patient was given information verbally about core beliefs, and in handouts ("What Are Core Beliefs" and "Adjusting Negative Core Beliefs"), with which to follow the discussion and ask questions. Pt was given a worksheet to identify personal core beliefs: "I am.," "Others are." and "the World is.," (to share) and a worksheet to identify negative core beliefs (to share if they wanted). Pt reported "I am caring, vulnerable, self-conscious; others are believed to be good, uncontrollable; the world is unpredictable." From the Rate Yourself sheet, she shared "People have  always come and gone in my life. That's just the way it is in life."  Group watched the first half of Pleasure Unwoven, a movie that explains the disease of addiction (group had asked to watch it). Pt did appear to benefit from group discussion and activities, as evidenced by the positive feedback and participation during all group activities.     Progress toward treatment goals for today's session regarding Core Beliefs noted as: Patient listened actively to the discussion on Core Beliefs. Patient's treatment goals as they relate to the topic of this group are: Counselor will provide information about recognizing Core Beliefs as a step toward changing them, if desired. Pt learned more about the disease concept of addiction from the movie, Pleasure Unwoven.    Patient did not report attending any sober support meetings this past week. Patient did not report having sponsor. Pt group note stated: "I enjoy coming to these meetings--it's nice to share with people who know how you feel. Learned from Pleasure Unwoven."    Pt addressed the following treatment plan objectives: Pt identified core beliefs about self, others, and the world. Pt explored more positive ways to look at self, others, and the world. Patient made efforts to learn more about the disease concept of addiction.    Pt's breathalyzer was negative.

## 2017-01-18 ENCOUNTER — Encounter (HOSPITAL_BASED_OUTPATIENT_CLINIC_OR_DEPARTMENT_OTHER): Payer: Self-pay | Admitting: Psychiatry

## 2017-01-18 NOTE — Progress Notes (Signed)
I, Dr Suanne Marker hereby attest that this patient meets ASAM criteria for IOP level of care admission to the [facility] ambulatory program at this time.   I, Dr Suanne Marker, also attest that the patient receiving treatment, Erin Huber, is fully aware of and verbally consents to, voluntary treatment in the [facility] program at this time.

## 2017-01-19 ENCOUNTER — Ambulatory Visit: Payer: Commercial Managed Care - PPO | Admitting: Licensed Professional Counselor

## 2017-01-19 ENCOUNTER — Encounter (HOSPITAL_BASED_OUTPATIENT_CLINIC_OR_DEPARTMENT_OTHER): Payer: Self-pay | Admitting: Psychiatry

## 2017-01-19 DIAGNOSIS — F1020 Alcohol dependence, uncomplicated: Secondary | ICD-10-CM

## 2017-01-19 DIAGNOSIS — F1590 Other stimulant use, unspecified, uncomplicated: Secondary | ICD-10-CM

## 2017-01-19 NOTE — Progress Notes (Signed)
01/19/17  IOP Group Note: Patient arrived on time to IOP-F group and presented with normal mood and appropriate affect. Patient exhibited no unusual behaviors. Patient was oriented to person, place, time/date and situation. Pt remained in group from 10:30 am to 13:30 pm. Pt was dressed appropriately for group and exhibited good hygiene. Patient denies any SI/HI. Patient reported cravings level as 1/5.    Pt did participate in group check-in process and reported she couldn't remember what she did on Friday after group. In the evening she did end up watching the longest baseball game, and a [employer], on Friday evening. She spent time with her boyfriend watching movies. She shared she did finally pour out the remaining miniature alcohol bottles she had. She watched the football game with her boyfriend on Sat before returning to her mom's place.      This process group content consisted of psychoeducation and psychotherapeutic techniques. This group's topic concluded Core Beliefs and group finished watching Pleasure Unwoven.  The purpose of today's group activity was to help the patients see what Irrational Core Beliefs could look like, as they reviewed a list of 12.  Group watched the second half of Pleasure Unwoven, a movie that explains the disease of addiction. There was one Task presentation also.  Pt did appear to benefit from group discussion and activities, as evidenced by the positive feedback and participation during all group activities. For homework the group was tasked with completing the Coat of Arms exercise.     Progress toward treatment goals for today's session regarding Core Beliefs noted as: Patient listened actively to the discussion on Irrational Core Beliefs and looked to see if they might relate to any. Patient's treatment goals as they relate to the topic of this group are: Counselor will provide information about recognizing Core Beliefs as a step toward changing them, if desired. Pt  learned more about the disease concept of addiction from the movie, Pleasure Unwoven.    Patient did not  report attending any sober support meetings this past week. Patient did not report having sponsor. Pt group note stated: "drug cues. Pleasure Unwoven."    Pt addressed the following treatment plan objectives: Pt  made efforts to recognize any Irrational Core Beliefs and learned more about the disease of addiction.    Pt's breathalyzer was negative.

## 2017-01-21 ENCOUNTER — Ambulatory Visit: Payer: Commercial Managed Care - PPO | Admitting: Licensed Professional Counselor

## 2017-01-21 DIAGNOSIS — F1020 Alcohol dependence, uncomplicated: Secondary | ICD-10-CM

## 2017-01-21 DIAGNOSIS — F1590 Other stimulant use, unspecified, uncomplicated: Secondary | ICD-10-CM
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
## 2017-01-23 ENCOUNTER — Encounter: Payer: Self-pay | Admitting: Licensed Professional Counselor

## 2017-01-23 ENCOUNTER — Ambulatory Visit: Payer: Commercial Managed Care - PPO
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
## 2017-01-26 ENCOUNTER — Ambulatory Visit: Payer: Commercial Managed Care - PPO | Attending: Psychiatry | Admitting: Licensed Professional Counselor

## 2017-01-26 ENCOUNTER — Ambulatory Visit (INDEPENDENT_AMBULATORY_CARE_PROVIDER_SITE_OTHER): Payer: Commercial Managed Care - PPO

## 2017-01-26 ENCOUNTER — Encounter (HOSPITAL_BASED_OUTPATIENT_CLINIC_OR_DEPARTMENT_OTHER): Payer: Self-pay

## 2017-01-26 VITALS — BP 118/77 | Ht 64.0 in | Wt 130.6 lb

## 2017-01-26 DIAGNOSIS — F419 Anxiety disorder, unspecified: Secondary | ICD-10-CM | POA: Insufficient documentation

## 2017-01-26 DIAGNOSIS — F329 Major depressive disorder, single episode, unspecified: Secondary | ICD-10-CM | POA: Insufficient documentation

## 2017-01-26 DIAGNOSIS — F1590 Other stimulant use, unspecified, uncomplicated: Secondary | ICD-10-CM

## 2017-01-26 DIAGNOSIS — F17200 Nicotine dependence, unspecified, uncomplicated: Secondary | ICD-10-CM | POA: Insufficient documentation

## 2017-01-26 DIAGNOSIS — F1020 Alcohol dependence, uncomplicated: Secondary | ICD-10-CM

## 2017-01-26 MED ORDER — ALIAS MED E5
380.0000 mg | INTRAMUSCULAR | Status: DC
Start: 2017-01-26 — End: 2019-06-13
  Administered 2017-01-26 – 2017-03-02 (×2): 380 mg via INTRAMUSCULAR

## 2017-01-26 NOTE — Progress Notes (Signed)
Outpatient Medication Management   Non-Psychotropic Injection Progress Note    Name: Erin Huber     03-05-1986    Medical Records: [id number]     BP 118/77 (BP Site: Right arm, Patient Position: Sitting, Cuff Size: Medium)   Ht 1.626 m (5\' 4" )   Wt 59.2 kg (130 lb 9.6 oz)   LMP 01/19/2017 (Approximate)   BMI 22.42 kg/m   No Known Allergies     Physician/Psychiatrist:  [place]    Does she need to be seen by a doctor today?  No    Toxicology Testing Log 01/19/2017 01/21/2017   Breathalyzer Positive/Negative: Negative Negative   Breathalyzer results - -   Drug Screen Positive/Negative: - Negative       Results requested by MD? No    Assessment:  Presenting Mental Status  Orientation Level: Oriented X4  Memory: No Impairment  Thought Content: normal  Thought Process: normal  Behavior: normal  Consciousness: Alert  Impulse Control: normal  Perception: normal  Eye Contact: normal  Attitude: cooperative  Mood: normal  Hopelessness Affects Goals: No  Hopelessness About Future: No  Affect: normal  Speech: normal  Concentration: normal  Insight: good  Judgment: good  Appearance: normal  Appetite: normal  Weight change?: normal  Energy: normal  Sleep: normal  Reliability of Reporter/Patient: good    Risk Assessment:     Suicidal Ideation:  None reported/observed    Homicidal Ideation:  None reported/observed    Self-Harm Behavior:  None reported/observed    Current Outpatient Prescriptions   Medication Sig Dispense Refill   . azelastine (OPTIVAR) 0.05 % ophthalmic solution   1   . ezetimibe (ZETIA) 10 MG tablet TAKE 1 TABLET BY MOUTH EVERY DAY  2   . hydrOXYzine (VISTARIL) 25 MG capsule Take 1 capsule (25 mg total) by mouth 2 (two) times daily as needed for Anxiety. 60 capsule 0   . traZODone (DESYREL) 150 MG tablet Take 0.5 tablets (75 mg total) by mouth nightly as needed for Sleep. 15 tablet 0   . verapamil (VERELAN PM) 120 MG 24 hr capsule Take 120 mg by mouth daily.     Marland Kitchen allopurinol (ZYLOPRIM) 100 MG tablet Take  100 mg by mouth daily.     . nicotine (NICODERM CQ) 21 MG/24HR Place 1 patch onto the skin daily. 28 patch 0      - MAR ACTION REPORT  (last 24 hrs)         Govanni Plemons, RN       Medication Name Action Time Action Site Route Rate Dose Reason Comments User     alias med E5 injection 380 mg 01/26/17 1345 Given Left Ventrogluteal Intramuscular  380 mg  May 2021 Miquel Dunn, [place]                Box Lot Number:  [id number]    Box Expiration Date: 30Sept2020        Was injection site intact prior to injection administration?  yes  How did she tolerate injection?  excellent            Notes: Patient denies relapse, cravings at 1. Denies suicidal/homicidal ideation, a/v hallucinations. Sleep (6-7hr a night)/appetite good. UDS/Breathalyzer done in IOP today.    Return in about 28 days (around 02/23/2017).    Miquel Dunn, RN
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
## 2017-01-28 ENCOUNTER — Ambulatory Visit: Payer: Commercial Managed Care - PPO

## 2017-01-28 DIAGNOSIS — F1020 Alcohol dependence, uncomplicated: Secondary | ICD-10-CM

## 2017-01-28 NOTE — Progress Notes (Signed)
01/28/17  IOP Group Note: Patient arrived on time to IOP-F group and presented with normal mood and appropriate affect. Patient exhibited no unusual behaviors. Patient was oriented to person, place, time/date and situation. Pt remained in group from 10:30-13:30. Pt was dressed appropriately for group and exhibited good hygiene. Patient denies any SI/HI. Patient reported cravings level as 1/5.     Pt did participate in group check-in process and reported that she "stayed home the last couple days, watched movies, did some errands".      This process group content consisted of psychoeducation and psychotherapeutic techniques. This group's topic was Defense Mechanisms.  Pt was given guidelines for handout on Defense mechanism definitions and behaviors.  Group discussed examples from their lives, described their most frequently used defense mechanisms.  And pt was given instruction on how to implement the skill of "reflective listening", through therapist example, and then through a partner exercise with group members in pairs.     Erin Huber was very attentive, and involved in group discussion and feedback.     Progress toward treatment goals for today's session regarding Interpersonal relationships noted as: Patient listened actively to and participated in the discussion on Defense Mechanisms. Patient's treatment goals as they relate to the topic of this group are: Counselor led the group on the various topics previously described, and also healthy ways to reach"natural calm and natural highs", such as exercise, sauna, meditation, nature, reading, etc.      Progress toward treatment goals for today's session regarding early recovery noted as: Patient did not report attending sober support meetings this past week.  Patient did not report having contacts with a sponsor. Pt group note stated:  "Natural Calms and Natural Highs". She also wrote on another worksheet, "I would like to learn about how to express myself more,  speak stronger."     Erin Blase, MA Therapist in Residence

## 2017-01-30 ENCOUNTER — Ambulatory Visit: Payer: Commercial Managed Care - PPO | Admitting: Licensed Professional Counselor

## 2017-01-30 DIAGNOSIS — F1020 Alcohol dependence, uncomplicated: Secondary | ICD-10-CM

## 2017-01-30 NOTE — Progress Notes (Signed)
Patient in to clinic per writer's request for random UDS/HCG testing. Breathalyzer/UDS/HCG negative.

## 2017-01-30 NOTE — Progress Notes (Signed)
01/30/17  IOP Group Note: Patient arrived on time to group IOP-F and presented with normal mood and appropriate affect. Patient exhibited no unusual behaviors. Patient was oriented to person, place, time/date and situation. Pt remained in group from 10:30 am to 13:30 pm. Pt was dressed appropriately for group and exhibited good hygiene. Patient denies any SI/HI. Patient reported cravings level as 1/5.     Pt did participate in group check in and reported she was good, but not much is going on. She went to the gym today, then had breakfast. She reported, when asked, that she has called HR, awaits a call back. Pt was also asked if she wanted to return to that job, and she voiced uncertainty. She shared she may get a job at [employer] that is basically across the street, and it doesn't serve alcohol. She shared she is in good spirits, excited about being sober. She is feeling peaceful spiritually.    This process group content consisted of psychoeducation and psychotherapeutic techniques. This group's topic included Relapse Warning Signs/Creating a Safety Plan-- Red Flags/Green Flags: Signs of Danger versus Safety.  Pts read the sheet "Key Points about Red and Green Flags," and then reviewed the Signs of Danger versus Safety, and then created a Safety Plan using Red Flags of Mild Danger, Moderate Danger, and Serious Danger. Pt shared hers. Group was given information on BUDD: Building up to drink or drug and identified more symptoms of BUDD (leading to relapse). Pt reported feeling remorseful and irritable would be symptoms of BUDD for her. Pt did appear to benefit from group discussion and activities, as evidenced by the positive feedback and participation during all group activities.  Group was given homework on creating a more thorough safety plan using the information gleaned from [employer].     Progress toward treatment goals for today's session regarding Relapse Prevention/Creating a Safety  Plan noted as: Patient listened actively to the discussion on Relapse prevention, Red Flags (Danger)/Green Flags (Safety). Patient's treatment goals as they relate to the topic of this group are: Counselor will provide information about how to create a safety plan to prevent relapse by using Red Flags/Green Flags.     Patient did not report attending any sober support meetings this past week. Patient did not report having sponsor.  Pt wrote on group note: "being more understanding. LISTEN-hear what people are saying."     Pt addressed the following treatment plan objectives:  Patient began creating a safety plan for relapse prevention to add to her coping skills.     Patient's breathalyzer was negative.

## 2017-02-02 ENCOUNTER — Ambulatory Visit: Payer: Commercial Managed Care - PPO | Admitting: Licensed Professional Counselor

## 2017-02-02 DIAGNOSIS — F1020 Alcohol dependence, uncomplicated: Secondary | ICD-10-CM

## 2017-02-02 DIAGNOSIS — F1590 Other stimulant use, unspecified, uncomplicated: Secondary | ICD-10-CM

## 2017-02-03 NOTE — Progress Notes (Signed)
02/02/17  IOP Group Note: Patient arrived on time to group IOP-F and presented with normal mood and appropriate affect. Patient exhibited no unusual behaviors. Patient was oriented to person, place, time/date and situation. Pt remained in group from 10:30 am to 13:30 pm. Pt was dressed appropriately for group and exhibited good hygiene. Patient denies any SI/HI. Patient reported cravings level as 1/5.     Pt did participate in group check in and reported that she is great. It was her boyfriend's birthday on Friday. He had to work in the day, then they went out to eat dinner with his family. On Sunday they spent time with his family in [place], watched the game, went out to eat. She also helped prepare for the 02/21/17 baby shower for her boyfriend's sister-in-law. Baby is due 04/11/17.     This process group content consisted of psychoeducation and psychotherapeutic techniques. This group's topic included Relapse Prevention and Stuck Points, and begin Relapse Warning Signs (RWS). "The Promises" of AA were handed out and discussed; what have they received, what would they like to receive. Patient shared: she would like to "not regret the past nor wish to shut the door on it," for the things she did when in active addiction. Counselor went over the definitions of abstinence, recovery, relapse, and a dry drunk. Group compared the differences between Abstinence and Recovery. Group was given a work sheet on [employer] and a picture of boulders to fill in representing what boulders are getting in the way of their sobriety. Pt reported her biggest boulder was "my anxiety/thoughts." She shared she has so much anxiety, that it gets in the way of her ever thinking about driving again. She went with her mother to the store over the weekend, then they went to another large store, and she just had to go home, never entering the second store. She added these items to the rocks and stones around the boulder: arguments with my  mom, convenience of stores that sell alcohol, my feelings, no license/car, not being alone (inability to have alone time or silence), and money.  She was encouraged to make an appointment to discuss her anxiety and possible options with the [facility] psychiatrist.Group reviewed the list of 39 Relapse Warning Signs, and for homework were to pare it down to 5 and come up with counter measures. Patient did appear to benefit from group discussion, as evidenced by the positive feedback and participation during all group activities.     Progress toward treatment goals for today's session regarding Relapse Prevention noted as: Patient listened actively to the discussion on Relapse Prevention and Stuck Points.  Patient's treatment goals as they relate to the topic of this group are: Counselor will provide information about Relapse as a Process and factors leading up to it, including not moving on past their stuck points..     Patient did not report attending any sober support meetings this past week. Patient did not report having sponsor. Pt wrote on group note: she didn't write anything.     Pt addressed the following treatment plan objectives: Patient made efforts towards identifying and understanding personal Stuck Points and that Relapse is a process.    Pt's breathalyzer was negative.

## 2017-02-04 ENCOUNTER — Ambulatory Visit: Payer: Commercial Managed Care - PPO | Admitting: Licensed Professional Counselor

## 2017-02-04 DIAGNOSIS — F1020 Alcohol dependence, uncomplicated: Secondary | ICD-10-CM

## 2017-02-04 DIAGNOSIS — F1590 Other stimulant use, unspecified, uncomplicated: Secondary | ICD-10-CM

## 2017-02-04 NOTE — Progress Notes (Signed)
02/04/17  IOP Group Note: Patient arrived on time to group IOP-F and presented with normal mood and appropriate affect. Patient exhibited no unusual behaviors. Patient was oriented to person, place, time/date and situation. Pt remained in group from 10:30 am to 10:30 pm. Pt was dressed appropriately for group and exhibited good hygiene. Patient denies any SI/HI. Patient reported cravings level as 1/5.     Pt did participate in group check in and reported she got her nails done yesterday, went with her mother to the doctor. Then they went for frozen yogurt. She has an appointment with the [facility] psychiatrist on 02/11/17 to talk about her anxiety and possible medications.     This process group content consisted of psychoeducation and psychotherapeutic techniques. This group's topic included the Process and Progression of Relapse. There was a quick review of Recovery vs Relapse, the important differences between them, and also what is abstinence, and a dry drunk. Group went over the Relapse Progression step by step: Change-Stress-Denial-PAWS triggered-Change in behavior-Breakdown in Social Structure-Loss of control, judgement and behavior- Option Reduction-Acute Degeneration-Drinking/Using.  This counselor led the group in a discussion about the topic, and patient did engage in the discussion with the group. Patient did appear to benefit from group discussion and activities, as evidenced by the positive feedback and participation during all group activities.  There were 2 Task presentations.    Progress toward treatment goals for today's session regarding Relapse Prevention noted as: Patient listened actively to and participated in the discussion on Relapse prevention. Patient's treatment goals as they relate to the topic of this group are: Counselor will provide information about the process of and relapse progression and the coping skills necessary to maintain recovery..    Patient did not report attending any sober  support meetings this past week. Patient did not report having sponsor. Pt wrote on group note: she didn't write anything.    Pt addressed the following treatment plan objectives: Patient made efforts towards identifying and managing ways to prevent relapse.    Pt's breathalyzer was negative. Pt's UDS was negative.    Pt completed and presented Task 2. "One thing I like about myself is I'm caring. One thing others like about me is I'm genuine. One thing I do well is word find (puzzles). When I'm at my best I feel confident/positive. I'm glad that I found/get to be a part of [facility]. A compliment that has been paid to me recently is I have nice nails. A value that I try hard to practice is be kind to others. An example of my care about others is be yourself (don't be afraid to help). People can count on me to be dependable. They say I did a good job when I haven't drank since 12/19/16. Something I'm handling better this year than last is standing up for myself. One thing that I've overcome is alcohol use. A good example of my ability to manage my life is being on time and accountable. I'm best with people when I'm at my best. One goal I'm presently working toward is positive future. A recent temptation that I managed to overcome was not drinking the minis I found. I pleasantly surprised myself when I didn't have a drink at dinner. I think I have the guts to go to a meeting. If I have to say one good thing about myself, I'd say I'm loyal. One way I successfully control my emotions is breathing, remember to treat others in a way they want  to be treated. One important thing I intend to do within 2 months is go through my clothes and things and throw out/donate them." She couldn't find answers for 3 answers; group tried to help. "My biggest problem is indecisiveness. 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 "One of my other  problems is drinking too much. Something I do that gives me troubles is not being assertive. Something I fail to do that gets me into trouble is just say no. The social setting of life I find most troublesome is I don't really have any friends any more. The most frequent negative feelings in my life are regret. They take place when I take stuff with out asking. The person I have the most trouble with is my sister. What I find most troublesome in this relationship is she's very controlling. Life would be better if I had more alone time. I tend to do myself in when I think about the stupid and embarrassing things I did in the past. I don't cope very well with disappointment/or when getting yelled at. I get anxious when I'm around a lot of people. A value I fail to put into practice is not caring what others think. I'm afraid to talk about myself (living situation, my life). I wish I didn't feel judged. I wish I didn't over think about situations and the outcome. What others dislike most about me is smoking" (she had to ask her mom about this one). "What I don't seem to handle well is criticism. I don't seem to have the skills I need in order to be a Museum/gallery conservator" (group helped with this one). "A problem that keeps coming back is communication problems/alcoholism. If I could change just one thing in myself, it would be to be more organized, and self assured."

## 2017-02-06 ENCOUNTER — Ambulatory Visit: Payer: Commercial Managed Care - PPO | Admitting: Licensed Professional Counselor

## 2017-02-06 DIAGNOSIS — F1590 Other stimulant use, unspecified, uncomplicated: Secondary | ICD-10-CM

## 2017-02-06 DIAGNOSIS — F1020 Alcohol dependence, uncomplicated: Secondary | ICD-10-CM

## 2017-02-06 NOTE — Progress Notes (Signed)
02/06/17   IOP Group Note: Patient arrived on time to group IOP-F and presented with normal mood and appropriate affect. Patient exhibited no unusual behaviors. Patient was oriented to person, place, time/date and situation. Pt remained in group from 10:30 am to 13:30 pm. Pt was dressed appropriately for group and exhibited good hygiene. Patient denies any SI/HI.  Patient reported cravings level as 1/5.     Pt did participate in group check in and reported she was "all right." She was able to spend some time to herself at her apartment, so she cleaned, watched television. Her boyfriend came home and they ate, watched movies. Then he brought her to her mother's place this morning at 5. She went to the gym, had breakfast, came here.    This process group content consisted of psychoeducation and psychotherapeutic techniques. During today's session, 3 female alumni of [facility] spoke to the group about their personal journey before coming to treatment at [facility], the recovery process, and what life is like now. They each shared their story about the challenges before they got into treatment and also in recovery. Both stressed the importance of meetings, having a sober support network, and coping skills. Each speaker has a daily program of recovery that works for him.Marland Kitchen  Alumni answered questions from the group. Pt participated in a group discussion on tools/activities that he may need to include in his recovery program.  Group then worked on coming up with completing the Naval architect. Patient seemed to benefit from this discussion and question and answers from the speakers.      Progress toward treatment goals for today's session regarding Alumni noted as: Patient listened attentively to the Alumni speakers. Patient's treatment goals as they relate to the topic of this group are: Pt will build a sober support network and consider using some of the coping tools heard about today.    Patient did not report attending any  sober support meetings this past week. Patient did not report having sponsor. Pt wrote on group note: "3 speakers!!!!"    Pt addressed the following treatment plan objectives: Patient made efforts towards learning new ways to live a sober life, reaching recovery goals, and building a sober support network.     Pt's Breathalyzer was negative.

## 2017-02-09 ENCOUNTER — Ambulatory Visit: Payer: Commercial Managed Care - PPO | Admitting: Licensed Professional Counselor

## 2017-02-09 DIAGNOSIS — F1020 Alcohol dependence, uncomplicated: Secondary | ICD-10-CM

## 2017-02-09 DIAGNOSIS — F1590 Other stimulant use, unspecified, uncomplicated: Secondary | ICD-10-CM

## 2017-02-10 NOTE — Progress Notes (Signed)
02/09/17  IOP Group Note: Patient arrived on time to IOP-F group and presented with normal mood and appropriate affect. Patient exhibited no unusual behaviors. Patient was oriented to person, place, time/date and situation. Pt remained in group from 10:30 am-13:30 pm. Pt was dressed appropriately for group and exhibited good hygiene. Patient denied any SI/HI. Patient reported cravings level as 1/5.     Pt did participate in group check-in process and reported that she is doing ok. On Sat she and her boyfriend went to his brother's house to watch television. On Friday they watched movies. The brother's wife is due in January, so pt and the wife talked baby shower things. The brother used a "weed" pen.    This process group content consisted of psychoeducation and psychotherapeutic techniques. This group's topic was focused on [employer] for Thanksgiving. Patient reported what they would be doing on the day. Group was given a worksheet to complete and share. Pt shared that she will most likely spend the day with her boyfriend and his family, while her mother will go with her sister to share the day. Pt denied there would be drinking, and her boyfriend has pretty much stopped drinking since she started IOP. Group also reviewed Relapse Attitudes, The ten Most Common Relapse Dangers, and reviewed that relapse happens before it happens. Pt reported "I tend to overreact to stressful situations" and "I continue to romanticize and glorify previous alcohol experiences" were ones she related to. The most common relapse danger for her was negative feelings, particularly guilt. Patient did appear to benefit from group discussion and activities, as evidenced by the positive feedback and participation during all group activities.    Progress toward treatment goals for today's session regarding Holiday Planning as: Patient listened actively to and participated in the discussion on Holiday Planning. Patient's treatment goals  as they relate to the topic of this group are: Counselor will provide information about having a plan to prevent relapse.     Progress toward treatment goals for today's session regarding early recovery noted as: Patient did not report attending any sober support meetings this past week. Patient did not report having sponsor. Pt group note stated: "Thanksgiving prep."    Pt addressed the following treatment plan objectives: the importance of Holiday Planning to prevent relapse.    Pt's breathalyzer was negative. Pt's UDS was negative.    PACS score= 4.Cravings score consistently low, no additional intervention needed at this time.

## 2017-02-11 ENCOUNTER — Ambulatory Visit: Payer: Commercial Managed Care - PPO | Admitting: Licensed Professional Counselor

## 2017-02-11 ENCOUNTER — Other Ambulatory Visit (HOSPITAL_BASED_OUTPATIENT_CLINIC_OR_DEPARTMENT_OTHER): Payer: Self-pay | Admitting: Psychiatry

## 2017-02-11 ENCOUNTER — Ambulatory Visit (INDEPENDENT_AMBULATORY_CARE_PROVIDER_SITE_OTHER): Payer: Commercial Managed Care - PPO | Admitting: Psychiatry

## 2017-02-11 DIAGNOSIS — F1024 Alcohol dependence with alcohol-induced mood disorder: Secondary | ICD-10-CM

## 2017-02-11 DIAGNOSIS — R6889 Other general symptoms and signs: Secondary | ICD-10-CM

## 2017-02-11 DIAGNOSIS — F1020 Alcohol dependence, uncomplicated: Secondary | ICD-10-CM

## 2017-02-11 DIAGNOSIS — F1590 Other stimulant use, unspecified, uncomplicated: Secondary | ICD-10-CM

## 2017-02-11 DIAGNOSIS — F10282 Alcohol dependence with alcohol-induced sleep disorder: Secondary | ICD-10-CM

## 2017-02-11 MED ORDER — TRAZODONE HCL 150 MG PO TABS
75.0000 mg | ORAL_TABLET | Freq: Every evening | ORAL | 0 refills | Status: AC | PRN
Start: 2017-02-11 — End: 2017-03-13

## 2017-02-11 NOTE — Progress Notes (Signed)
02/11/17  IOP Group Note: Patient arrived on time to group IOP-F and presented with normal mood and appropriate affect. Patient exhibited no unusual behaviors. Patient was oriented to person, place, time/date and situation. Pt remained in group from 10:30 am-13:30 pm. Pt was dressed appropriately for group and exhibited good hygiene. Patient denies any SI/HI. Patient reported cravings level as 1/5.     Pt did participate in group check in and reported that she is doing ok. Yesterday was a "weird day." She went with her mother to [place], then felt dizzy and weird before she entered the store. She wonders if it was a "mild" panic attack, but went into the store, had another episode or two of the same symptoms, then it left her completely. She will be with her boyfriend and his brother's family tomorrow.    This process group content consisted of psychoeducation and psychotherapeutic techniques. This group's topic included Spirituality.  Group reviewed the following questions: What Is Spirituality? How Does One Attain It? What Is the Difference between Religion and Spirituality; and How Does Spirituality Feel? Counselor provided brief psychoeducational instruction about hope, spirituality, and addiction as a spiritual disease. Patient was given the sheet "Chemical dependency is a spiritual disease: spiritual decay/spiritual growth." Pt was asked to identify and share which steps had been taken with the loss of spirituality leading to Spiritual decay; there wasn't time to cover the Spiritual Growth side. Pt shared she had numerous steps to despair, including lies, loss of interests, fear and anxiety, dishonest and selfish behavior and more. Patient was given a list of qualities of "What Spirituality Is" and members shared which items resonated with them. She shared she believes spirituality is being kind to others. Pt reported that she is grateful for the support of others.   Patient did appear to benefit from group  discussion and activities, as evidenced by the positive feedback and participation during all group activities. There was 1  Task presentation today.    Progress toward treatment goals for today's session regarding spirituality noted as: Patient listened actively to the discussion on spirituality and how the loss might happen in active addiction. Progress toward treatment goals for today's session regarding spirituality noted as: Patient listened to the information provided and shared about individual views on spirituality and addiction. Pt will explore the importance of spirituality in recovery.    Patient did not report attending any sober support meetings this past week. Patient did not report having sponsor. Patient wrote on group note: she didn't write anything.    Pt addressed the following treatment plan objectives:  Patient made efforts towards verbalizing what spirituality means to her personally, the difference between spirituality and religion, and how spirituality can benefit recovery.    Pt's breathalyzer was negative.

## 2017-02-11 NOTE — Progress Notes (Signed)
[facility] - AMBULATORY  MD PROGRESS NOTE    Date/Time:  02/11/2017  2:26 PM  Patient Name: Erin, Huber  MRN:  [id number]  Age: 31 y.o.  DOB: 02-27-1986    Vital Signs:     There were no vitals filed for this visit.  Allergies:   No Known Allergies    Chief Complaints:   Patient seen today, came requesting for sleeping medication. Currently, she is in IOP (Half way to graduate)  This is a well known patient to the writer while she was at [facility]  Doing well with Trazodone for sleeping  Denies relapse and no craving and remains sober  Mood is stable and denies depression. .    Current Health Problems:   Patient is a step down from [facility] inpatient unit  She was treated with ativan detox protocol   Mild craving for alcohol this morning  Denies relapse and remains sober until now   Sleeping and appetite have been improving  Mood is stable and denies depression and anxiety   This her first treatment at [facility], never been in any Detox center  As per patient she drinks beers and liquor   She has not taking adderall for more than 3 years  She had been diagnosed with ADHD since grade 3    Review of Systems:   A comprehensive review of systems was negative except for:  Constitutional: positive for fatigue  Eyes: Negative  Ears, nose, mouth, throat, and face: Negative  Respiratory: Negative  Cardiovascular: Negative  Gastrointestinal: Negative  Integument/breast: Negative  Hematologic/lymphatic: Negative  Musculoskeletal: Negative  Neurological: positive for Negative  Behavioral/Psych: positive for anxiety and sleep disturbance  Endocrine: Negative    History of Past Illnesses (Serious illnesses, injuries, surgeries, hospitalizations, including psychiatric admissions):     Past Medical History:   Diagnosis Date   . Anxiety    . Depression        Family Medical History:     Family History   Problem Relation Age of Onset   . Cancer Maternal Grandmother    . Drug abuse Sister        Social History:     History   Alcohol Use   .  Yes     Comment: Drinks 8-10 shots of liquor, 1 bottle of wine and 3-6 hard llemonades daily     History   Drug Use No     History   Smoking Status   . Current Every Day Smoker   . Packs/day: 1.00   . Types: Cigarettes   Smokeless Tobacco   . Never Used     Physical Exam:     General appearance - oriented to person, place, and time   Mental status -  Anxious mood, guarded  Eyes - pupils equal and reactive, extraocular eye movements intact  Ears - bilateral TM's and external ear canals normal  Nose - no septal perforation  Mouth - mucous membranes moist, pharynx normal without lesions  Neck - supple, no significant adenopathy  Chest - clear to auscultation, no wheezes, rales or rhonchi, symmetric air entry  Heart - normal rate, regular rhythm, normal S1, S2, no murmurs, rubs, clicks or gallops  Abdomen - soft, nontender, nondistended, no masses or organomegaly  Pelvic - deferred to PCP  LMP - Patient's last menstrual period was 01/19/2017 (approximate).  Is patient pregnant: No  Rectal - deferred to PCP.  Neurological - alert, oriented, normal speech, no focal findings or movement  disorder noted, screening mental status exam normal, neck supple without rigidity, cranial nerves II through XII intact, motor and sensory grossly normal bilaterally, normal muscle tone, no tremors, strength 5/5  Musculoskeletal - no joint tenderness, deformity or swelling  Extremities - peripheral pulses normal, no pedal edema, no clubbing or cyanosis  Skin - normal     Mental Status Exam:   Appearance: No apparent distress  Behavior/relationship to examiner/demeanor:  Guarded  Motor activity/EPS:  Normal  Gait:   Normal  Mood (subjective report):   anxious  Affect (objective appearance):  Appropriate/mood-congruent  Fund of Knowledge/Intelligence:   Average  Abstraction:   Normal  Insight:   Fair  Judgment:   Fair      Clinical Diagnosis:    Alcohol abuse and dependence   Substance induced mood disorder   Substance induced insomnia      This patient has been informed of their individual treatment plan.  This includes an explanation of the action of all prescribed psychoactive medications.    List of Prescribed Psychoactive Medications (Antidepressants, Mood Stabilizers, Antipsychotics, Antianxiety, Stimulants):   Trazodone   Vistaril     The benefits, potential side effects, risks, and possible drug interactions were explained to this patient who indicated that she understood and agrees to the plan of care.   All patient questions were answered.    Plan:  She is already enrolled to IOP  Continue Trazodone 75 mg at bedtime if insomnia and   Vistaril 25 to 50 mg BID as PRN if anxiety  Substance Abuse Counseling  Discuss with treatment team  Review labs with patient  COUNSEL ON SMOKING CESSATION  OFFER Rx TO STOP SMOKING          Signed by: Micah Flesher, MD   02/11/2017 2:26 PM

## 2017-02-13 ENCOUNTER — Ambulatory Visit: Payer: Commercial Managed Care - PPO

## 2017-02-13 NOTE — Progress Notes (Signed)
02/13/17  This writer LM on pts' phone regarding check-up call.    Azula Zappia, MSW  Supervisee in Social Work  BH Program Therapist I

## 2017-02-16 ENCOUNTER — Ambulatory Visit: Payer: Commercial Managed Care - PPO | Admitting: Licensed Professional Counselor

## 2017-02-16 DIAGNOSIS — F1020 Alcohol dependence, uncomplicated: Secondary | ICD-10-CM

## 2017-02-16 DIAGNOSIS — F1590 Other stimulant use, unspecified, uncomplicated: Secondary | ICD-10-CM

## 2017-02-17 NOTE — Progress Notes (Addendum)
02/16/17  IOP Group Note: Patient arrived on time to group IOP-F and presented with normal mood and appropriate affect. Patient exhibited no unusual behaviors. Patient was oriented to person, place, time/date and situation. Pt remained in group from 10:30 am-13:30 pm. Pt was dressed appropriately for group and exhibited good hygiene. Patient denies any SI/HI. Patient reported cravings level as 1/5.     Pt did participate in group check in and reported that she had a nice Thanksgiving. She and her boyfriend went to his brother's home to eat and celebrate; on Friday she went to her mother's. While her boyfriend played video games with his brother on Sunday, she did her nails. It was a relaxing weekend with family, food, football.      This process group content consisted of psychoeducation and psychotherapeutic techniques. This group's topic concluded Spirituality. Counselor provided brief psychoeducational information about spirituality and addiction as a spiritual disease. Group shared their sheet on the steps to Spiritual Growth. Group reviewed the sheet on "Principles Contained in the Steps," illustrating the Steps, the principle, action, defect, and results in working the Steps. Group also discussed "Some Spiritual Practices for Strengthening Your Recovery" and "Developing Your Spirituality."  Patient did appear to benefit from group discussion and activities, as evidenced by the positive feedback and participation during all group activities. There was one Task presentation. The patient completing IOP today didn't want the Camel Ceremony, so he got his coin and card and group members said good bye individually.     Progress toward treatment goals for today's session regarding spirituality noted as: Patient listened actively to and participated in the discussion on spirituality. Patient's treatment goals as they relate to the topic of this group are: Patient recognized their personal loss of spirituality as their  disease progressed and the importance of gratitude in recovery.     Patient did not report attending any sober support meetings this past week. Patient did not report having sponsor. Patient wrote on group note: "12 Steps."    Pt addressed the following treatment plan objectives: Patient acknowledged how addiction can be a spiritual disease. Patient saw how addiction can lead to a loss of spirituality and how it can be regained by practicing gratitude in recovery.    Pt's breathalyzer was negative. Pt's UDS was negative.    PACS score was 2. Cravings score are consistently low, so no additional intervention needed at this time.

## 2017-02-18 ENCOUNTER — Ambulatory Visit: Payer: Commercial Managed Care - PPO | Admitting: Licensed Professional Counselor

## 2017-02-18 DIAGNOSIS — F1590 Other stimulant use, unspecified, uncomplicated: Secondary | ICD-10-CM

## 2017-02-18 DIAGNOSIS — F1020 Alcohol dependence, uncomplicated: Secondary | ICD-10-CM

## 2017-02-19 NOTE — Progress Notes (Signed)
02/18/17  IOP Group Note: Patient arrived on time to IOP-F group and presented with normal mood and appropriate affect. Patient exhibited no unusual behaviors. Patient was oriented to person, place, time/date and situation. Pt remained in group from 10:30 am to 13:30 pm. Pt was dressed appropriately for group and exhibited good hygiene. Patient denies any SI/HI. Patient reported cravings level as 1/5.    Pt did participate in group check-in process and reported she hasn't been doing too much, but ordered things for her "sister-in-law's" baby shower. She shared she is getting excited; she was even able to go into [facility] as her anxiety is down. She met with the [facility] psychiatrist, and he did not change her medicine at all, though chart review does not reflect that.       This process group content consisted of psychoeducation and psychotherapeutic techniques. This group's topic included Family Dynamics. Patient listened attentively to the information shared on families. During the discussion on adaptive roles family members assume to reduce stress caused by dysfunction in the family, some patients commented on recognizing their family of origin. They were asked for homework to complete a work sheet on identifying which role they believed they used to adapt.  Patient was given handouts from "The Family Trap" by [employer], suggestions for the Recovering Family, System Dynamics of the Alcoholic Family, Characteristics of  healthy, dysfunctional (don't talk, don't trust, don't feel) and chemically addicted families. This allowed patient to follow the discussion, to make comments or ask questions as needed.  Patient did appear to benefit from group discussion and activities, as evidenced by the positive feedback and participation during all group activities. There were two Task presentations today. Group also welcomed a new member.    Progress toward treatment goals for today's session regarding early recovery  noted as: counselor led the discussion on family adaptation roles used to reduce stress of dysfunctional family dynamics.     Patient did not report attending any sober support meetings this past week. Patient did not report having sponsor. Pt group note stated:  She didn't write anything.    Pt addressed the following treatment plan objectives: patient learned the adaptive roles family members use to cope with dysfunction, and that they can change the role with which they identified earlier in life.     Patient's breathalyzer was negative.     Pt completed and presented Task 3 today.

## 2017-02-20 ENCOUNTER — Ambulatory Visit: Payer: Commercial Managed Care - PPO | Admitting: Licensed Professional Counselor

## 2017-02-20 DIAGNOSIS — F1590 Other stimulant use, unspecified, uncomplicated: Secondary | ICD-10-CM

## 2017-02-20 DIAGNOSIS — F1020 Alcohol dependence, uncomplicated: Secondary | ICD-10-CM

## 2017-02-20 NOTE — Progress Notes (Signed)
02/20/17  IOP Group Note: Patient arrived on time to IOP-F group and presented with normal mood and appropriate affect. Patient exhibited no unusual behaviors. Patient was oriented to person, place, time/date and situation. Pt remained in group from 10:30 am to 13:30 pm. Pt was dressed appropriately for group and exhibited good hygiene. Patient denies any SI/HI. Patient reported cravings level as 1/5.    Pt did participate in group check-in process and reported that she is "doing good." On Wed she went with her mother to [employer] again, bought some sweaters, then yesterday went to a craft store and bought a needle point that she already finished.       This process group content consisted of psychoeducation and psychotherapeutic techniques. 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 Patient was given handouts on communication styles: Aggressive, Passive, Passive-Aggressive, and Assertive. Patient was also given information on conflict styles, Effective versus Ineffective Communication, and Direct Versus Indirect Communication. Group reviewed Effective vs Ineffective Communication examples. They were given a brief worksheet to complete on styles used in their lives. She reported that sometimes it's hard to communicate with her boyfriend (doesn't think he's really listening) and her mom (goes on and on, gets dramatic). Patient shared she tends to be passive in her communications. Patient was able to follow the discussion, to make comments or ask questions as needed.  Patient did appear to benefit from group discussion and activities, as evidenced by the positive feedback and participation during all group activities. Group welcomed a new member.     Progress toward treatment goals for today's session regarding communication and conflict styles as: Patient listened actively to the discussion on different types of communication and conflict styles. Patient's treatment goals as they relate to the topic of this  group are: Counselor will provide information how to recognize and change her style of communication to resolve conflicts. Pt will learn ways to improve communication skills when talking with people close to her.    Progress toward treatment goals for today's session regarding early recovery noted as: Patient did not report attending any sober support meetings this past week. Patient did not report having sponsor. Pt group note stated:  "listen, communicate, speak your mind."    Patient learned about different communication and conflict styles, and effective versus ineffective ways to communicate Pt addressed the following treatment plan objectives: patient learned about these styles and how this can benefit recovery.     Patient's breathalyzer was negative.

## 2017-02-23 ENCOUNTER — Ambulatory Visit (HOSPITAL_BASED_OUTPATIENT_CLINIC_OR_DEPARTMENT_OTHER): Payer: Commercial Managed Care - PPO

## 2017-02-23 ENCOUNTER — Ambulatory Visit: Payer: Commercial Managed Care - PPO | Attending: Psychiatry

## 2017-02-23 DIAGNOSIS — F419 Anxiety disorder, unspecified: Secondary | ICD-10-CM | POA: Insufficient documentation

## 2017-02-23 DIAGNOSIS — F10282 Alcohol dependence with alcohol-induced sleep disorder: Secondary | ICD-10-CM

## 2017-02-23 DIAGNOSIS — F17200 Nicotine dependence, unspecified, uncomplicated: Secondary | ICD-10-CM | POA: Insufficient documentation

## 2017-02-23 DIAGNOSIS — F329 Major depressive disorder, single episode, unspecified: Secondary | ICD-10-CM | POA: Insufficient documentation

## 2017-02-23 DIAGNOSIS — F1020 Alcohol dependence, uncomplicated: Secondary | ICD-10-CM | POA: Insufficient documentation

## 2017-02-23 NOTE — Progress Notes (Signed)
IOP Group "F" Note: Patient arrived on time to IOP group and presented with normal mood and appropriate affect. Patient exhibited no unusual behaviors. Patient was oriented to person, place, time/date and situation. Pt remained in group from 10:30 to 13:30. Pt was dressed appropriately for group and exhibited good hygiene. Patient denies any SI/HI. Patient reported cravings level as 1.     Pt did participate in group check-in process and reported that she had a good weekend with her mother and her boyfriend. Pt mentioned that she had a baby shower for her boyfriend's family. Pt stated that she stays all the day with her boyfriend and Sunday they stayed home watching TV. Pt stated that she is working on attending AA meeting.       This process group content consisted of psychoeducation and psychotherapeutic techniques. This group's topic included Boundaries. Patient was given information on Boundaries: An Overview, the difference between rigid, healthy, no boundaries, and partial or inconsistent boundaries. Counselor went over Physical, Sexual, Emotional, and Intellectual Boundaries, what constitutes a violation of each, and which ones they relate to. Pt reported that she is struggling with building relationships with others as she has social anxiety. Pt mentioned that she can listen to others but not talking sharing. Patients were given handouts on "Understanding Your Personal Boundaries."  Patients were asked to color a fence and write things that they want to work on their boundaries. Pt's fence has different colors and she wrote that she needs to work on building healthy relationships with others and learn how to initiate and conversation. Patient did appear to benefit from group discussion and activities, as evidenced by the positive feedback and participation during all group activities.      Progress toward treatment goals for today's session regarding boundaries: Patient listened actively to the discussion on  different types of boundaries and what constitutes a violation of them. Counselor will provide information on healthy boundaries and how to improve the ones they have to benefit/maintain their recovery.    Progress toward treatment goals for today's session regarding early recovery noted as: Patient did report attending 0 sober support meetings this past week. Patient did report that she has no sponsor. Pt group note stated: "painting was fun".    Pt addressed the following treatment plan objectives: patient learned about different types of boundaries, identified her own, and the importance for staying safe in recovery.      Patient's breathalyzer was 0.00. Patient's UDS was N/A.    Johnna Acosta, [place], [facility]  Resident in Counseling

## 2017-02-25 ENCOUNTER — Telehealth (HOSPITAL_BASED_OUTPATIENT_CLINIC_OR_DEPARTMENT_OTHER): Payer: Self-pay

## 2017-02-25 ENCOUNTER — Ambulatory Visit: Payer: Commercial Managed Care - PPO | Admitting: Licensed Professional Counselor

## 2017-02-25 DIAGNOSIS — F1590 Other stimulant use, unspecified, uncomplicated: Secondary | ICD-10-CM

## 2017-02-25 DIAGNOSIS — F1020 Alcohol dependence, uncomplicated: Secondary | ICD-10-CM

## 2017-02-25 NOTE — Telephone Encounter (Signed)
Received voice msg from [facility] specialty pharmacy stating that Vivitrol order had been processed and medication could be shipped. Order request # [id number]. Writer contacted [facility] and arranged delivery for tomorrow 02/26/17. Pt was informed that Clinic RN would call her to set up her next appointment when the medication arrived.

## 2017-02-25 NOTE — Telephone Encounter (Signed)
Writer received information from front desk that pt was asking for an update on her Vivitrol delivery.  Writer contacted pt's specialty pharmacy, [facility] at [phone] to obtain update. Per representative, there is a balance of $500 which will be billed to copay assistance. A 3rd party is handling pt's medical benefits. Representative from [facility] will call clinic back today with an update.

## 2017-02-25 NOTE — Progress Notes (Signed)
02/25/17  IOP Group Note: Patient arrived on time to IOP-F group and presented with normal mood and appropriate affect. Patient exhibited no unusual behaviors. Patient was oriented to person, place, time/date and situation. Pt remained in group from 10:30 am to 13:30 pm. Pt was dressed appropriately for group and exhibited good hygiene. Patient denies any SI/HI.  Patient reported cravings level as 1/5.  Marland Kitchen   Pt did participate in group check-in process and reported that on Monday her boyfriend picked her up from group and they went to his place to watch the [employer] game. Yesterday she returned to the craft store and bought another craft for people 8+. The new member spoke with her later about going to meetings. She doesn't think she has social anxiety, but just hasn't gone. Tomorrow she and her mother are babysitting her nieces.      This process group content consisted of psychoeducation and psychotherapeutic techniques. This group's topic was: What I Value Most in Life. There was brief psychoeducation on what values are, where do they come from, the relationship between personal values and refusal skills. Patient was given 3 pages of values and was asked to choose the 5 most important values. She chose these values: being courageous, being emotionally stable, being a spiritual person, having a financial freedom, being assertive (not take advantage of; being treated fairly.) Patient was asked to identify how addiction interfered with maintaining those values, causing cognitive dissonance, and to explore ways to re-align the values (what are they willing to do or are doing).  Patient did appear to benefit from group discussion and activities, as evidenced by the positive feedback and participation during all group activities.      Progress toward treatment goals for today's session regarding values: Patient listened actively to the discussion on how values are defined and how addiction interferes with ones values,  causing cognitive dissonance. Patient's treatment goals as they relate to the topic of this group are: Counselor will provide information on values, how they can play a part in refusal skills; pts will identify theirs.     Patient did not report attending any sober support meetings this past week. Patient did not report having sponsor. Pt group note stated:  "keep true to your values."    Pt addressed the following treatment plan objectives: patient identified the 5 most important values, recognized how addiction interfered with the practice of those values, and learned ways to re-align those lost values, thus lessening cognitive dissonance.     Patient's breathalyzer was negative.

## 2017-02-26 ENCOUNTER — Encounter (HOSPITAL_BASED_OUTPATIENT_CLINIC_OR_DEPARTMENT_OTHER): Payer: Self-pay

## 2017-02-26 NOTE — Telephone Encounter (Signed)
error 

## 2017-02-27 ENCOUNTER — Ambulatory Visit: Payer: Commercial Managed Care - PPO | Admitting: Licensed Professional Counselor

## 2017-02-27 DIAGNOSIS — F1020 Alcohol dependence, uncomplicated: Secondary | ICD-10-CM

## 2017-02-27 DIAGNOSIS — F10282 Alcohol dependence with alcohol-induced sleep disorder: Secondary | ICD-10-CM

## 2017-02-27 DIAGNOSIS — F1590 Other stimulant use, unspecified, uncomplicated: Secondary | ICD-10-CM

## 2017-02-27 NOTE — Progress Notes (Signed)
12/7//18  IOP Group Note: Patient arrived on time to IOP-F group and presented with normal mood and appropriate affect. Patient exhibited no unusual behaviors. Patient was oriented to person, place, time/date and situation. Pt remained in group from 10:30 am to 13:30 pm. Pt was dressed appropriately for group and exhibited good hygiene. Patient denies any SI/HI. Patient reported craving level as 1/5.Marland Kitchen    Pt did participate in group check-in process and reported "I am doing wonderful." She and her mom babysat her nieces, who then woke up with pink eye. She shared she enjoys them, but not their mother, her sister.    This process group content consisted of psychoeducation and psychotherapeutic techniques. The purpose of this group activity was to help the patient learn Refusal Skills in practical situations and to actually practice saying "no" in a role play. The pt was given reasons for the importance of having refusal skills, and what it could mean for them if they relapsed. Patient learned about direct and indirect social pressure. Patient was given numerous alternative answers to give, if a firm, without hesitation "no" did not work. There was a general discussion about the situations in their lives where people might drink/use or offer them alcohol/drugs.  A list of Refusal Tips was given and read in group. Patients then divided into sets of 2 to role play, one member attempting to entice or talk the other person into using, the other member just being allowed to say "no." There was 1 Task presentation.  There was a Engineer, petroleum for a member completing IOP today. Patient did appear to benefit from group discussion and activities, as evidenced by the positive feedback and participation during all group activities.     Progress toward treatment goals for today's session regarding early recovery noted as: patient listened to the information about Refusal Skills, and the importance of practicing them now so, if in  a real life situation presented itself, it would come naturally to refuse.    Patient did not report attending any sober support meetings this past week. Patient did not report having sponsor.. Pt group note stated: "role modeling/playing."    Patient addressed the following treatment plan objective: Patient learned the importance of knowing about and using refusal skills here and how to apply them in life.    Patient's breathalyzer was negative.

## 2017-03-02 ENCOUNTER — Ambulatory Visit (INDEPENDENT_AMBULATORY_CARE_PROVIDER_SITE_OTHER): Payer: Commercial Managed Care - PPO

## 2017-03-02 ENCOUNTER — Encounter (HOSPITAL_BASED_OUTPATIENT_CLINIC_OR_DEPARTMENT_OTHER): Payer: Self-pay

## 2017-03-02 ENCOUNTER — Ambulatory Visit: Payer: Commercial Managed Care - PPO | Admitting: Licensed Professional Counselor

## 2017-03-02 DIAGNOSIS — F10282 Alcohol dependence with alcohol-induced sleep disorder: Secondary | ICD-10-CM

## 2017-03-02 DIAGNOSIS — F1020 Alcohol dependence, uncomplicated: Secondary | ICD-10-CM

## 2017-03-02 DIAGNOSIS — F1590 Other stimulant use, unspecified, uncomplicated: Secondary | ICD-10-CM

## 2017-03-02 NOTE — Patient Instructions (Signed)
How to Quit Smoking  Smoking is one of the hardest habits to break. About half of allpeople who have ever smoked have been able to quit. Most peoplewho still smoke want to quit. Here are some of the best ways to stop smoking.    Keep trying  It takes most smokers about eight tries before they can quit entirely. It's important not to give up.  Go cold [place]  Mostformer smokers quit cold [place] (all at once). Trying to cut back gradually doesn't seem to work as well, perhaps because it continues the smoking habit. Also, it is possible to inhale more while smoking fewer cigarettes. This results in the same amount of nicotine in your body!  Get support  Support programs can be a big help, especially for heavy smokers. These groups offer lectures, ways to change behavior, and peer support. Here are some ways to find a support program:   Free national quitline: 800-QUIT-NOW 220-145-3463).   Hospital quit-smoking programs.   American Lung Association: (580)627-4674).   American Cancer Society 650-389-8229).  Support at home is important too. Nonsmokers can offer praise and encouragement. If the smoker in your life finds it hard to quit, encourage them to keep trying!  Over-the-counter medicines  Nicotine replacement therapymay make quittingeasier. Certain aids, such as the nicotine patch, gum, and lozenges, are available without a prescription. Itis best to use these under a doctor's care, though. The skin patch provides a steady supply of nicotine. Nicotine gum and lozenges givetemporary bursts of low levels of nicotine. Both methods reduce the craving for cigarettes. Warning: If youhave nausea, vomiting, dizziness, weakness, or a fast heartbeat, stop using these products and see your doctor.  Prescription medicines  After reviewingyour smoking patterns and prior attempts to quit, your doctor may offer a prescription medicine such as bupropion, varenicline, a nicotine inhaler, or nasal spray. Each has  advantages and side effects. Your doctor can review these with you.  Health benefits of quitting  The benefits of quitting start right away and keep improving the longer you go without smoking.These benefits occur at any age. So whether you are 17 or 70, quitting is a good decision. Some of the benefits include:   20 minutes: Blood pressure and pulse return to normal.   8 hours: Oxygen levels return to normal.   2 days: Ability to smell and taste begin to improve as damaged nerves regrow.   2 to 3 weeks: Circulation and lung function improve.   1 to 9 months: Coughing, congestion, and shortness of breath decrease; tiredness decreases.   1 year: Risk of heart attack decreases by half.   5 years: Risk of lung cancer decreases by half; risk of stroke becomes the same as a nonsmoker's.  For more on how to quit smoking, try these online resources:   Smokefree.gov   "Clearing the Air" booklet from the [employer]: FaceUpdate.com.br.pdf  Date Last Reviewed: 07/19/2013   2000-2016 [employer] [address] [place], [place] [address]. All rights reserved. This information is not intended as a substitute for professional medical care. Always follow your healthcare professional's instructions.      I have reviewed the provider's instructions with the patient, answering all questions to her satisfaction.  Always take medication as prescribed.    Call clinic at [phone] with any medication questions or concerns.    Call front desk at [phone] to cancel or reschedule an appointment.    Call 911 or go to nearest emergency  room if feeling unsafe.    Make your next appointment at the conclusion of this visit.

## 2017-03-02 NOTE — Progress Notes (Signed)
Outpatient Medication Management   Non-Psychotropic Injection Progress Note    Name: Erin Huber     [birth year]    Medical Records: [id number]     BP 117/79 (BP Site: Right arm, Patient Position: Sitting)   Pulse 77   Ht 1.626 m (5\' 4" )   Wt 62.2 kg (137 lb 3.2 oz)   LMP 02/14/2017   BMI 23.55 kg/m   No Known Allergies     Physician/Psychiatrist:  Damtew, A.    Does she need to be seen by a doctor today?  No    Toxicology Testing Log 02/25/2017 02/27/2017   Breathalyzer Positive/Negative: Negative Negative   Drug Screen Positive/Negative: - -   Some recent data might be hidden       Results requested by MD? No    Assessment:  Presenting Mental Status  Orientation Level: Oriented X4  Memory: No Impairment  Thought Content: normal  Thought Process: normal  Behavior: normal  Consciousness: Alert  Impulse Control: normal  Perception: normal  Eye Contact: normal  Attitude: cooperative  Mood: normal  Hopelessness Affects Goals: No  Hopelessness About Future: No  Affect: normal  Speech: normal  Concentration: normal  Insight: good  Judgment: good  Appearance: normal  Appetite: normal  Weight change?: normal  Energy: normal  Sleep: normal  Reliability of Reporter/Patient: good    Risk Assessment:     Suicidal Ideation:  None reported/observed    Homicidal Ideation:  None reported/observed    Self-Harm Behavior:  None reported/observed    Current Outpatient Prescriptions   Medication Sig Dispense Refill   . ezetimibe (ZETIA) 10 MG tablet TAKE 1 TABLET BY MOUTH EVERY DAY  2   . hydrOXYzine (VISTARIL) 25 MG capsule Take 25 mg by mouth 2 (two) times daily as needed for Anxiety.     . traZODone (DESYREL) 150 MG tablet Take 0.5 tablets (75 mg total) by mouth nightly as needed for Sleep. 15 tablet 0   . verapamil (VERELAN PM) 120 MG 24 hr capsule Take 120 mg by mouth daily.     Marland Kitchen allopurinol (ZYLOPRIM) 100 MG tablet Take 100 mg by mouth daily.     Marland Kitchen azelastine (OPTIVAR) 0.05 % ophthalmic solution   1   . nicotine (NICODERM  CQ) 21 MG/24HR Place 1 patch onto the skin daily. 28 patch 0      - MAR ACTION REPORT  (last 24 hrs)         Kaida Games, RN       Medication Name Action Time Action Site Route Rate Dose Reason Comments User     alias med E5 injection 380 mg 03/02/17 1415 Given Right Ventrogluteal Intramuscular  380 mg  exp - [id number] Miquel Dunn, RN                Box Lot Number:  [id number]   Box Expiration Date: 30APR2021        Was injection site intact prior to injection administration?  yes  How did she tolerate injection?  Fair. Hcg done prior to injection, negative.             Notes: Patient denies relapse, cravings at 1. Denies suicidal/homicidal ideation, a/v hallucinations. Sleep good, 7-8 hr/night/appetite good. UDS/Breathalyzer negative. States she "always has a little anxiety". States she has not been to any meetings. Denies any withdrawal symptoms. States from prior injection she "has a lump" at the site, LVG. Site assessed, palpated. Lump not visible, but noted with palpation.  No redness or swelling, skin intact. Told pt to continue to monitor site, look for s/s infection (redness, pain, swelling, streaking, puss oozing). Pt verbalized understanding. Pt states she did not massage site after last injection. Will return in 28 days.    No Follow-up on file.    Miquel Dunn, RN

## 2017-03-03 NOTE — Progress Notes (Signed)
03/02/17  IOP Group Note: Patient arrived on time to IOP-F group and presented with normal mood and appropriate affect. Patient exhibited no unusual behaviors. Patient was oriented to person, place, time/date and situation. Pt remained in group from 10:30 am to 13:30 pm. Pt was dressed appropriately for group and exhibited good hygiene. Patient denies any SI/HI. Patient reported cravings level as 1/5.     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 Her boyfriend came over to her place (she lives with her mother in a one bedroom apartment) for dinner. On Sat, he had to work all day to get over time, so she did some craft work. She walked twice to the gas station to get coffee. They just watched football on Sunday. It was a "nice, low key" weekend. She reported she is going in to get labs done, more liver function studies.       This process group content consisted of psychoeducation and psychotherapeutic techniques. This group's topic included Craving Management and tools to manage cravings.  Firstly, patients reviewed the idea of Trigger-Thought-Craving-Use and how "allowing the thoughts to develop into cravings is making a choice to remain dependent on substances."  Counselors discussed Types of Triggers, the different reasons for strengths of triggers, and Dynamics of a Craving. Patients then identified and shared their Internal and External Triggers. She reported that "all" of the internal triggers were ones she related to and her drinking was routine and automatic without much emotional triggering before coming to treatment. She also identified most of the external triggers. "I drank all day, every day."  Group talked about "urge surfing" and other ways to combat cravings from the HBO special on addiction ("Let's Talk About Craving") and 10 other tips for coping with them.  Group learned about how to "Recognize, Reduce, and Refocus."    Patient did appear to benefit from group discussion and activities, as evidenced by the positive feedback and participation during all group activities.      Progress toward treatment goals for today's session regarding Craving Management noted as: Patient listened actively to the discussion on different techniques available to use to manage cravings. Patient's treatment goals as they relate to the topic of this group are: Counselor will provide information about identifying and managing cravings.     Patient did not report attending any sober support meetings this past week. Patient did not report having sponsor. Pt group note stated: "HALTS."       Pt addressed the following treatment plan objectives: Pt identified different tools to manage triggers and cravings before they led to relapse.    Pt's breathalyzer was negative.

## 2017-03-04 ENCOUNTER — Ambulatory Visit: Payer: Commercial Managed Care - PPO | Admitting: Licensed Professional Counselor

## 2017-03-04 DIAGNOSIS — F1020 Alcohol dependence, uncomplicated: Secondary | ICD-10-CM

## 2017-03-04 DIAGNOSIS — F1590 Other stimulant use, unspecified, uncomplicated: Secondary | ICD-10-CM

## 2017-03-05 ENCOUNTER — Encounter: Payer: Self-pay | Admitting: Licensed Professional Counselor

## 2017-03-05 NOTE — Progress Notes (Signed)
Patient Name: Erin Huber                                                Treatment plan review  MRN:     [id number]  Date   Objective  # Disposition Codes Comments   03/01/2017 1 R/A/O Completed all initial paperwork, declined F/F interview,completed Treatment Intake Form, continues to submit BAL and UDS.   03/01/2017 2 R/O Has successfully completed and shared Tasks 1-4; still to do 5.   03-01-2017 3 O Continues to work on Pharmacologist for anxiety, which has lessened with continued sobriety. Completed Self-Care Action Plan.   2017/03/01 4 O Continues to explore facets of spirituality; does not go to church or meditate.   03-01-17 5 O Is on Vivitrol injections.   03-01-2017 6 O Continues to learn about and practices a few early recovery skills.   Mar 01, 2017 7 R/O Continues to identify and manage triggers,cravings, PAWS, RWS, and HRS.   2017-03-01 8 R/A Has been given the Promises of AA, learned about meetings, sponsorship, has yet to attend a meeting.   03-01-2017 9 O Continues to identify potential relapse situations that might happen within the first 90 days.   03-01-17 10 D Discharge plan yet to be determined.     Disposition Codes:   A - Active problem (no progress to date)  AR - Active, but referred out (note referral made)  D - Deferred to a later time (with explanation)  O - Ongoing, with progress  R - Resolved       Pt and counselor met for 15 minutes after group to complete her treatment plan review. Has maintained abstinence, but this is thought to be the result of being on Vivitrol. She does nothing else for her recovery but attend IOP.

## 2017-03-05 NOTE — Progress Notes (Signed)
03/04/17  IOP Group Note: Patient arrived on time to IOP-F group and presented with normal mood and appropriate affect. Patient exhibited no unusual behaviors. Patient was oriented to person, place, time/date and situation. Pt remained in group from 10:30 am to 13:30 pm. Pt was dressed appropriately for group and exhibited good hygiene. Patient denies any SI/HI. Patient reported cravings level as 1/5.     Pt did participate in group check-in process and reported she stayed at her boyfriend's place again. He stayed home from work today, but did bring her to group. She had labs done yesterday for cholesterol and perhaps liver functions, she's really not sure. She went with her mother yesterday when she had her MRI. They had a good lunch, got home early afternoon. She and her boyfriend watched a movie, and she made him eggs for the first time.      This process group content consisted of psychoeducation and psychotherapeutic techniques. This group's topic included "Getting the Most Out of Meetings," "What to Look for in a Sponsor," "The 12 Steps in Plain English," and "Information on Alcoholics Anonymous." Group had discussions on the various sheets given with the information. Patient is one of several who have never been to a meeting. She has said she might go with her mom or boyfriend for support; other members have invited her to come with them. Patient did appear to benefit from group discussion and activities, as evidenced by the positive feedback and participation during all group activities.      Progress toward treatment goals for today's session regarding early recovery noted as: Counselor presented information on AA, other sober support meetings, and sponsorship so she could see if it would benefit her recovery.      Patient did not report attending any sober support meetings this past week. Patient did not report  having sponsor. Pt group note stated: "AA talk."    Pt addressed the following treatment plan  objectives: Patient learned the benefits of sober support through meetings and sponsorship.    Pt's breathalyzer was negative. Pt's UDS was negative.

## 2017-03-06 ENCOUNTER — Ambulatory Visit: Payer: Commercial Managed Care - PPO | Admitting: Licensed Professional Counselor

## 2017-03-06 DIAGNOSIS — F1590 Other stimulant use, unspecified, uncomplicated: Secondary | ICD-10-CM

## 2017-03-06 DIAGNOSIS — F1020 Alcohol dependence, uncomplicated: Secondary | ICD-10-CM

## 2017-03-06 NOTE — Progress Notes (Addendum)
03/06/17  IOP Group Note: Patient arrived on time to group IOP-F and presented with normal mood and appropriate affect. Patient exhibited no unusual behaviors. Patient was oriented to person, place, time/date and situation. Pt remained in group from 10:30 am pm to 13:30 pm. Pt was dressed appropriately for group and exhibited good hygiene. Patient denies any SI/HI. Patient reported cravings level as 1/5.     Pt did participate in group check in and reported nothing has really happened since Wed. On Wed afternoon, she watched 2 movies. Her boyfriend went to work yesterday and today, so today he took her home at 5 am. She thought she would take a nap, but her mom was up, "yakking at me." They ended up going to [employer] at 8 am and no one was there. She is making her father a Christmas card and bought him and his long time girlfriend an inexpensive gift. She shared she got her Vivitrol injection on Monday, 12/10, which led group to a brief discussion about its benefits.    This process group content consisted of psychoeducation and psychotherapeutic techniques.  This group's topic included CBT, managing thoughts.   Patient was given a sheet on "Orientation to Cognitive Therapy," another sheet with examples of the A (activating event), B (belief/thought), C (consequence feelings), and C (consequence behaviors) to see that the same event gives rise to different thoughts that lead to different feelings and behaviors. Patient was given Four Key CBT Questions worksheet and patient reported: she was "in the car with her mom and sister and asked if her boyfriend could come to their Christmas party. Her sister said "no" right away." She felt hurt, even "teared up." She "asked or said "can I ask why?" This is huge for her. "She changed her mind and said maybe, because I am doing so well..We will see." Pt said her sister doesn't really know her boyfriend, and thought this could be a good opportunity. Then this counselor led the  group in a discussion about the topic, and pt did engage in the discussion with the group.  Group reviewed a handout on Cognitive Behavior Overview to look at skills related to changing unhealthy thought patterns: Catch the thoughts, Check the thoughts, and Challenge the thoughts.  There were 2 Task presentations. Patient did appear to benefit from group discussion and activities, as evidenced by the positive feedback and participation during all group activities.      Progress toward treatment goals for today's session regarding CBT noted as: Patient listened actively to the discussion on CBT.  Counselor will provide information about how one can stop or change thoughts which can change feelings and resulting behaviors.     Patient did not report attending any sober support meetings this past week. Patient did not report having sponsor.. Pt wrote on group note: "Cognitive Behavioral Therapy."     Pt acknowledged using unhealthy thinking styles and will learn next session how to identify unhealthy thinking patterns and the coping skills to change thoughts leading to negative feelings and behaviors, which could lead to relapse.    Pt's breathalyzer was negative.

## 2017-03-09 ENCOUNTER — Ambulatory Visit: Payer: Commercial Managed Care - PPO | Admitting: Licensed Professional Counselor

## 2017-03-09 ENCOUNTER — Ambulatory Visit (INDEPENDENT_AMBULATORY_CARE_PROVIDER_SITE_OTHER): Payer: Commercial Managed Care - PPO | Admitting: Psychiatry

## 2017-03-09 DIAGNOSIS — F331 Major depressive disorder, recurrent, moderate: Secondary | ICD-10-CM

## 2017-03-09 DIAGNOSIS — F1590 Other stimulant use, unspecified, uncomplicated: Secondary | ICD-10-CM

## 2017-03-09 DIAGNOSIS — F1020 Alcohol dependence, uncomplicated: Secondary | ICD-10-CM

## 2017-03-09 DIAGNOSIS — F410 Panic disorder [episodic paroxysmal anxiety] without agoraphobia: Secondary | ICD-10-CM

## 2017-03-09 MED ORDER — PAROXETINE HCL 20 MG PO TABS
20.0000 mg | ORAL_TABLET | Freq: Every day | ORAL | 2 refills | Status: AC
Start: 2017-03-09 — End: 2017-04-08

## 2017-03-09 NOTE — Progress Notes (Signed)
03/09/17  IOP Group Note: Patient arrived on time to group IOP-F and presented with normal mood and appropriate affect. Patient exhibited no unusual behaviors. Patient was oriented to person, place, time/date and situation. Pt remained in group from 10:30 am to 13:30 pm. Pt was dressed appropriately for group and exhibited good hygiene. Patient denies any SI/HI. Patient reported cravings level as 1/5.     Pt did participate in group check in and reported that on Friday she donated an old coat to the coat drive downstairs; she and her mom had even washed it and had the zipper fixed. She then got a new coat. She is spending more time with her boyfriend at his place. On Sat, they went to his brother's place to hang out, while she and brother's wife finalized some things about her baby shower. On Sun she watched the game, went to the grocery store. She had to get up at 415 this morning so he could drop her at his mom's before he went to work. Today she sees the [facility] psychiatrist about her anxiety medication; tomorrow she gets follow up with her PCP about her recent labs. (Pt has been encouraged numerous times to go to meetings). Pt shared she might reconsider the Vivitrol shot, as she has 2 lumps from each that she's gotten. She was informed that she could continue with Vivitrol even when she completes IOP.    This process group content consisted of psychoeducation and psychotherapeutic techniques. This group's topic concluded CBT as group went over the sheet on Cognitive Therapy Overview and the schematic of CBT: What you think and do affects the way you feel. Group also went over the Maladaptive Cycle and Addictive Thinking Cycle to see the importance of managing one's thoughts before they can lead to relapse.  Group looked at and discussed Types of Unhealthy Thinking and Unhelpful Thinking Styles and Alternative Responses. Pt reported using "all or nothing thinking."  Group was given two sheets to complete for  homework-Self-Esteem Recognizing the Positives in You and Positive Affirmations. A group member shared his relapse autopsy worksheet, which led to a brief discussion of relapse warning signs, unmanaged stress, and how relapses are planned. Patient did appear to benefit from group discussion and activities, as evidenced by the positive feedback and participation during all group activities.      Progress toward treatment goals for today's session Managing Thoughts/CBT: Patient listened actively to the discussion on Managing Thoughts/CBT. Patient's treatment goals as they relate to the topic of this group are: Counselor will provide information about ways to manage unhealthy thinking and how to cope. Pt will learn new ways to decrease unhealthy thought patterns and increase positive ones.     Patient did not report attending any sober support meetings this past week. Patient did not report having sponsor. Pt wrote on group note: "feelings aren't facts. Think or try to think rationally."     Pt addressed the following treatment plan objectives: Patient made efforts towards learning ways to decrease using negative, unhealthy thought patterns.    Pt's breathalyzer was negative. Pt's UDS was negative.    Pt's PAC=2. Cravings score consistently low, no additional intervention needed at this time.

## 2017-03-09 NOTE — Progress Notes (Signed)
[facility] - AMBULATORY  MD PROGRESS NOTE    Date/Time:  03/09/2017  2:03 PM  Patient Name: Erin Huber, Erin Huber  MRN:  [id number]  Age: 31 y.o.  DOB: 19-Oct-1985    Vital Signs:     There were no vitals filed for this visit.  Allergies:   No Known Allergies    Chief Complaints:   Patient seen today, came to discuss her anxiety which did not improve with Vistaril and requesting stronger medication. She also did not want to take Vistaril as it giving dizziness whenever she takes it. Currently, she is in IOP (Half way to graduate). Patient claims mild numbness after vivtrol injection (right lower extremity, denies weakness and shooting pain.   This is a well known patient to the writer while she was at   Doing well with Trazodone for sleeping  Denies relapse and no craving and remains sober  Reports having and feeling low mood (depression) denies mood swings or s/h ideations. .    Current Health Problems:   Patient is a step down from [facility] inpatient unit  She was treated with ativan detox protocol   Mild craving for alcohol this morning  Denies relapse and remains sober until now   Sleeping and appetite have been improving  Mood is stable and denies depression and anxiety   This her first treatment at [facility], never been in any Detox center  As per patient she drinks beers and liquor   She has not taking adderall for more than 3 years  She had been diagnosed with ADHD since grade 3    Review of Systems:   A comprehensive review of systems was negative except for:  Constitutional: positive for fatigue  Eyes: Negative  Ears, nose, mouth, throat, and face: Negative  Respiratory: Negative  Cardiovascular: Negative  Gastrointestinal: Negative  Integument/breast: Negative  Hematologic/lymphatic: Negative  Musculoskeletal: Negative  Neurological: positive for Negative  Behavioral/Psych: positive for anxiety and sleep disturbance  Endocrine: Negative    History of Past Illnesses (Serious illnesses, injuries, surgeries, hospitalizations,  including psychiatric admissions):     Past Medical History:   Diagnosis Date   . Anxiety    . Depression        Family Medical History:     Family History   Problem Relation Age of Onset   . Cancer Maternal Grandmother    . Drug abuse Sister        Social History:     History   Alcohol Use   . Yes     Comment: Drinks 8-10 shots of liquor, 1 bottle of wine and 3-6 hard llemonades daily     History   Drug Use No     History   Smoking Status   . Current Every Day Smoker   . Packs/day: 1.00   . Types: Cigarettes   Smokeless Tobacco   . Never Used     Physical Exam:     General appearance - oriented to person, place, and time   Mental status -  Anxious mood, guarded  Eyes - pupils equal and reactive, extraocular eye movements intact  Ears - bilateral TM's and external ear canals normal  Nose - no septal perforation  Mouth - mucous membranes moist, pharynx normal without lesions  Neck - supple, no significant adenopathy  Chest - clear to auscultation, no wheezes, rales or rhonchi, symmetric air entry  Heart - normal rate, regular rhythm, normal S1, S2, no murmurs, rubs, clicks or gallops  Abdomen - soft, nontender, nondistended, no masses or organomegaly  Pelvic - deferred to PCP  LMP - Patient's last menstrual period was 02/14/2017.  Is patient pregnant: No  Rectal - deferred to PCP.  Neurological - alert, oriented, normal speech, no focal findings or movement disorder noted, screening mental status exam normal, neck supple without rigidity, cranial nerves II through XII intact, motor and sensory grossly normal bilaterally, normal muscle tone, no tremors, strength 5/5  Musculoskeletal - no joint tenderness, deformity or swelling  Extremities - peripheral pulses normal, no pedal edema, no clubbing or cyanosis  Skin - normal     Mental Status Exam:   Appearance: No apparent distress  Behavior/relationship to examiner/demeanor:  Guarded  Motor activity/EPS:  Normal  Gait:   Normal  Mood (subjective report):    anxious  Affect (objective appearance):  Appropriate/mood-congruent  Fund of Knowledge/Intelligence:   Average  Abstraction:   Normal  Insight:   Fair  Judgment:   Fair      Clinical Diagnosis:    Alcohol abuse and dependence   Substance induced mood disorder   Substance induced insomnia     This patient has been informed of their individual treatment plan.  This includes an explanation of the action of all prescribed psychoactive medications.    List of Prescribed Psychoactive Medications (Antidepressants, Mood Stabilizers, Antipsychotics, Antianxiety, Stimulants):   Trazodone   Vistaril     The benefits, potential side effects, risks, and possible drug interactions were explained to this patient who indicated that she understood and agrees to the plan of care.   All patient questions were answered.    Plan:  She is already enrolled to IOP  Start Paxil 20 mg daily  She is given script for one month with 2 refills  Discussed the risk and benefit of medication, she agrees and consented for medication   Continue Trazodone 75 mg at bedtime if insomnia and   Follow up with in a month  Discontinue Vistaril   Substance Abuse Counseling  Discuss with treatment team  Review labs with patient  COUNSEL ON SMOKING CESSATION  OFFER Rx TO STOP SMOKING          Signed by: Micah Flesher, MD   03/09/2017 2:03 PM

## 2017-03-11 ENCOUNTER — Ambulatory Visit: Payer: Commercial Managed Care - PPO | Admitting: Licensed Professional Counselor

## 2017-03-11 DIAGNOSIS — F1590 Other stimulant use, unspecified, uncomplicated: Secondary | ICD-10-CM

## 2017-03-11 DIAGNOSIS — F1020 Alcohol dependence, uncomplicated: Secondary | ICD-10-CM

## 2017-03-12 NOTE — Progress Notes (Signed)
Erin Huber  03/11/17  IOP Group Note:   Patient arrived on time to group IOP-F and presented with normal mood and appropriate affect. Patient exhibited no unusual behaviors. Patient was oriented to person, place, time/date and situation. Pt remained in group from 10:30 am to 13:30 pm. Pt was dressed appropriately for group and exhibited good hygiene.  Patient denies any SI/HI.  Pt stated his craving level was 1/5.  Pt did participate in group check in and reported that she is off Vistaril and got Paxil but has not started it. Patient shared that she saw her primary care doctor, and was told that her liver function is still a little high. Patient shared that when she got her Vivatrol shot they hit a nerve and therefore she has a bruised bump. Patient shared that she is back on gout medication.  This process group content consisted of psychoeducation and psychotherapeutic techniques. This group's topic included Post -Acute Withdrawal Symptoms (PAWS). Group was given a packet of information that covers the causes, the major symptoms, the approximate time line for symptoms, and skills for managing PAWS and stress. Group was given a sheet with symptoms of hypoglycemia to review. Patient was given the sheet "Symptoms Frequently Experienced by Recovering Alcoholics/Addicts" to review and discuss.  Patient did appear to benefit from group discussion and activities, as evidenced by the positive feedback and participation during all group activities.  The group had a new member join and a former member leaving. The group members were given a folded piece of paper with a question on it. Each group member answered the question they had on their slip. This was a form of icebreaker to help the group members get to know each other. Each group member said farewell to the patient who was being discharged today.   Progress toward treatment goals for today's session regarding P.A.W.S. noted as: Patient listened actively to the  discussion on PAWS. Patient's treatment goals as they relate to the topic of this group are: Counselor will provide information about PAWS and coping skills to manage them and stress.  Patient did not report attending any sober support meetings this past week.  Patient did notreport having a sponsor .   Pt group note stated: "No hydroxyzine, added Paxil (haven't taken it yet), added Appuronol again. PAWS up to 3 years".  Pt addressed the following treatment plan objectives: Patient made efforts towards learning to identify PAWS and manage these symptoms and signs of stress to prevent relapse.  Pt's breathalyzer was negative.    Reviewed and signed by Rolla Plate, MSW, CSAC

## 2017-03-13 ENCOUNTER — Ambulatory Visit: Payer: Commercial Managed Care - PPO

## 2017-03-13 ENCOUNTER — Encounter: Payer: Self-pay | Admitting: Licensed Professional Counselor

## 2017-03-13 NOTE — Progress Notes (Signed)
Pt called early in the day to say for sure that she wasn't coming to group today. She was uncertain if she would be here on 03/16/17. This is an excused absence, as she had talked about it for a week.

## 2017-03-16 ENCOUNTER — Ambulatory Visit: Payer: Commercial Managed Care - PPO

## 2017-03-16 ENCOUNTER — Encounter (HOSPITAL_BASED_OUTPATIENT_CLINIC_OR_DEPARTMENT_OTHER): Payer: Self-pay

## 2017-03-16 NOTE — Progress Notes (Signed)
Patient did not attend IOP-F on 12/24 and notified [facility] prior to absence.  Erin Huber  Supervisee in Social Work

## 2017-03-18 ENCOUNTER — Ambulatory Visit: Payer: Commercial Managed Care - PPO

## 2017-03-18 DIAGNOSIS — F1590 Other stimulant use, unspecified, uncomplicated: Secondary | ICD-10-CM

## 2017-03-18 DIAGNOSIS — F1020 Alcohol dependence, uncomplicated: Secondary | ICD-10-CM

## 2017-03-18 NOTE — Progress Notes (Signed)
IOP-F Group Note:  Patient arrived on time to group on 03/18/17 and presented with normal mood and normal affect. Patient exhibited no unusual behaviors.  Patient was oriented to person, place, time/date and situation.  Patient remained in group from 1030 to 1330.  Patient was dressed appropriately for group and exhibited good hygiene. Patient denies any SI/HI.  Patient reported cravings level as "1/5".     Pt participated in check-in and reported that she was absent on Monday because she thought she could take a day off. Pt shared that she spent all day Friday with her mother and Saturday with her boyfriend. Pt shared they both went together for a christmas party. Pt shared she visited the festival of lights with her family during the holidays. Pt reported that she is not going to meetings yet but knows that she should. Group also discussed the significance of going to meetings, major values in AA group and how they can keep one sober and the power of offering a non-judgmental environment to a recovering addict.     This process group content consisted of psychoeducation and psychotherapeutic techniques. This group's topic included Emotions. This Clinical research associate reviewed the following topics with the group: Functions of emotions, myths regarding emotions, DBT skills to manage and regulate emotions, being mindful of what interpretation we give to emotions, primary and secondary emotions, accepting emotions and sometimes letting go of painful emotions, importance of eating healthy, sleep and exercise for emotional health, distress tolerance, using the wise mind, increasing positive emotions in the long run, and managing anger in recovery. Patient did appear to benefit from group discussion, as evidenced by active participation in group discussions and sharing personal experiences with group members. In addition group watched video by Dewain Penning- Listening to Shame.     Progress toward treatment goals for today's session  regarding early recovery noted as:  Patient participated in the discussion/activities that appeared to enhance the group's understanding of the benefits of meditation. Patient reported attending 0 sober support meetings this past week. Patient reported having 0 sponsor contact this week.     Patient wrote note on her  group note "shame isn't fear. Dewain Penning ted talk".    Pt addressed the following treatment plan objectives: Understanding how to manage emotions and evaluate if some emotions act as potential triggers.  Reported Sobriety Date: 9/05/01/2016    UDS: Negative                                                                                Breathalyzer: 0.000        Salley Hews, M.A., M.Ed.   Behavioral Health Therapist I

## 2017-03-20 ENCOUNTER — Ambulatory Visit: Payer: Commercial Managed Care - PPO

## 2017-03-20 DIAGNOSIS — F1020 Alcohol dependence, uncomplicated: Secondary | ICD-10-CM

## 2017-03-20 NOTE — Progress Notes (Signed)
IOP-F Group Note:  Patient arrived on time to group on 03/20/17 and presented with normal mood and normal affect. Patient exhibited no unusual behaviors.  Patient was oriented to person, place, time/date and situation.  Patient remained in group from 1030 to 1330.  Patient was dressed appropriately for group and exhibited good hygiene. Patient denies any SI/HI.  Patient reported cravings level as "1/5".     Pt participated in check-in and reported she has been referred to a nurse specialist, a nurse practitioner, but she did not specify why. She noted 3 months sobriety today and received a clapping ovation from the group in support. She reported being in "good spirits", having a "positive outlook".    This process group content consisted of psychoeducation and psychotherapeutic techniques. This group's topic included the Physiological effects of drug and alcohol use. Group was given a packet of information that covers the physiological effects, organs affected, and disease progression of drug (opioid, marijuana) and alcohol use. Pt's were later presented a social mindfulness technique called "reflective listening", divided into pairs and asked to take turns discussing the health effects of their drug or alcohol use while their partner summarized their comments using reflective listening at regular intervals.  The group noted the power of offering a non-judgmental environment to a recovering addict, a theme building on the content offered in the previous group session.     Patient did appear to benefit from group discussion and activities, as evidenced by the positive feedback and participation during all group activities.        Progress toward treatment goals for today's session regarding group content noted as: Patient listened actively to the discussion on presented material, and actively participated in partner exercise.     Patient report attending 0 sober support meetings this past week.  Patient report having  a sponsor and report calling sponsor 0 times.     Pt group note stated: "Being healthy and watching what you put in your body. Reflective Listening"     Pt addressed the following treatment plan objectives: Patient made efforts towards learning about the physical effects of drug and alcohol use.      BAL: N/A  UDS: N/A     PACS administered, score 2.  Cravings score consistently low.  No additional intervention needed at this time.     Eunice Blase, MA Therapist in Residence

## 2017-03-23 ENCOUNTER — Ambulatory Visit: Payer: Commercial Managed Care - PPO

## 2017-03-23 DIAGNOSIS — F1020 Alcohol dependence, uncomplicated: Secondary | ICD-10-CM

## 2017-03-23 NOTE — Progress Notes (Signed)
IOP-F Group Note:  Patient arrived on time to group on 03/23/17 and presented with normal mood and normal affect. Patient exhibited no unusual behaviors.  Patient was oriented to person, place, time/date and situation.  Patient remained in group from 1030 to 1330.  Patient was dressed appropriately for group and exhibited good hygiene. Patient denies any SI/HI.  Patient reported cravings level as "1/5".     Pt participated in check-in and reported she had a minor argument with her boyfriend this morning, but that they are still in a good place in their relationship.  She reported feeling excited about completing and graduating the [facility] program, and she was invited to attend the next upcoming [facility] graduation ceremony in January 2019.     This process group content consisted of psychoeducation and psychotherapeutic techniques. This group's topic included the Stages of Change in drug and alcohol use, and the Hero's Journey, as it relates to personal recovery and change. Group was given a packet of information that covers these topics as well as didactic instruction and personal sharing.      Patient did appear to benefit from group discussion and activities, as evidenced by the positive feedback and participation during all group activities.        Progress toward treatment goals for today's session regarding group content noted as: Patient listened actively to the discussion on presented material.     Patient report attending 0 sober support meetings this past week.  Patient report having a sponsor and report calling sponsor 0 times.   Pt group note stated: "Happy Last Day!"     Pt addressed the following treatment plan objectives: Patient made efforts towards learning about the stages of change in recovery from alcohol use.      BAL: N/A UDS: N/A     PACS administered, score 2.  Discussed fluctuating cravings scale and checked in regarding sober network and relapse plan.     Eunice Blase, MA Therapist in Residence

## 2017-03-25 ENCOUNTER — Ambulatory Visit: Payer: Commercial Managed Care - PPO

## 2017-03-26 ENCOUNTER — Encounter: Payer: Self-pay | Admitting: Licensed Professional Counselor

## 2017-03-26 NOTE — Progress Notes (Signed)
Late Entry    Discharge from IOP: Patient was discharged from IOP-F on 03/23/17 due to no longer having insurance as of 03/24/17. Patient completed 29 of the 30 recommended groups in IOP between the dates of 01/07/17 and 03/23/17.  Patient was not present to participate in the discharge process. She attended last session on 03/23/17, but covering worker was unaware of the discharge process.    After Care: Patient reports plans for after-care include obtaining a new job. Patient was recommended to begin attending AA meetings, which she never did throughout IOP. She plans to discuss with her PCP getting back on Revvia, since she is without insurance and had negative physical reactions to the Vivitrol injection twice.  Patient was provided the following referrals: looking into applying for Medicaid since she no longer has insurance and has not worked in 4 months.   Appointments Made: none made at this time  Home Group: she did not attend meetings     UDS: Patient was last administered a UDS on 03/08/17 and was negative.     Breathalyzer: Patient was last administered a breathalyzer on 03/18/17 and was negative.     TASR: Patient was given the TASR on admission only, 01/07/17. On the TASR, patient was a low level of suicide risk with routine monitoring/suicide level alert with the following protective factors of abstinent from substances (she also has a boyfriend she's been with for 2 years). Patient has developed a safety plan, has a copy, and reports that it continues to meet her needs. Patient denied SI/HI at this time.     Refer to discharge summary and final behavioral treatment plan review for details.

## 2017-03-27 ENCOUNTER — Ambulatory Visit: Payer: Commercial Managed Care - PPO

## 2017-03-30 ENCOUNTER — Ambulatory Visit: Payer: Commercial Managed Care - PPO

## 2017-04-01 ENCOUNTER — Ambulatory Visit: Payer: Commercial Managed Care - PPO

## 2017-04-03 ENCOUNTER — Ambulatory Visit: Payer: Commercial Managed Care - PPO

## 2017-04-06 ENCOUNTER — Ambulatory Visit: Payer: Commercial Managed Care - PPO

## 2017-04-08 ENCOUNTER — Ambulatory Visit: Payer: Commercial Managed Care - PPO

## 2017-04-19 ENCOUNTER — Encounter (HOSPITAL_BASED_OUTPATIENT_CLINIC_OR_DEPARTMENT_OTHER): Payer: Self-pay | Admitting: Psychiatry

## 2017-04-19 NOTE — Progress Notes (Signed)
I, Dr Suanne Marker, hereby attest that this patient's participation in treatment with the [facility] IOP program has been reviewed by the assigned treatment team, and patient will be discharged from [facility] ambulatory program at this time.  I, Dr Suanne Marker, also attest that the patient receiving treatment, Erin Huber, has been made aware of her discharge.

## 2017-05-07 ENCOUNTER — Ambulatory Visit (INDEPENDENT_AMBULATORY_CARE_PROVIDER_SITE_OTHER): Payer: Medicaid Other | Admitting: Internal Medicine

## 2017-05-07 ENCOUNTER — Encounter (INDEPENDENT_AMBULATORY_CARE_PROVIDER_SITE_OTHER): Payer: Self-pay | Admitting: Internal Medicine

## 2017-05-07 VITALS — BP 124/83 | HR 81 | Temp 98.8°F | Ht 64.0 in | Wt 135.4 lb

## 2017-05-07 DIAGNOSIS — E785 Hyperlipidemia, unspecified: Secondary | ICD-10-CM | POA: Insufficient documentation

## 2017-05-07 DIAGNOSIS — F10282 Alcohol dependence with alcohol-induced sleep disorder: Secondary | ICD-10-CM

## 2017-05-07 DIAGNOSIS — M1A9XX0 Chronic gout, unspecified, without tophus (tophi): Secondary | ICD-10-CM | POA: Insufficient documentation

## 2017-05-07 DIAGNOSIS — R42 Dizziness and giddiness: Secondary | ICD-10-CM

## 2017-05-07 DIAGNOSIS — F419 Anxiety disorder, unspecified: Secondary | ICD-10-CM

## 2017-05-07 MED ORDER — EZETIMIBE 10 MG PO TABS
10.0000 mg | ORAL_TABLET | Freq: Every day | ORAL | 0 refills | Status: DC
Start: 2017-05-07 — End: 2018-09-29

## 2017-05-07 MED ORDER — PAROXETINE HCL 20 MG PO TABS
20.0000 mg | ORAL_TABLET | Freq: Every morning | ORAL | 0 refills | Status: DC
Start: 2017-05-07 — End: 2017-08-31

## 2017-05-07 MED ORDER — VERAPAMIL HCL ER 120 MG PO CP24
120.0000 mg | ORAL_CAPSULE | Freq: Every day | ORAL | 0 refills | Status: DC
Start: 2017-05-07 — End: 2017-08-31

## 2017-05-07 NOTE — Progress Notes (Signed)
Subjective:      Patient ID: Erin Huber is a 32 y.o. female  Chief Complaint   Patient presents with   . Depression     new patient        HPI    Problem   Anxiety    Takes paxil for anxiety. Has been on that for a month. Was an outpatient program, [facility]. At first tried hydroxyzine, but made her feel weird. The paxil has been helping.      Dizziness    Takes verapamil for years. At first was prescribed by ENT. Helps with dizziness to feel normal. Was told it was an inner ear problem but didn't give a diagnosis such as vertigo.      Chronic Gout Without Tophus, Unspecified Cause, Unspecified Site    Had two episodes of gout and had high uric acid thought to be due to the drinking. Was on allopurinol. Then took her off and uric acid was a little high so put her back on. Would rather see how she does off of it.      Hyperlipidemia, Unspecified Hyperlipidemia Type    High cholesterol. Started on zetia bc she was drinking at the time. Was planning to recheck lipids in March.      Lab Results   Component Value Date    CHOL 236 (H) 12/22/2016     Lab Results   Component Value Date    HDL 17 (L) 12/22/2016     Lab Results   Component Value Date    LDL 187 (H) 12/22/2016     Lab Results   Component Value Date    TRIG 158 (H) 12/22/2016            The following portions of the patient's history were reviewed and updated as appropriate: current medications, allergies, past family history, past medical history, past social history, past surgical history and problem list.     Review of Systems   Constitutional: Negative.    Respiratory: Negative.    Cardiovascular: Negative.          BP 124/83   Pulse 81   Temp 98.8 F (37.1 C) (Oral)   Ht 1.626 m (5\' 4" )   Wt 61.4 kg (135 lb 6.4 oz)   SpO2 98%   BMI 23.24 kg/m    Objective:   Physical Exam   Constitutional: She is oriented to person, place, and time. She appears well-developed and well-nourished. No distress.   Eyes: Conjunctivae are normal.   Cardiovascular:  Normal rate, regular rhythm and normal heart sounds.    Pulmonary/Chest: Effort normal and breath sounds normal.   Neurological: She is alert and oriented to person, place, and time.   Psychiatric: She has a normal mood and affect.   Nursing note and vitals reviewed.    PHQ-9: 0.5  Assessment:     1. Anxiety  CBC without differential    Comprehensive metabolic panel    Lipid panel    PARoxetine (PAXIL) 20 MG tablet    Ambulatory referral to Behavioral Health   2. Dizziness  verapamil (VERELAN PM) 120 MG 24 hr capsule   3. Chronic gout without tophus, unspecified cause, unspecified site  Uric acid   4. Hyperlipidemia, unspecified hyperlipidemia type  CBC without differential    Comprehensive metabolic panel    Lipid panel    ezetimibe (ZETIA) 10 MG tablet   5. Alcohol dependence with alcohol-induced Huber disorder  Amb[facility] referral to [facility]  Plan:     32 y.o. female presents to establish care.     1. Anxiety: improved  -continue paxil  -consider counseling  -care plan letter at next visit    2. Alcohol dependence in remission: sober for several months  -congratulated pt and encouraged continued sobriety  -counseled on considering an ongoing program such as AA or counseling to have a place to go if she feels more stressed or like she may turn to alcohol. She may be good now, but in the future there could be a time where she may want to turn to alcohol and to have support/mentor/sponsor/counselor to be able to reach out at that time is very important. She voiced understanding.     3. Dizziness: unclear underlying cause, could be vertigo  -pt reports improvement with verapamil, will continue for now    4. Gout: 2 episodes in the past when drinking  -since now sober, will do trial off of allopurinol to see if daily treatment is warranted  -check uric acid now and again in the future for trend.     5. Hyperlipidemia; on zetia  -check lipids  -can consider trial off of it in future if needed. Since  LFTs improved, could consider statin in the future if treatment is warranted    6. Abnormal LFTs: likely due to drinking in the past  -continue to monitor    Return in about 4 weeks (around 06/04/2017) for follow up current symptoms.    I spent >50% of a 30 min visit on counseling and management of the above.    Gust Brooms, MD

## 2017-05-07 NOTE — Patient Instructions (Signed)
Depression Affects Your Mind and Body    Everyone feels sad or "blue" from time to time for a few days or weeks.Depression is when these feelings don't go away and they interfere with daily life. Depression is a real illness that can develop at any age. It is one of the most common mental health problems in the [place] Depression makes you feel sad, helpless, and hopeless. It gets in the way of your life and relationships. It inhibits your ability to think and act. But, with help, you can feel better again.  Depression affects your whole body  Brain chemicals affect your body as well as your mood. So depression may do more than just make you feel low. You may also feel bad physically. Depression can:   Cause trouble with mental tasks such as remembering, concentrating, or making decisions   Make you feel nervous and jumpy   Cause trouble sleeping. Or you may sleep too much   Change your appetite   Cause headaches, stomachaches, or other aches and pains   Drain your body of energy  Depression and other illness  It is common for people who have chronic health problems to also have depression. It can often be hard to tell which one caused the other. A person might become depressed after finding out they have a health problem. But some studies suggest being depressed may make certain health problems more likely. And some depressed people stop taking care of themselves. This may make them more likely to get sick.  Date Last Reviewed: 03/25/2015   2000-2018 [employer], [place]. [address] [place], [place] [address]. All rights reserved. This information is not intended as a substitute for professional medical care. Always follow your healthcare professional's instructions.        Depression  Depression is one of the most common mental health problems today. It is not just a state of unhappiness or sadness. It is a true disease. The cause seems to be related to a decrease in chemicals that transmit signals  in the brain. Having a family history of depression, alcoholism, or suicide increases the risk. Chronic illness, chronic pain, migraine headaches and high emotional stress also increase the risk.  Depression is something we tend to recognize in others, but may have a hard time seeing in ourselves. It can show in many physical and emotional ways:   Loss of appetite   Over-eating   Not being able to sleep   Sleeping too much   Tiredness not related to physical exertion   Restlessness or irritability   Slowness of movement or speech   Feeling depressed or withdrawn   Loss of interest in things you once enjoyed   Troubleconcentrating, poor memory,trouble making decisions   Thoughts of harming or killing oneself, or thoughts that life is not worth living   Low self-esteem  The treatment for depression may include both medicine and psychotherapy. Antidepressants can reduce suffering and can improve the ability to function during the depressed period. Therapy can offer emotional support and help you understand emotional factors that may be causing the depression.  Home care   On-going care and support helps people manage this disease. Find a healthcare provider and therapist who meet your needs. Seek help when you feel like you may be getting ill.   Be kind to yourself. Make it a point to do things that you enjoy (gardening, walking in nature, going to a movie, etc.). Reward yourself for small successes.  Take care of your physical body. Eat a balanced diet (low in saturated fat and high in fruits and vegetables). Exercise at least 3 times a week for 30 minutes. Even mild-moderate exercise (like brisk walking) can make you feel better.   Avoid alcohol, which can make depression worse.   Take medicine as prescribed.   Tell each of your healthcare providers about all of the prescription drugs, over-the-counter medicines, vitamins, and supplements you take.Certain supplements interact with medicines and  can result in dangerous side effects.Ask your pharmacist when you have questions about drug interactions.   Talk with your family andtrusted friendsabout your feelings and thoughts.Ask them to help you recognize behavior changes early so you can get help and, if needed, medicine can be adjusted.  Follow-up care  Follow up with your healthcare provider, or as advised.  Call 911  Call 911 if you:   Have suicidal thoughts, a suicide plan, and the means to carry out the plan   Have trouble breathing   Arevery confused   Feel very drowsy or havetrouble awakening   Faint or lose consciousness   Have new chest pain that becomes more severe, lasts longer, or spreads into your shoulder, arm, neck, jaw or back  When to seek medical advice  Call your healthcare provider right away if any of these occur:   Feeling extreme depression, fear, anxiety, or anger toward yourself or others   Feeling out of control   Feeling that you may try to harm yourself or another   Hearing voices that others do not hear   Seeing things that others do not see   Can't sleep or eat for 3 days in a row   Friends or family express concern over your behavior and ask you to seek help  Date Last Reviewed: 12/20/2013   2000-2016 [employer] [address], [place], [place] [address]. All rights reserved. This information is not intended as a substitute for professional medical care. Always follow your healthcare professional's instructions.

## 2017-05-07 NOTE — Progress Notes (Signed)
Have you seen any specialists/other providers since your last visit with us?    Yes    Arm preference verified?   Yes    The patient is due for depression screening

## 2017-05-11 ENCOUNTER — Encounter (INDEPENDENT_AMBULATORY_CARE_PROVIDER_SITE_OTHER): Payer: Self-pay | Admitting: Internal Medicine

## 2017-05-18 ENCOUNTER — Ambulatory Visit (INDEPENDENT_AMBULATORY_CARE_PROVIDER_SITE_OTHER): Payer: Self-pay | Admitting: Internal Medicine

## 2017-06-04 ENCOUNTER — Encounter (INDEPENDENT_AMBULATORY_CARE_PROVIDER_SITE_OTHER): Payer: Self-pay | Admitting: Internal Medicine

## 2017-06-04 DIAGNOSIS — M1A9XX0 Chronic gout, unspecified, without tophus (tophi): Secondary | ICD-10-CM

## 2017-06-04 DIAGNOSIS — F419 Anxiety disorder, unspecified: Secondary | ICD-10-CM

## 2017-06-04 DIAGNOSIS — E785 Hyperlipidemia, unspecified: Secondary | ICD-10-CM

## 2017-06-08 ENCOUNTER — Other Ambulatory Visit: Payer: Self-pay

## 2017-06-09 ENCOUNTER — Encounter: Payer: Self-pay | Admitting: Licensed Professional Counselor

## 2017-06-09 LAB — CBC
Hematocrit: 40 % (ref 34.0–46.6)
Hemoglobin: 13.6 g/dL (ref 11.1–15.9)
MCH: 32.5 pg (ref 26.6–33.0)
MCHC: 34 g/dL (ref 31.5–35.7)
MCV: 96 fL (ref 79–97)
Platelets: 166 10*3/uL (ref 150–379)
RBC: 4.19 x10E6/uL (ref 3.77–5.28)
RDW: 15.3 % (ref 12.3–15.4)
WBC: 7 10*3/uL (ref 3.4–10.8)

## 2017-06-09 LAB — COMPREHENSIVE METABOLIC PANEL
ALT: 19 IU/L (ref 0–32)
AST (SGOT): 26 IU/L (ref 0–40)
Albumin/Globulin Ratio: 1.4 (ref 1.2–2.2)
Albumin: 4.4 g/dL (ref 3.5–5.5)
Alkaline Phosphatase: 64 IU/L (ref 39–117)
BUN / Creatinine Ratio: 13 (ref 9–23)
BUN: 9 mg/dL (ref 6–20)
Bilirubin, Total: 0.2 mg/dL (ref 0.0–1.2)
CO2: 23 mmol/L (ref 20–29)
Calcium: 9.8 mg/dL (ref 8.7–10.2)
Chloride: 100 mmol/L (ref 96–106)
Creatinine: 0.69 mg/dL (ref 0.57–1.00)
EGFR: 115 mL/min/{1.73_m2} (ref >59)
EGFR: 132 mL/min/{1.73_m2} (ref >59)
Globulin, Total: 3.1 g/dL (ref 1.5–4.5)
Glucose: 99 mg/dL (ref 65–99)
Potassium: 3.9 mmol/L (ref 3.5–5.2)
Protein, Total: 7.5 g/dL (ref 6.0–8.5)
Sodium: 139 mmol/L (ref 134–144)

## 2017-06-09 LAB — LIPID PANEL
Cholesterol / HDL Ratio: 2.1 ratio (ref 0.0–4.4)
Cholesterol: 221 mg/dL — ABNORMAL HIGH (ref 100–199)
HDL: 104 mg/dL (ref >39)
LDL Calculated: 75 mg/dL (ref 0–99)
Triglycerides: 209 mg/dL — ABNORMAL HIGH (ref 0–149)
VLDL Calculated: 42 mg/dL — ABNORMAL HIGH (ref 5–40)

## 2017-06-09 LAB — URIC ACID: Uric acid: 8.5 mg/dL — ABNORMAL HIGH (ref 2.5–7.1)
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
## 2017-06-10 ENCOUNTER — Telehealth (INDEPENDENT_AMBULATORY_CARE_PROVIDER_SITE_OTHER): Payer: Self-pay | Admitting: Internal Medicine

## 2017-06-10 ENCOUNTER — Other Ambulatory Visit: Payer: Self-pay

## 2017-06-10 NOTE — Telephone Encounter (Signed)
Patient stated medication verapamil (VERELAN PM) 120 MG 24 hr capsule is no longer covered by her insurance, but would still like a refill for the medication.    Preferred pharmacy:  [facility] [address], [place] - [address]  [phone] (Phone)  [phone] (Fax)

## 2017-06-10 NOTE — Telephone Encounter (Signed)
Patient advised she has a 90 day supply, she did not realize and will follow up as needed

## 2017-06-11 ENCOUNTER — Encounter: Payer: Self-pay | Admitting: Licensed Professional Counselor

## 2017-06-11 NOTE — Progress Notes (Incomplete)
Patient Name: Sherron Monday                                                      Final Treatment plan review  MRN:     [id number]  Date   Objective  # Disposition Codes Comments   2017-04-22 1 R Completed all initial paperwork, declined F/F interview, completed Treatment Intake Form, finished submitting breathalyzers and UDS.   2017-04-22 2 R Successfully completed and presented tasks 1-5.   04-22-2017 3 O Learned ways to manage anxiety, tolerate increased emotions in sobriety. It was recommended she get into 1:1 therapy, but insurance ended.   2017/04/22 4 O Was still working on a Futures trader.   22-Apr-2017 5 A Was not on anticraving medications at discharge.   04-22-2017 6 O Was learning about early coping skills: having a schedule, mindfulness, cravings management. Knows the negative consequences of drinking.   04-22-2017 7 R/O Had safety plan with Red flags/Green flags, learned about identifying and managing triggers/cravings, PAWS, RWS, HRS. She verbalized previous drinking patterns, never stopped to have a relapse.   2017/04/22 8 R/A Received the 12 "Promises" of AA, never went to a meeting.   04/22/17 9 O Was aware of the vulnerability during the first 90 days of sobriety.   04/22/17 10 R Completed IOP at 29 sessions due to lack of insurance. Was not going to meetings due to anxiety. Did not have a sponsor or home group.      Disposition Codes:   A - Active problem (no progress to date)  AR - Active, but referred out (note referral made)  D - Deferred to a later time (with explanation)  O - Ongoing, with progress  R - Resolved     Primary counselor on leave when pt unexpectedly had to complete on 2017/04/22. She maintained sobriety, but never went to AA (short term goal), didn't get into 1:1 therapy.

## 2017-06-12 ENCOUNTER — Telehealth (INDEPENDENT_AMBULATORY_CARE_PROVIDER_SITE_OTHER): Payer: Self-pay | Admitting: Internal Medicine

## 2017-06-12 NOTE — Telephone Encounter (Signed)
Left message follow up indicated 3-6 months per Lender

## 2017-06-12 NOTE — Telephone Encounter (Signed)
Pt's mom calls stating pt already went over her lab results with a nurse and is unsure if they recommend for her to come in again.     Pt can be reached at [phone]

## 2017-06-15 ENCOUNTER — Ambulatory Visit (INDEPENDENT_AMBULATORY_CARE_PROVIDER_SITE_OTHER): Payer: Medicaid Other | Admitting: Internal Medicine

## 2017-06-16 ENCOUNTER — Encounter (INDEPENDENT_AMBULATORY_CARE_PROVIDER_SITE_OTHER): Payer: Self-pay

## 2017-06-16 ENCOUNTER — Telehealth (INDEPENDENT_AMBULATORY_CARE_PROVIDER_SITE_OTHER): Payer: Self-pay

## 2017-06-16 NOTE — Telephone Encounter (Signed)
PA started Verapamil [employer] ER 120mg  ER Capsules - CoverMyMeds Key: [id number]

## 2017-06-22 ENCOUNTER — Encounter (INDEPENDENT_AMBULATORY_CARE_PROVIDER_SITE_OTHER): Payer: Self-pay | Admitting: Internal Medicine

## 2017-07-28 ENCOUNTER — Encounter (INDEPENDENT_AMBULATORY_CARE_PROVIDER_SITE_OTHER): Payer: Self-pay

## 2017-07-28 ENCOUNTER — Telehealth (INDEPENDENT_AMBULATORY_CARE_PROVIDER_SITE_OTHER): Payer: Self-pay

## 2017-07-28 NOTE — Telephone Encounter (Signed)
PA started Key: [id number]

## 2017-08-31 ENCOUNTER — Other Ambulatory Visit (INDEPENDENT_AMBULATORY_CARE_PROVIDER_SITE_OTHER): Payer: Self-pay | Admitting: Internal Medicine

## 2017-08-31 DIAGNOSIS — R42 Dizziness and giddiness: Secondary | ICD-10-CM

## 2017-08-31 DIAGNOSIS — F419 Anxiety disorder, unspecified: Secondary | ICD-10-CM

## 2017-08-31 NOTE — Telephone Encounter (Signed)
Refill sent.

## 2017-11-05 ENCOUNTER — Other Ambulatory Visit (INDEPENDENT_AMBULATORY_CARE_PROVIDER_SITE_OTHER): Payer: Self-pay

## 2017-11-05 DIAGNOSIS — F419 Anxiety disorder, unspecified: Secondary | ICD-10-CM

## 2017-11-05 MED ORDER — PAROXETINE HCL 20 MG PO TABS
ORAL_TABLET | ORAL | 1 refills | Status: DC
Start: 2017-11-05 — End: 2018-09-29

## 2017-11-05 NOTE — Telephone Encounter (Signed)
Refill sent. Pt due for visit prior to further refills.

## 2017-11-05 NOTE — Telephone Encounter (Signed)
Last refill was 08/22/2017 for #90 with no refills.   No upcoming appts on file

## 2017-11-06 NOTE — Telephone Encounter (Signed)
Pt advised.

## 2018-03-07 ENCOUNTER — Other Ambulatory Visit (INDEPENDENT_AMBULATORY_CARE_PROVIDER_SITE_OTHER): Payer: Self-pay | Admitting: Internal Medicine

## 2018-03-07 DIAGNOSIS — R42 Dizziness and giddiness: Secondary | ICD-10-CM

## 2018-03-09 NOTE — Telephone Encounter (Signed)
Refill sent. Pt due for visit prior to further refills.

## 2018-03-09 NOTE — Telephone Encounter (Signed)
Left a message advising that pt is due for ov and refills were sent.

## 2018-03-11 ENCOUNTER — Encounter (INDEPENDENT_AMBULATORY_CARE_PROVIDER_SITE_OTHER): Payer: Self-pay

## 2018-09-15 ENCOUNTER — Encounter (HOSPITAL_COMMUNITY): Payer: Self-pay

## 2018-09-15 ENCOUNTER — Other Ambulatory Visit: Payer: Self-pay

## 2018-09-15 ENCOUNTER — Inpatient Hospital Stay (HOSPITAL_COMMUNITY)
Admission: EM | Admit: 2018-09-15 | Discharge: 2018-09-22 | DRG: 640 | Disposition: A | Discharge status: Home / Self Care | Payer: Medicaid - Out of State | Attending: Internal Medicine | Admitting: Internal Medicine

## 2018-09-15 DIAGNOSIS — R000 Tachycardia, unspecified: Secondary | ICD-10-CM | POA: Diagnosis not present

## 2018-09-15 DIAGNOSIS — F10230 Alcohol dependence with withdrawal, uncomplicated: Secondary | ICD-10-CM | POA: Diagnosis not present

## 2018-09-15 DIAGNOSIS — D539 Nutritional anemia, unspecified: Secondary | ICD-10-CM | POA: Diagnosis present

## 2018-09-15 DIAGNOSIS — F1010 Alcohol abuse, uncomplicated: Secondary | ICD-10-CM

## 2018-09-15 DIAGNOSIS — E871 Hypo-osmolality and hyponatremia: Secondary | ICD-10-CM | POA: Diagnosis present

## 2018-09-15 DIAGNOSIS — R1084 Generalized abdominal pain: Secondary | ICD-10-CM | POA: Diagnosis not present

## 2018-09-15 DIAGNOSIS — K760 Fatty (change of) liver, not elsewhere classified: Secondary | ICD-10-CM | POA: Diagnosis present

## 2018-09-15 DIAGNOSIS — E876 Hypokalemia: Secondary | ICD-10-CM | POA: Diagnosis present

## 2018-09-15 DIAGNOSIS — E872 Acidosis: Secondary | ICD-10-CM | POA: Diagnosis present

## 2018-09-15 DIAGNOSIS — R1900 Intra-abdominal and pelvic swelling, mass and lump, unspecified site: Secondary | ICD-10-CM

## 2018-09-15 DIAGNOSIS — D649 Anemia, unspecified: Secondary | ICD-10-CM | POA: Diagnosis not present

## 2018-09-15 DIAGNOSIS — Y901 Blood alcohol level of 20-39 mg/100 ml: Secondary | ICD-10-CM | POA: Diagnosis present

## 2018-09-15 DIAGNOSIS — I10 Essential (primary) hypertension: Secondary | ICD-10-CM | POA: Diagnosis present

## 2018-09-15 DIAGNOSIS — R441 Visual hallucinations: Secondary | ICD-10-CM | POA: Clinically undetermined

## 2018-09-15 DIAGNOSIS — F10920 Alcohol use, unspecified with intoxication, uncomplicated: Secondary | ICD-10-CM

## 2018-09-15 DIAGNOSIS — F1020 Alcohol dependence, uncomplicated: Secondary | ICD-10-CM

## 2018-09-15 DIAGNOSIS — K709 Alcoholic liver disease, unspecified: Secondary | ICD-10-CM | POA: Diagnosis not present

## 2018-09-15 DIAGNOSIS — Z1159 Encounter for screening for other viral diseases: Secondary | ICD-10-CM

## 2018-09-15 DIAGNOSIS — F10231 Alcohol dependence with withdrawal delirium: Secondary | ICD-10-CM | POA: Diagnosis present

## 2018-09-15 DIAGNOSIS — F10220 Alcohol dependence with intoxication, uncomplicated: Secondary | ICD-10-CM | POA: Diagnosis not present

## 2018-09-15 DIAGNOSIS — F10229 Alcohol dependence with intoxication, unspecified: Secondary | ICD-10-CM | POA: Diagnosis present

## 2018-09-15 DIAGNOSIS — J189 Pneumonia, unspecified organism: Secondary | ICD-10-CM

## 2018-09-15 DIAGNOSIS — R531 Weakness: Secondary | ICD-10-CM

## 2018-09-15 DIAGNOSIS — R791 Abnormal coagulation profile: Secondary | ICD-10-CM

## 2018-09-15 DIAGNOSIS — G92 Toxic encephalopathy: Secondary | ICD-10-CM | POA: Diagnosis present

## 2018-09-15 DIAGNOSIS — F10929 Alcohol use, unspecified with intoxication, unspecified: Secondary | ICD-10-CM | POA: Diagnosis present

## 2018-09-15 DIAGNOSIS — M79606 Pain in leg, unspecified: Secondary | ICD-10-CM | POA: Diagnosis present

## 2018-09-15 DIAGNOSIS — E8729 Other acidosis: Secondary | ICD-10-CM | POA: Diagnosis present

## 2018-09-15 DIAGNOSIS — R109 Unspecified abdominal pain: Secondary | ICD-10-CM

## 2018-09-15 LAB — CBC
HCT: 27.1 % — ABNORMAL LOW (ref 36.0–46.0)
HCT: 26.4 % — ABNORMAL LOW (ref 36.0–46.0)
Hemoglobin: 9.2 g/dL — ABNORMAL LOW (ref 12.0–15.0)
Hemoglobin: 9.1 g/dL — ABNORMAL LOW (ref 12.0–15.0)
MCH: 32.7 pg (ref 26.0–34.0)
MCH: 33.5 pg (ref 26.0–34.0)
MCHC: 33.9 g/dL (ref 30.0–36.0)
MCHC: 34.5 g/dL (ref 30.0–36.0)
MCV: 96.4 fL (ref 80.0–100.0)
MCV: 97.1 fL (ref 80.0–100.0)
Platelets: 222 10*3/uL (ref 150–400)
Platelets: 206 10*3/uL (ref 150–400)
RBC: 2.81 MIL/uL — ABNORMAL LOW (ref 3.87–5.11)
RBC: 2.72 MIL/uL — ABNORMAL LOW (ref 3.87–5.11)
RDW: 15.8 % — ABNORMAL HIGH (ref 11.5–15.5)
RDW: 15.9 % — ABNORMAL HIGH (ref 11.5–15.5)
WBC: 11.6 10*3/uL — ABNORMAL HIGH (ref 4.0–10.5)
WBC: 12 10*3/uL — ABNORMAL HIGH (ref 4.0–10.5)
nRBC: 0 % (ref 0.0–0.2)
nRBC: 0 % (ref 0.0–0.2)

## 2018-09-15 LAB — URINALYSIS, ROUTINE W REFLEX MICROSCOPIC
Bilirubin Urine: NEGATIVE
Glucose, UA: NEGATIVE mg/dL
Hgb urine dipstick: NEGATIVE
Ketones, ur: NEGATIVE mg/dL
Leukocytes,Ua: NEGATIVE
Nitrite: NEGATIVE
Protein, ur: NEGATIVE mg/dL
Specific Gravity, Urine: 1.004 — ABNORMAL LOW (ref 1.005–1.030)
pH: 6 (ref 5.0–8.0)

## 2018-09-15 LAB — IRON AND TIBC
Iron: 55 ug/dL (ref 28–170)
Saturation Ratios: 27 % (ref 10.4–31.8)
TIBC: 203 ug/dL — ABNORMAL LOW (ref 250–450)
UIBC: 148 ug/dL

## 2018-09-15 LAB — COMPREHENSIVE METABOLIC PANEL
ALT: 46 U/L — ABNORMAL HIGH (ref 0–44)
AST: 142 U/L — ABNORMAL HIGH (ref 15–41)
Albumin: 2.8 g/dL — ABNORMAL LOW (ref 3.5–5.0)
Alkaline Phosphatase: 182 U/L — ABNORMAL HIGH (ref 38–126)
Anion gap: 20 — ABNORMAL HIGH (ref 5–15)
BUN: <5 mg/dL — ABNORMAL LOW (ref 6–20)
CO2: 38 mmol/L — ABNORMAL HIGH (ref 22–32)
Calcium: 8.5 mg/dL — ABNORMAL LOW (ref 8.9–10.3)
Chloride: 71 mmol/L — ABNORMAL LOW (ref 98–111)
Creatinine, Ser: 0.69 mg/dL (ref 0.44–1.00)
GFR calc Af Amer: >60 mL/min (ref >60)
GFR calc non Af Amer: >60 mL/min (ref >60)
Glucose, Bld: 84 mg/dL (ref 70–99)
Potassium: <2 mmol/L — CL (ref 3.5–5.1)
Sodium: 129 mmol/L — ABNORMAL LOW (ref 135–145)
Total Bilirubin: 0.8 mg/dL (ref 0.3–1.2)
Total Protein: 6.6 g/dL (ref 6.5–8.1)

## 2018-09-15 LAB — I-STAT BETA HCG BLOOD, ED (MC, WL, AP ONLY): I-stat hCG, quantitative: <5 m[IU]/mL (ref <5)

## 2018-09-15 LAB — RETICULOCYTES
Immature Retic Fract: 48.4 % — ABNORMAL HIGH (ref 2.3–15.9)
RBC.: 2.72 MIL/uL — ABNORMAL LOW (ref 3.87–5.11)
Retic Count, Absolute: 53.9 10*3/uL (ref 19.0–186.0)
Retic Ct Pct: 2 % (ref 0.4–3.1)

## 2018-09-15 LAB — FERRITIN: Ferritin: 773 ng/mL — ABNORMAL HIGH (ref 11–307)

## 2018-09-15 LAB — OSMOLALITY: Osmolality: 272 mOsm/kg — ABNORMAL LOW (ref 275–295)

## 2018-09-15 LAB — POC OCCULT BLOOD, ED: Fecal Occult Bld: NEGATIVE

## 2018-09-15 LAB — CREATININE, SERUM
Creatinine, Ser: 0.79 mg/dL (ref 0.44–1.00)
GFR calc Af Amer: >60 mL/min (ref >60)
GFR calc non Af Amer: >60 mL/min (ref >60)

## 2018-09-15 LAB — MAGNESIUM: Magnesium: 1.5 mg/dL — ABNORMAL LOW (ref 1.7–2.4)

## 2018-09-15 LAB — ETHANOL: Alcohol, Ethyl (B): 32 mg/dL — ABNORMAL HIGH (ref <10)

## 2018-09-15 LAB — TSH: TSH: 3.242 u[IU]/mL (ref 0.350–4.500)

## 2018-09-15 LAB — LIPASE, BLOOD: Lipase: 67 U/L — ABNORMAL HIGH (ref 11–51)

## 2018-09-15 LAB — TRANSFERRIN: Transferrin: 145 mg/dL — ABNORMAL LOW (ref 192–382)

## 2018-09-15 LAB — VITAMIN B12: Vitamin B-12: 417 pg/mL (ref 180–914)

## 2018-09-15 MED ORDER — MAGNESIUM SULFATE 2 GM/50ML IV SOLN
2.0000 g | Freq: Once | INTRAVENOUS | Status: AC
  Administered 2018-09-15: 2 g via INTRAVENOUS
  Filled 2018-09-15: qty 50

## 2018-09-15 MED ORDER — LORAZEPAM 1 MG PO TABS
1.0000 mg | ORAL_TABLET | Freq: Four times a day (QID) | ORAL | Status: AC | PRN
  Administered 2018-09-16 – 2018-09-17 (×3): 1 mg via ORAL
  Filled 2018-09-15 (×3): qty 1

## 2018-09-15 MED ORDER — POTASSIUM CHLORIDE CRYS ER 20 MEQ PO TBCR
40.0000 meq | EXTENDED_RELEASE_TABLET | ORAL | Status: AC
  Administered 2018-09-15 – 2018-09-16 (×5): 40 meq via ORAL
  Filled 2018-09-15 (×5): qty 2

## 2018-09-15 MED ORDER — VITAMIN B-1 100 MG PO TABS: 100.0000 mg | ORAL_TABLET | Freq: Every day | ORAL | Status: DC

## 2018-09-15 MED ORDER — SODIUM CHLORIDE 0.9 % IV BOLUS
1000.0000 mL | Freq: Once | INTRAVENOUS | Status: AC
  Administered 2018-09-15: 1000 mL via INTRAVENOUS

## 2018-09-15 MED ORDER — ONDANSETRON HCL 4 MG PO TABS: 4.0000 mg | ORAL_TABLET | Freq: Four times a day (QID) | ORAL | Status: DC | PRN

## 2018-09-15 MED ORDER — THIAMINE HCL 100 MG/ML IJ SOLN
Freq: Once | INTRAVENOUS | Status: AC
  Administered 2018-09-15: 19:00:00 via INTRAVENOUS
  Filled 2018-09-15: qty 1000

## 2018-09-15 MED ORDER — VITAMIN B-1 100 MG PO TABS
100.0000 mg | ORAL_TABLET | Freq: Every day | ORAL | Status: DC
  Administered 2018-09-15 – 2018-09-22 (×8): 100 mg via ORAL
  Filled 2018-09-15 (×8): qty 1

## 2018-09-15 MED ORDER — ADULT MULTIVITAMIN W/MINERALS CH
1.0000 | ORAL_TABLET | Freq: Every day | ORAL | Status: DC
  Administered 2018-09-15 – 2018-09-22 (×8): 1 via ORAL
  Filled 2018-09-15 (×8): qty 1

## 2018-09-15 MED ORDER — FOLIC ACID 1 MG PO TABS
1.0000 mg | ORAL_TABLET | Freq: Every day | ORAL | Status: DC
  Administered 2018-09-15 – 2018-09-22 (×8): 1 mg via ORAL
  Filled 2018-09-15 (×8): qty 1

## 2018-09-15 MED ORDER — POTASSIUM CHLORIDE 10 MEQ/100ML IV SOLN
10.0000 meq | INTRAVENOUS | Status: AC
  Administered 2018-09-15 (×4): 10 meq via INTRAVENOUS
  Filled 2018-09-15 (×4): qty 100

## 2018-09-15 MED ORDER — FOLIC ACID 1 MG PO TABS: 1.0000 mg | ORAL_TABLET | Freq: Every day | ORAL | Status: DC

## 2018-09-15 MED ORDER — LORAZEPAM 2 MG/ML IJ SOLN
1.0000 mg | Freq: Four times a day (QID) | INTRAMUSCULAR | Status: AC | PRN
  Administered 2018-09-16 – 2018-09-18 (×3): 1 mg via INTRAVENOUS
  Filled 2018-09-15 (×3): qty 1

## 2018-09-15 MED ORDER — ACETAMINOPHEN 650 MG RE SUPP: 650.0000 mg | Freq: Four times a day (QID) | RECTAL | Status: DC | PRN

## 2018-09-15 MED ORDER — OXYCODONE HCL 5 MG PO TABS
5.0000 mg | ORAL_TABLET | Freq: Once | ORAL | Status: AC
  Administered 2018-09-15: 5 mg via ORAL
  Filled 2018-09-15: qty 1

## 2018-09-15 MED ORDER — THIAMINE HCL 100 MG/ML IJ SOLN: 100.0000 mg | Freq: Every day | INTRAMUSCULAR | Status: DC

## 2018-09-15 MED ORDER — ADULT MULTIVITAMIN W/MINERALS CH: 1.0000 | ORAL_TABLET | Freq: Every day | ORAL | Status: DC

## 2018-09-15 MED ORDER — ONDANSETRON HCL 4 MG/2ML IJ SOLN: 4.0000 mg | Freq: Four times a day (QID) | INTRAMUSCULAR | Status: DC | PRN

## 2018-09-15 MED ORDER — ACETAMINOPHEN 325 MG PO TABS
650.0000 mg | ORAL_TABLET | Freq: Four times a day (QID) | ORAL | Status: DC | PRN
  Administered 2018-09-15 – 2018-09-20 (×4): 650 mg via ORAL
  Filled 2018-09-15 (×3): qty 2

## 2018-09-15 MED ORDER — POTASSIUM CHLORIDE CRYS ER 20 MEQ PO TBCR
40.0000 meq | EXTENDED_RELEASE_TABLET | Freq: Once | ORAL | Status: AC
  Administered 2018-09-15: 40 meq via ORAL
  Filled 2018-09-15: qty 2

## 2018-09-15 MED ORDER — ENOXAPARIN SODIUM 40 MG/0.4ML ~~LOC~~ SOLN
40.0000 mg | SUBCUTANEOUS | Status: DC
  Administered 2018-09-15 – 2018-09-21 (×6): 40 mg via SUBCUTANEOUS
  Filled 2018-09-15 (×8): qty 0.4

## 2018-09-15 MED ORDER — ACETAMINOPHEN 325 MG PO TABS
650.0000 mg | ORAL_TABLET | Freq: Four times a day (QID) | ORAL | Status: DC | PRN
  Filled 2018-09-15: qty 2

## 2018-09-15 MED ORDER — SODIUM CHLORIDE 0.9% FLUSH
3.0000 mL | Freq: Once | INTRAVENOUS | Status: AC
  Administered 2018-09-15: 3 mL via INTRAVENOUS

## 2018-09-15 MED ORDER — FENTANYL CITRATE (PF) 100 MCG/2ML IJ SOLN
25.0000 ug | Freq: Once | INTRAMUSCULAR | Status: AC
  Administered 2018-09-16: 25 ug via INTRAVENOUS
  Filled 2018-09-15: qty 2

## 2018-09-15 NOTE — ED Provider Notes (Signed)
[facility]-EMERGENCY DEPT Provider Note   CSN: [id number] Arrival date &[id number] time: 09/15/18  1048     History   Chief Complaint Chief Complaint  Patient presents with  . Fall  . Extremity Weakness    HPI Donna Patterson is a 33 y.o. female.  She is complaining of 1 week of generalized weakness mostly in her legs and today she fell and could not get up.  She said this happened to her once before not as severe and she was low in potassium.  She denies any injury.  She says her legs feel tight and achy.  She drinks daily 5+ nips a day and said she had one this morning.  She said she gets shaky if she does not drink.  EMS found her with a blood pressure in the 90s and gave her some normal saline during transfer.  No fevers or chills no cough no chest pain no abdominal pain no vomiting diarrhea or urinary symptoms.  She takes no medication regularly.     The history is provided by the patient.  Fall This is a new problem. The current episode started 1 to 2 hours ago. The problem has not changed since onset.Pertinent negatives include no chest pain, no abdominal pain, no headaches and no shortness of breath. The symptoms are aggravated by standing and walking. Nothing relieves the symptoms. She has tried nothing for the symptoms. The treatment provided no relief.  Extremity Weakness This is a new problem. The current episode started more than 1 week ago. The problem occurs constantly. The problem has been gradually worsening. Pertinent negatives include no chest pain, no abdominal pain, no headaches and no shortness of breath. The symptoms are aggravated by walking and standing. Nothing relieves the symptoms. She has tried nothing for the symptoms. The treatment provided no relief.    History reviewed. No pertinent past medical history.  There are no active problems to display for this patient.   History reviewed. No pertinent surgical history.   OB History   No obstetric  history on file.      Home Medications    Prior to Admission medications   Not on File    Family History Family History  Problem Relation Age of Onset  . Healthy Mother   . Healthy Father     Social History Social History   Tobacco Use  . Smoking status: Current Every Day Smoker    Packs/day: 0.50    Types: Cigarettes  . Smokeless tobacco: Never Used  Substance Use Topics  . Alcohol use: Yes    Comment: daily use 5-6 or more mini liquor bottles a day  . Drug use: Never     Allergies   Patient has no known allergies.   Review of Systems Review of Systems  Constitutional: Negative for fever.  HENT: Negative for sore throat.   Eyes: Negative for visual disturbance.  Respiratory: Negative for shortness of breath.   Cardiovascular: Negative for chest pain.  Gastrointestinal: Negative for abdominal pain.  Genitourinary: Negative for dysuria.  Musculoskeletal: Positive for extremity weakness and myalgias. Negative for neck pain.  Skin: Negative for rash.  Neurological: Negative for headaches.     Physical Exam Updated Vital Signs BP 103/70 (BP Location: Right Arm)   Pulse 93   Temp 99.1 F (37.3 C) (Oral)   Resp 14   Ht 5\' 4"  (1.626 m)   Wt 56.7 kg   SpO2 99%   BMI 21.46 kg/m  Physical Exam Vitals signs and nursing note reviewed.  Constitutional:      General: She is not in acute distress.    Appearance: She is well-developed.  HENT:     Head: Normocephalic and atraumatic.  Eyes:     Conjunctiva/sclera: Conjunctivae normal.  Neck:     Musculoskeletal: Neck supple.  Cardiovascular:     Rate and Rhythm: Normal rate and regular rhythm.     Heart sounds: No murmur.  Pulmonary:     Effort: Pulmonary effort is normal. No respiratory distress.     Breath sounds: Normal breath sounds.  Abdominal:     Palpations: Abdomen is soft.     Tenderness: There is no abdominal tenderness.  Musculoskeletal: Normal range of motion.        General: No  swelling.     Right lower leg: No edema.     Left lower leg: No edema.  Skin:    General: Skin is warm and dry.     Capillary Refill: Capillary refill takes less than 2 seconds.  Neurological:     General: No focal deficit present.     Mental Status: She is alert and oriented to person, place, and time.     Sensory: No sensory deficit.      ED Treatments / Results  Labs (all labs ordered are listed, but only abnormal results are displayed) Labs Reviewed  CBC - Abnormal; Notable for the following components:      Result Value   WBC 11.6 (*)    RBC 2.81 (*)    Hemoglobin 9.2 (*)    HCT 27.1 (*)    RDW 15.8 (*)    All other components within normal limits  URINALYSIS, ROUTINE W REFLEX MICROSCOPIC - Abnormal; Notable for the following components:   Specific Gravity, Urine 1.004 (*)    All other components within normal limits  COMPREHENSIVE METABOLIC PANEL - Abnormal; Notable for the following components:   Sodium 129 (*)    Potassium <2.0 (*)    Chloride 71 (*)    CO2 38 (*)    BUN <5 (*)    Calcium 8.5 (*)    Albumin 2.8 (*)    AST 142 (*)    ALT 46 (*)    Alkaline Phosphatase 182 (*)    Anion gap 20 (*)    All other components within normal limits  LIPASE, BLOOD - Abnormal; Notable for the following components:   Lipase 67 (*)    All other components within normal limits  MAGNESIUM - Abnormal; Notable for the following components:   Magnesium 1.5 (*)    All other components within normal limits  ETHANOL - Abnormal; Notable for the following components:   Alcohol, Ethyl (B) 32 (*)    All other components within normal limits  CBC - Abnormal; Notable for the following components:   WBC 12.0 (*)    RBC 2.72 (*)    Hemoglobin 9.1 (*)    HCT 26.4 (*)    RDW 15.9 (*)    All other components within normal limits  IRON AND TIBC - Abnormal; Notable for the following components:   TIBC 203 (*)    All other components within normal limits  FERRITIN - Abnormal; Notable  for the following components:   Ferritin 773 (*)    All other components within normal limits  TRANSFERRIN - Abnormal; Notable for the following components:   Transferrin 145 (*)    All other components within normal  limits  RETICULOCYTES - Abnormal; Notable for the following components:   RBC. 2.72 (*)    Immature Retic Fract 48.4 (*)    All other components within normal limits  OSMOLALITY - Abnormal; Notable for the following components:   Osmolality 272 (*)    All other components within normal limits  NOVEL CORONAVIRUS, NAA (HOSPITAL ORDER, SEND-OUT TO REF LAB)  CREATININE, SERUM  TSH  VITAMIN B12  HIV ANTIBODY (ROUTINE TESTING W REFLEX)  COMPREHENSIVE METABOLIC PANEL  PROTIME-INR  FOLATE RBC  HEPATITIS PANEL, ACUTE  OSMOLALITY, URINE  SODIUM, URINE, RANDOM  URINALYSIS, ROUTINE W REFLEX MICROSCOPIC  I-STAT BETA HCG BLOOD, ED ([facility], [facility], [facility] ONLY)  POC OCCULT BLOOD, ED    EKG EKG Interpretation  Date/Time:  Wednesday September 15 2018 11:33:21 EDT Ventricular Rate:  98 PR Interval:    QRS Duration: 98 QT Interval:  465 QTC Calculation: 594 R Axis:   48 Text Interpretation:  Sinus rhythm Abnormal T, consider ischemia, diffuse leads Prolonged QT interval no prior to compare with Confirmed by Meridee ScoreButler, Michael [phone]([id number]) on 09/15/2018 11:58:06 AM   Radiology No results found.  Procedures .Critical Care Performed by: Terrilee FilesButler, Michael C, MD Authorized by: Terrilee FilesButler, Michael C, MD   Critical care provider statement:    Critical care time (minutes):  45   Critical care time was exclusive of:  Separately billable procedures and treating other patients   Critical care was necessary to treat or prevent imminent or life-threatening deterioration of the following conditions:  Metabolic crisis   Critical care was time spent personally by me on the following activities:  Discussions with consultants, evaluation of patient's response to treatment, examination of patient, ordering and  performing treatments and interventions, ordering and review of laboratory studies, ordering and review of radiographic studies, pulse oximetry, re-evaluation of patient's condition, obtaining history from patient or surrogate, review of old charts and development of treatment plan with patient or surrogate   I assumed direction of critical care for this patient from another provider in my specialty: no     (including critical care time)  Medications Ordered in ED Medications  sodium chloride flush (NS) 0.9 % injection 3 mL (has no administration in time range)  thiamine (VITAMIN B-1) tablet 100 mg (has no administration in time range)  folic acid (FOLVITE) tablet 1 mg (has no administration in time range)  multivitamin with minerals tablet 1 tablet (has no administration in time range)  sodium chloride 0.9 % bolus 1,000 mL (has no administration in time range)     Initial Impression / Assessment and Plan / ED Course  I have reviewed the triage vital signs and the nursing notes.  Pertinent labs & imaging results that were available during my care of the patient were reviewed by me and considered in my medical decision making (see chart for details).  Clinical Course as of Sep 15 2142  Wed Sep 15, 2018  [id number] 33 year old female with no past medical history, daily drinker, here with 1 week of generalized weakness and now fall unable to get up.  Differential diagnosis includes dehydration, metabolic derangement, stroke, anemia, infection.  She is a low-grade temp here but no other symptoms.  She has no focal findings so doubt stroke.  Her EKG shows QT prolongation and diffuse ischemia so I think this is more likely, be metabolic.  We will give her vitamins fluids and checking screening labs.   [MB]  1334 Patient's lab predictably are quite abnormal with her being  anemic in an unclear baseline.  She is also get a severe metabolic derangement with potassium less than 2 magnesium level 1.5.  Have  ordered her some IV magnesium and IV and p.o. potassium.  I did a rectal exam with nurse as chaperone.  I recommended the patient be admitted to the hospital and she is in agreement.  She understands that she will need to be on a CIWA for potential for alcohol withdrawal.   [MB]  1350 [facility] Doristine Bosworth who will admit for continued care.    [MB]    Clinical Course User Index [MB] Hayden Rasmussen, MD   Donna Patterson was evaluated in Emergency Department on 09/15/2018 for the symptoms described in the history of present illness. She was evaluated in the context of the global COVID-19 pandemic, which necessitated consideration that the patient might be at risk for infection with the SARS-CoV-2 virus that causes COVID-19. Institutional protocols and algorithms that pertain to the evaluation of patients at risk for COVID-19 are in a state of rapid change based on information released by regulatory bodies including the CDC and federal and state organizations. These policies and algorithms were followed during the patient's care in the ED.       Final Clinical Impressions(s) / ED Diagnoses   Final diagnoses:  Weakness  Hypokalemia  Hypomagnesemia  Uncomplicated alcohol dependence (HCC)  Anemia, unspecified type    ED Discharge Orders    None       Hayden Rasmussen, MD 09/15/18 2145

## 2018-09-15 NOTE — Progress Notes (Signed)
Attempted to call for report at 1512 placed on hold.

## 2018-09-15 NOTE — ED Notes (Signed)
Attempted to give report to Jarrett Soho, RN for room [phone]. Patient will need cardiac telemetry for the first 24 hours, therefore she needs another assigned room, since Jarrett Soho, RN reports they do not take telemetry patients. Informed Santiago Glad, MUS to notify bed placement.

## 2018-09-15 NOTE — H&P (Signed)
History and Physical    Donna Patterson ZSW:[id number] DOB: [birth year] DOA: 09/15/2018  PCP: Patient, No Pcp Per  Patient coming from: Home  I have personally briefly reviewed patient's old medical records in [place]  Chief Complaint: Bilateral leg weakness  HPI: Donna Patterson is a 32 y.o. female with medical history significant of chronic alcoholism and alcohol abuse presented to ER with a complaint of bilateral leg weakness and a fall.  According to patient, she has been feeling weak especially in her legs for about a week.  The weakness has been getting worse and this morning, it was worse to the point that she could not stand on her feet and fell on the ground but did not incur any injury.  She called EMS.  Per report, when EMS arrived, her blood pressure was 96/60.  She was given some fluid boluses and was brought into the emergency department.  Patient denies any headache, dizziness, chest pain, shortness of breath, fever, sweating, chills, nausea, vomiting, any problem with bowel movements or urination, any recent sick contact or any recent travel.  She admits to drinking approximately 5+ nips a day and her last drink was this morning.  She has been drinking that much daily for a long time.  Denies any history of alcohol withdrawal seizure or any admission to hospital in the ICU due to alcohol withdrawal.  ED Course: She was hemodynamically stable in the emergency department. CMP showed elevated LFTs consistent with possible alcoholic liver disease, severe hypokalemia of 2.0 and hyponatremia of 129 as well as high anion gap metabolic acidosis.  Also mild leukocytosis with anemia and hemoglobin of 9.2.  Review of Systems: As per HPI otherwise 10 point review of systems negative.    History reviewed. No pertinent past medical history.  History reviewed. No pertinent surgical history.   reports that she has been smoking cigarettes. She has been smoking about 0.50 packs per day. She has  never used smokeless tobacco. She reports current alcohol use. She reports that she does not use drugs.  No Known Allergies  Family History  Problem Relation Age of Onset  . Healthy Mother   . Healthy Father     Prior to Admission medications   Medication Sig Start Date End Date Taking? Authorizing Provider  loratadine (CLARITIN) 10 MG tablet Take 10 mg by mouth daily.   Yes [provider]  Multiple Vitamin (MULTIVITAMIN) LIQD Take 5 mLs by mouth daily.   Yes [provider]    Physical Exam: Vitals:   09/15/18 1245 09/15/18 1247 09/15/18 1315 09/15/18 1345  BP: (!) 123/94 (!) 123/94 (!) 129/94 (!) 121/95  Pulse: 95 95 (!) 108 96  Resp: 16  18 18   Temp:      TempSrc:      SpO2: 100%  98% 100%  Weight:      Height:        Constitutional: NAD, calm, comfortable Vitals:   09/15/18 1245 09/15/18 1247 09/15/18 1315 09/15/18 1345  BP: (!) 123/94 (!) 123/94 (!) 129/94 (!) 121/95  Pulse: 95 95 (!) 108 96  Resp: 16  18 18   Temp:      TempSrc:      SpO2: 100%  98% 100%  Weight:      Height:       Eyes: PERRL, lids and conjunctivae normal ENMT: Mucous membranes are moist. Posterior pharynx clear of any exudate or lesions.Normal dentition.  Neck: normal, supple, no masses, no thyromegaly Respiratory:  clear to auscultation bilaterally, no wheezing, no crackles. Normal respiratory effort. No accessory muscle use.  Cardiovascular: Regular rate and rhythm, no murmurs / rubs / gallops. No extremity edema. 2+ pedal pulses. No carotid bruits.  Abdomen: no tenderness, no masses palpated. No hepatosplenomegaly. Bowel sounds positive.  Musculoskeletal: no clubbing / cyanosis. No joint deformity upper and lower extremities. Good ROM, no contractures. Normal muscle tone.  Skin: no rashes, lesions, ulcers. No induration Neurologic: CN 2-12 grossly intact. Sensation intact, DTR normal. Strength 5/5 in all 4.  Psychiatric: Normal judgment and poor. Alert and oriented x 3.  Normal mood.    Labs on Admission: I have personally reviewed following labs and imaging studies  CBC: Recent Labs  Lab 09/15/18 1238  WBC 11.6*  HGB 9.2*  HCT 27.1*  MCV 96.4  PLT 222   Basic Metabolic Panel: Recent Labs  Lab 09/15/18 1238  NA 129*  K <2.0*  CL 71*  CO2 38*  GLUCOSE 84  BUN <5*  CREATININE 0.69  CALCIUM 8.5*  MG 1.5*   GFR: Estimated Creatinine Clearance: 85.6 mL/min (by C-G formula based on SCr of 0.69 mg/dL). Liver Function Tests: Recent Labs  Lab 09/15/18 1238  AST 142*  ALT 46*  ALKPHOS 182*  BILITOT 0.8  PROT 6.6  ALBUMIN 2.8*   Recent Labs  Lab 09/15/18 1238  LIPASE 67*   No results for input(s): AMMONIA in the last 168 hours. Coagulation Profile: No results for input(s): INR, PROTIME in the last 168 hours. Cardiac Enzymes: No results for input(s): CKTOTAL, CKMB, CKMBINDEX, TROPONINI in the last 168 hours. BNP (last 3 results) No results for input(s): PROBNP in the last 8760 hours. HbA1C: No results for input(s): HGBA1C in the last 72 hours. CBG: No results for input(s): GLUCAP in the last 168 hours. Lipid Profile: No results for input(s): CHOL, HDL, LDLCALC, TRIG, CHOLHDL, LDLDIRECT in the last 72 hours. Thyroid Function Tests: No results for input(s): TSH, T4TOTAL, FREET4, T3FREE, THYROIDAB in the last 72 hours. Anemia Panel: No results for input(s): VITAMINB12, FOLATE, FERRITIN, TIBC, IRON, RETICCTPCT in the last 72 hours. Urine analysis:    Component Value Date/Time   COLORURINE YELLOW 09/15/2018 1403   APPEARANCEUR CLEAR 09/15/2018 1403   LABSPEC 1.004 (L) 09/15/2018 1403   PHURINE 6.0 09/15/2018 1403   GLUCOSEU NEGATIVE 09/15/2018 1403   HGBUR NEGATIVE 09/15/2018 1403   BILIRUBINUR NEGATIVE 09/15/2018 1403   KETONESUR NEGATIVE 09/15/2018 1403   PROTEINUR NEGATIVE 09/15/2018 1403   NITRITE NEGATIVE 09/15/2018 1403   LEUKOCYTESUR NEGATIVE 09/15/2018 1403    Radiological Exams on Admission: No results found.   EKG: Independently reviewed.  Sinus rhythm with inverted T waves diffusely and prolonged QR interval.  Assessment/Plan Active Problems:   Acute hypokalemia   Alcohol intoxication (HCC)   Alcohol dependence (HCC)   Hypomagnesemia   High anion gap metabolic acidosis   Alcoholic liver disease (HCC)    Bilateral lower extremity weakness: This is likely due to her chronic alcoholism resulting in hypomagnesemia and severe hypokalemia as well as hyponatremia.   Hypomagnesemia and hypokalemia: Replace both.  Monitor on telemetry.  Repeat EKG in the morning.  Hyponatremia: Likely secondary to chronic alcoholism.  Check serum and urine osmolality and serum sodium and UA.  High anion gap metabolic acidosis: [place] due to alcoholism and not hydrating herself enough.  We will hydrate her with banana bag and repeat labs in the morning.  Monitor closely.  Alcoholic liver disease: AST more than double ALT indicating  possible alcoholic liver disease with elevated alkaline phosphatase.  No prior labs available to compare.  Will check acute viral hepatitis panel and repeat LFTs in the morning.  Anemia: Level 9.2.  No previous labs available in the chart to compare with.  Patient unaware of any anemia in the past.  No history of menorrhagia.  Expand work-up with basic iron studies, folic acid and vitamin B12.  Fecal occult blood test is negative in the emergency department.  Alcohol intoxication: No signs of withdrawal at this point in time.  Monitor on CIWA protocol with as needed Ativan as well as banana bag and multivitamin, folic acid and thiamine.  DVT prophylaxis: Lovenox Code Status: Full code Family Communication: None available at bedside.  Patient alert and oriented and competent.  Discussed plan of care with the patient in length. Disposition Plan: Most likely home in next 2 to 3 days. Consults called: None Admission status: Inpatient   Hughie Clossavi Carlinda Ohlson MD [facility] Pager [phone]  [phone]  If 7PM-7AM, please contact night-coverage [email or web address] Password [email or web address]  09/15/2018, 2:42 PM

## 2018-09-15 NOTE — ED Notes (Signed)
Date and time results received: 09/15/18 1320 (use smartphrase ".now" to insert current time)  Test: K+ Critical Value: <2.0  Name of Provider Notified: Dr. Kendra Opitz, RN  Orders Received? Or Actions Taken?:

## 2018-09-15 NOTE — ED Notes (Signed)
Assisted patient to going to the restroom and back to stretcher with assistance of myself and the steady. Pt tolerated well with steady. Patient was originally unstable on her feet with a one person assist.

## 2018-09-15 NOTE — ED Triage Notes (Addendum)
Per EMS- Patient called EMS and stated she fell and could not get up. Patient c/o weakness both extremities. EMS reported that lower extremities were much smaller than rest of her body,but patient denied any medical history. Patient stated she lived with her boyfriend ,but no furniture in the residence. Patient was given 400 ml NS prior to arrival to the ED due to initial BP of 96/60.  Patient added during triage that she had numbness of her finger tips and her abdomen. Patient states I feel really shaky when I stand up.

## 2018-09-15 NOTE — ED Notes (Signed)
ED TO INPATIENT HANDOFF REPORT  Name/Age/Gender Donna Patterson 33 y.o. female  Code Status   Home/SNF/Other Home  Chief Complaint weakness and pain  Level of Care/Admitting Diagnosis ED Disposition    ED Disposition Condition Comment   Admit  [facility] [[id number]]  Level of Care: Telemetry [5]  Admit to tele based on following criteria: Other see comments  Comments: SEVERE HYPOKALEMIA  Covid Evaluation: N/A  Diagnosis: Hypokalemia [409811][[id number]]  Admitting Physician: Hughie ClossPAHWANI, RAVI [[id number]]  Attending Physician: Hughie ClossPAHWANI, RAVI [[id number]]  Estimated length of stay: 3 - 4 days  Certification:: I certify this patient will need inpatient services for at least 2 midnights  PT Class (Do Not Modify): Inpatient [101]  PT Acc Code (Do Not Modify): Private [1]       Medical History History reviewed. No pertinent past medical history.  Allergies No Known Allergies  IV Location/Drains/Wounds Patient Lines/Drains/Airways Status   Active Line/Drains/Airways    Name:   Placement date:   Placement time:   Site:   Days:   Peripheral IV Anterior;Left Forearm   -    -    Forearm             Labs/Imaging Results for orders placed or performed during the hospital encounter of 09/15/18 (from the past 48 hour(s))  CBC     Status: Abnormal   Collection Time: 09/15/18 12:38 PM  Result Value Ref Range   WBC 11.6 (H) 4.0 - 10.5 K/uL   RBC 2.81 (L) 3.87 - 5.11 MIL/uL   Hemoglobin 9.2 (L) 12.0 - 15.0 g/dL   HCT 65.727.1 (L) 84.636.0 - 96.246.0 %   MCV 96.4 80.0 - 100.0 fL   MCH 32.7 26.0 - 34.0 pg   MCHC 33.9 30.0 - 36.0 g/dL   RDW 95.215.8 (H) 84.111.5 - 32.415.5 %   Platelets 222 150 - 400 K/uL   nRBC 0.0 0.0 - 0.2 %    Comment: Performed at [facility], [address] [place], [place] [address]  Comprehensive metabolic panel     Status: Abnormal   Collection Time: 09/15/18 12:38 PM  Result Value Ref Range   Sodium 129 (L) 135 - 145 mmol/L   Potassium  <2.0 (LL) 3.5 - 5.1 mmol/L    Comment: CRITICAL RESULT CALLED TO, READ BACK BY AND VERIFIED WITH: TIM SMITH,RN [phone] @ 1316 BY J SCOTTON    Chloride 71 (L) 98 - 111 mmol/L   CO2 38 (H) 22 - 32 mmol/L   Glucose, Bld 84 70 - 99 mg/dL   BUN <5 (L) 6 - 20 mg/dL   Creatinine, Ser 4.400.69 0.44 - 1.00 mg/dL   Calcium 8.5 (L) 8.9 - 10.3 mg/dL   Total Protein 6.6 6.5 - 8.1 g/dL   Albumin 2.8 (L) 3.5 - 5.0 g/dL   AST 347142 (H) 15 - 41 U/L   ALT 46 (H) 0 - 44 U/L   Alkaline Phosphatase 182 (H) 38 - 126 U/L   Total Bilirubin 0.8 0.3 - 1.2 mg/dL   GFR calc non Af Amer >60 >60 mL/min   GFR calc Af Amer >60 >60 mL/min   Anion gap 20 (H) 5 - 15    Comment: Performed at [facility], [address] [place], [place] [address]  Lipase, blood     Status: Abnormal   Collection Time: 09/15/18 12:38 PM  Result Value Ref Range   Lipase 67 (H) 11 - 51 U/L  Comment: Performed at [facility], [place] [address], [place], [place] [address]  Magnesium     Status: Abnormal   Collection Time: 09/15/18 12:38 PM  Result Value Ref Range   Magnesium 1.5 (L) 1.7 - 2.4 mg/dL    Comment: Performed at [facility], [place] [address] [place], [place] [address]  Ethanol     Status: Abnormal   Collection Time: 09/15/18 12:38 PM  Result Value Ref Range   Alcohol, Ethyl (B) 32 (H) <10 mg/dL    Comment: (NOTE) Lowest detectable limit for serum alcohol is 10 mg/dL. For medical purposes only. Performed at [facility], [place] [address] [place], [place] [address]   I-Stat beta hCG blood, ED     Status: None   Collection Time: 09/15/18 12:44 PM  Result Value Ref Range   I-stat hCG, quantitative <5.0 <5 mIU/mL   Comment 3            Comment:   GEST. AGE      CONC.  (mIU/mL)   <=1 WEEK        5 - 50     2 WEEKS       50 - 500     3 WEEKS       100 - 10,000     4 WEEKS     1,000 - 30,000        FEMALE AND NON-PREGNANT FEMALE:     LESS THAN 5 mIU/mL    POC occult blood, ED Provider will collect     Status: None   Collection Time: 09/15/18  1:33 PM  Result Value Ref Range   Fecal Occult Bld NEGATIVE NEGATIVE  Urinalysis, Routine w reflex microscopic     Status: Abnormal   Collection Time: 09/15/18  2:03 PM  Result Value Ref Range   Color, Urine YELLOW YELLOW   APPearance CLEAR CLEAR   Specific Gravity, Urine 1.004 (L) 1.005 - 1.030   pH 6.0 5.0 - 8.0   Glucose, UA NEGATIVE NEGATIVE mg/dL   Hgb urine dipstick NEGATIVE NEGATIVE   Bilirubin Urine NEGATIVE NEGATIVE   Ketones, ur NEGATIVE NEGATIVE mg/dL   Protein, ur NEGATIVE NEGATIVE mg/dL   Nitrite NEGATIVE NEGATIVE   Leukocytes,Ua NEGATIVE NEGATIVE    Comment: Performed at [facility], [place] [address], [place], [place] [address]   No results found.  Pending Labs Unresulted Labs (From admission, onward)    Start     Ordered   09/15/18 1207  Novel Coronavirus,NAA,(SEND-OUT TO REF LAB - TAT 24-48 hrs); Hosp Order  (Asymptomatic Patients Labs)  Once,   STAT    Question:  Rule Out  Answer:  Yes   09/15/18 1207   Signed and Held  HIV antibody (Routine Testing)  Once,   R     Signed and Held   Signed and Held  CBC  (enoxaparin (LOVENOX)    CrCl >/= 30 ml/min)  Once,   R    Comments: Baseline for enoxaparin therapy IF NOT ALREADY DRAWN.  Notify MD if PLT < 100 K.    Signed and Held   Signed and Held  Creatinine, serum  (enoxaparin (LOVENOX)    CrCl >/= 30 ml/min)  Once,   R    Comments: Baseline for enoxaparin therapy IF NOT ALREADY DRAWN.    Signed and Held   Signed and Held  Creatinine, serum  (enoxaparin (LOVENOX)    CrCl >/= 30 ml/min)  Weekly,  R    Comments: while on enoxaparin therapy    Signed and Held   Signed and Held  TSH  Once,   R     Signed and Held   Signed and Held  Comprehensive metabolic panel  Tomorrow morning,   R     Signed and Held   Signed and Armed forces training and education officerHeld  Basic metabolic panel  Tomorrow morning,   R     Signed and Held   Signed and Held   Protime-INR  Tomorrow morning,   R     Signed and Held   Signed and Held  Iron and TIBC  Once,   R     Signed and Held   Medical illustratorigned and Held  Ferritin  Once,   R     Signed and Held   Signed and Held  Transferrin  Once,   R     Signed and Held   Signed and Held  Reticulocytes  Once,   R     Signed and Held   Signed and Held  Vitamin B12  Once,   R     Signed and Held   Medical illustratorigned and Held  Folate RBC  Once,   R     Signed and Held   Signed and Held  Hepatitis panel, acute  Once,   R     Signed and Held   Signed and Held  Osmolality, urine  Once,   R     Signed and Held   Signed and Held  Osmolality  Once,   R     Signed and Held   Signed and Held  Sodium, urine, random  Once,   R     Signed and Held   Signed and Held  Urinalysis, Routine w reflex microscopic  Once,   R     Signed and Held          Vitals/Pain Today's Vitals   09/15/18 1247 09/15/18 1315 09/15/18 1345 09/15/18 1545  BP: (!) 123/94 (!) 129/94 (!) 121/95 120/79  Pulse: 95 (!) 108 96 95  Resp:  18 18 16   Temp:      TempSrc:      SpO2:  98% 100% 94%  Weight:      Height:      PainSc:        Isolation Precautions No active isolations  Medications Medications  thiamine (VITAMIN B-1) tablet 100 mg (100 mg Oral Given 09/15/18 1246)  folic acid (FOLVITE) tablet 1 mg (1 mg Oral Given 09/15/18 1246)  multivitamin with minerals tablet 1 tablet (1 tablet Oral Given 09/15/18 1245)  potassium chloride 10 mEq in 100 mL IVPB (10 mEq Intravenous New Bag/Given 09/15/18 1539)  sodium chloride flush (NS) 0.9 % injection 3 mL (3 mLs Intravenous Given 09/15/18 1246)  sodium chloride 0.9 % bolus 1,000 mL (0 mLs Intravenous Stopped 09/15/18 1537)  magnesium sulfate IVPB 2 g 50 mL (0 g Intravenous Stopped 09/15/18 1537)  potassium chloride SA (K-DUR) CR tablet 40 mEq (40 mEq Oral Given 09/15/18 1411)    Mobility: Unsteady on his feet   Gave report to [place] 1445.

## 2018-09-16 DIAGNOSIS — F10230 Alcohol dependence with withdrawal, uncomplicated: Secondary | ICD-10-CM

## 2018-09-16 DIAGNOSIS — D649 Anemia, unspecified: Secondary | ICD-10-CM

## 2018-09-16 DIAGNOSIS — R531 Weakness: Secondary | ICD-10-CM

## 2018-09-16 DIAGNOSIS — R441 Visual hallucinations: Secondary | ICD-10-CM

## 2018-09-16 LAB — CBC
HCT: 24.4 % — ABNORMAL LOW (ref 36.0–46.0)
Hemoglobin: 8 g/dL — ABNORMAL LOW (ref 12.0–15.0)
MCH: 33.6 pg (ref 26.0–34.0)
MCHC: 32.8 g/dL (ref 30.0–36.0)
MCV: 102.5 fL — ABNORMAL HIGH (ref 80.0–100.0)
Platelets: 183 10*3/uL (ref 150–400)
RBC: 2.38 MIL/uL — ABNORMAL LOW (ref 3.87–5.11)
RDW: 16.9 % — ABNORMAL HIGH (ref 11.5–15.5)
WBC: 9.3 10*3/uL (ref 4.0–10.5)
nRBC: 0 % (ref 0.0–0.2)

## 2018-09-16 LAB — OSMOLALITY, URINE: Osmolality, Ur: 225 mOsm/kg — ABNORMAL LOW (ref 300–900)

## 2018-09-16 LAB — SODIUM, URINE, RANDOM: Sodium, Ur: <10 mmol/L

## 2018-09-16 LAB — COMPREHENSIVE METABOLIC PANEL
ALT: 39 U/L (ref 0–44)
AST: 124 U/L — ABNORMAL HIGH (ref 15–41)
Albumin: 2.2 g/dL — ABNORMAL LOW (ref 3.5–5.0)
Alkaline Phosphatase: 149 U/L — ABNORMAL HIGH (ref 38–126)
Anion gap: 9 (ref 5–15)
BUN: 5 mg/dL — ABNORMAL LOW (ref 6–20)
CO2: 35 mmol/L — ABNORMAL HIGH (ref 22–32)
Calcium: 7.9 mg/dL — ABNORMAL LOW (ref 8.9–10.3)
Chloride: 90 mmol/L — ABNORMAL LOW (ref 98–111)
Creatinine, Ser: 0.61 mg/dL (ref 0.44–1.00)
GFR calc Af Amer: >60 mL/min (ref >60)
GFR calc non Af Amer: >60 mL/min (ref >60)
Glucose, Bld: 77 mg/dL (ref 70–99)
Potassium: 2.6 mmol/L — CL (ref 3.5–5.1)
Sodium: 134 mmol/L — ABNORMAL LOW (ref 135–145)
Total Bilirubin: 0.8 mg/dL (ref 0.3–1.2)
Total Protein: 5.3 g/dL — ABNORMAL LOW (ref 6.5–8.1)

## 2018-09-16 LAB — URINALYSIS, ROUTINE W REFLEX MICROSCOPIC
Bilirubin Urine: NEGATIVE
Glucose, UA: NEGATIVE mg/dL
Hgb urine dipstick: NEGATIVE
Ketones, ur: NEGATIVE mg/dL
Leukocytes,Ua: NEGATIVE
Nitrite: NEGATIVE
Protein, ur: NEGATIVE mg/dL
Specific Gravity, Urine: 1.011 (ref 1.005–1.030)
pH: 6 (ref 5.0–8.0)

## 2018-09-16 LAB — HIV ANTIBODY (ROUTINE TESTING W REFLEX): HIV Screen 4th Generation wRfx: NONREACTIVE

## 2018-09-16 LAB — FOLATE RBC
Folate, Hemolysate: 257 ng/mL
Folate, RBC: 973 ng/mL (ref >498)
Hematocrit: 26.4 % — ABNORMAL LOW (ref 34.0–46.6)

## 2018-09-16 LAB — PROTIME-INR
INR: 0.9 (ref 0.8–1.2)
Prothrombin Time: 12.1 seconds (ref 11.4–15.2)

## 2018-09-16 LAB — HEPATITIS PANEL, ACUTE
HCV Ab: <0.1 s/co ratio (ref 0.0–0.9)
Hep A IgM: NEGATIVE
Hep B C IgM: NEGATIVE
Hepatitis B Surface Ag: NEGATIVE

## 2018-09-16 LAB — NOVEL CORONAVIRUS, NAA (HOSP ORDER, SEND-OUT TO REF LAB; TAT 18-24 HRS): SARS-CoV-2, NAA: NOT DETECTED

## 2018-09-16 LAB — MAGNESIUM: Magnesium: 2.9 mg/dL — ABNORMAL HIGH (ref 1.7–2.4)

## 2018-09-16 MED ORDER — LORAZEPAM 2 MG/ML IJ SOLN: 0.0000 mg | Freq: Two times a day (BID) | INTRAMUSCULAR | Status: DC

## 2018-09-16 MED ORDER — LORAZEPAM 2 MG/ML IJ SOLN
0.0000 mg | Freq: Four times a day (QID) | INTRAMUSCULAR | Status: DC
  Administered 2018-09-16: 2 mg via INTRAVENOUS
  Filled 2018-09-16: qty 2

## 2018-09-16 MED ORDER — GABAPENTIN 400 MG PO CAPS
400.0000 mg | ORAL_CAPSULE | Freq: Once | ORAL | Status: AC
  Administered 2018-09-16: 400 mg via ORAL
  Filled 2018-09-16: qty 1

## 2018-09-16 MED ORDER — LORAZEPAM 2 MG/ML IJ SOLN
2.0000 mg | Freq: Once | INTRAMUSCULAR | Status: AC
  Administered 2018-09-16: 2 mg via INTRAVENOUS
  Filled 2018-09-16: qty 1

## 2018-09-16 NOTE — Progress Notes (Signed)
CRITICAL VALUE ALERT  Critical Value:  Potassium 2.6  Date & Time Notied:  09/16/18 @ 0550  Provider Notified: Lorra Hals NP   Orders Received/Actions taken: No new orders received. Will continue to monitor

## 2018-09-16 NOTE — Progress Notes (Signed)
Pt has become increasingly confused throughout the afternoon. MD made aware. IV Ativan given without change. Pt is hallucinating seeing people in her room that are not there, pulling at tele wires, attempting to get OOB. Moderate tremors noted in upper extremities. Charge nurse made aware. Will continue to monitor.

## 2018-09-16 NOTE — Progress Notes (Signed)
[facility] PROGRESS NOTE  Vaeda Westall WUX:[id number] DOB: [birth year] DOA: 09/15/2018 PCP: Patient, No Pcp Per  Assessment/Plan: Alcohol abuse with symptoms of active alcohol withdrawal.  Patient admits to drinking approximately 5 "mini bottles" of vodka daily, currently having visual hallucinations, tremors, tachycardia.  Will add scheduled IV Ativan in addition to as needed and follow CIWA protocol, continue multivitamin, folic acid, thiamine  Lower leg weakness secondary to severe hypokalemia.  Only minimal improvement in potassium  (2--2.5) status post 120 mEq of potassium in 24 hours, still requires inpatient admission for continued aggressive repletion, mag within normal limits, repeat in am  Normocytic/macrocytic anemia, unclear acuity.  No active bleeding, hemoglobin range 8-9, panel seems consistent with anemia of chronic disease within normal limits, daily CBC  Elevated AST, consistent with alcoholic hepatitis.  Mild abdominal tenderness on exam.  Slight elevation in alk phos, bilirubin within normal limits.  Continue to trend LFTs  Hyponatremia suspect related to poor solute intake related to chronic alcoholism, improving.  129-134 follow-up serum/urine sodium/osmolality, daily CMP  High anion gap metabolic acidosis, resolved.  Related to alcoholism.  Responded well to hydration.  Spent greater than 35 minutes in care for patient Code Status: FULL CODE Family Communication: no family at bedside (indicate person spoken with, relationship, and if by phone, the number) Disposition Plan: status post 120 mEq of potassium in 24 hours, still requires inpatient admission for continued aggressive repletion, needs close intensive monitoring for active alcohol withdrawal   Consultants:  None  Procedures:  None  Antibiotics:  None (indicate start date, and stop date if known)  HPI/Subjective:  Jennea Rager is a 33 y.o. year old female with medical history significant for  chronic alcoholism and alcohol abuse who presented on 09/15/2018 with reported bilateral leg weakness x1 week and fall.  Per EMS was found to have blood pressure 96/60 requiring fluid boluses in the ED.  Patient admitted for severe hypokalemia causing lower extremity weakness and now being managed for active alcohol withdrawal.   "I was creeped out by the man standing in the corner" Misty drinking 5 "mini bottle" of vodka daily Denies any history of active alcohol withdrawal  Objective: Vitals:   09/16/18 0603 09/16/18 1403  BP: 118/78   Pulse: 95   Resp: 18 20  Temp: 98 F (36.7 C) 98.2 F (36.8 C)  SpO2: 92% 97%    Intake/Output Summary (Last 24 hours) at 09/16/2018 1828 Last data filed at 09/16/2018 1300 Gross per 24 hour  Intake 840 ml  Output 800 ml  Net 40 ml   Filed Weights   09/15/18 1121  Weight: 56.7 kg    Exam:   General: Middle-aged female, looks older than stated age, no acute distress  Cardiovascular: Tachycardic, no edema  Respiratory: Normal respiratory effort on room air  Abdomen: Soft, nondistended, mildly tender with deep palpation, normal bowel sounds  Musculoskeletal: Normal range of motion,  Skin dry, intact  Neurologic tremors present, able to lift legs against gravity and some resistance bilaterally, no focal deficits  Psych: Visual hallucinations (seeing man in the corner", tactile hallucinations (feels like her bra is on her)  Data Reviewed: Basic Metabolic Panel: Recent Labs  Lab 09/15/18 1238 09/15/18 1733 09/16/18 0445  NA 129*  --  134*  K <2.0*  --  2.6*  CL 71*  --  90*  CO2 38*  --  35*  GLUCOSE 84  --  77  BUN <5*  --  5*  CREATININE 0.69 0.79  0.61  CALCIUM 8.5*  --  7.9*  MG 1.5*  --  2.9*   Liver Function Tests: Recent Labs  Lab 09/15/18 1238 09/16/18 0445  AST 142* 124*  ALT 46* 39  ALKPHOS 182* 149*  BILITOT 0.8 0.8  PROT 6.6 5.3*  ALBUMIN 2.8* 2.2*   Recent Labs  Lab 09/15/18 1238  LIPASE 67*   No  results for input(s): AMMONIA in the last 168 hours. CBC: Recent Labs  Lab 09/15/18 1238 09/15/18 1733 09/16/18 0445  WBC 11.6* 12.0* 9.3  HGB 9.2* 9.1* 8.0*  HCT 27.1* 26.4*  26.4* 24.4*  MCV 96.4 97.1 102.5*  PLT 222 206 183   Cardiac Enzymes: No results for input(s): CKTOTAL, CKMB, CKMBINDEX, TROPONINI in the last 168 hours. BNP (last 3 results) No results for input(s): BNP in the last 8760 hours.  ProBNP (last 3 results) No results for input(s): PROBNP in the last 8760 hours.  CBG: No results for input(s): GLUCAP in the last 168 hours.  Recent Results (from the past 240 hour(s))  Novel Coronavirus,NAA,(SEND-OUT TO REF LAB - TAT 24-48 hrs); Hosp Order     Status: None   Collection Time: 09/15/18 12:07 PM   Specimen: Nasopharyngeal Swab; Respiratory  Result Value Ref Range Status   SARS-CoV-2, NAA NOT DETECTED NOT DETECTED Final    Comment: (NOTE) This test was developed and its performance characteristics determined by [employer]. This test has not been FDA cleared or approved. This test has been authorized by FDA under an Emergency Use Authorization (EUA). This test is only authorized for the duration of time the declaration that circumstances exist justifying the authorization of the emergency use of in vitro diagnostic tests for detection of SARS-CoV-2 virus and/or diagnosis of COVID-19 infection under section 564(b)(1) of the Act, 21 U.S.C. 811XBW-6(O)(0), unless the authorization is terminated or revoked sooner. When diagnostic testing is negative, the possibility of a false negative result should be considered in the context of a patient's recent exposures and the presence of clinical signs and symptoms consistent with COVID-19. An individual without symptoms of COVID-19 and who is not shedding SARS-CoV-2 virus would expect to have a negative (not detected) result in this assay. Performed  At: [facility] [place], [place]  [id number] Rush Farmer MD AG:[id number]    [place]  Final    Comment: Performed at [place] [address] [place], [place] [address]     Studies: No results found.  Scheduled Meds: . enoxaparin (LOVENOX) injection  40 mg Subcutaneous Q24H  . folic acid  1 mg Oral Daily  . multivitamin with minerals  1 tablet Oral Daily  . thiamine  100 mg Oral Daily   Continuous Infusions:  Active Problems:   Acute hypokalemia   Alcohol intoxication ([place])   Alcohol dependence ([place])   Hypomagnesemia   High anion gap metabolic acidosis   Alcoholic liver disease ([place])   Hypokalemia      [employer] D Gage Weant  [facility]

## 2018-09-17 ENCOUNTER — Inpatient Hospital Stay (HOSPITAL_COMMUNITY): Payer: Medicaid - Out of State

## 2018-09-17 DIAGNOSIS — R1084 Generalized abdominal pain: Secondary | ICD-10-CM

## 2018-09-17 DIAGNOSIS — F1010 Alcohol abuse, uncomplicated: Secondary | ICD-10-CM

## 2018-09-17 LAB — CBC
HCT: 26.8 % — ABNORMAL LOW (ref 36.0–46.0)
Hemoglobin: 8.6 g/dL — ABNORMAL LOW (ref 12.0–15.0)
MCH: 33.9 pg (ref 26.0–34.0)
MCHC: 32.1 g/dL (ref 30.0–36.0)
MCV: 105.5 fL — ABNORMAL HIGH (ref 80.0–100.0)
Platelets: 211 10*3/uL (ref 150–400)
RBC: 2.54 MIL/uL — ABNORMAL LOW (ref 3.87–5.11)
RDW: 17.3 % — ABNORMAL HIGH (ref 11.5–15.5)
WBC: 11.5 10*3/uL — ABNORMAL HIGH (ref 4.0–10.5)
nRBC: 0 % (ref 0.0–0.2)

## 2018-09-17 LAB — COMPREHENSIVE METABOLIC PANEL
ALT: 36 U/L (ref 0–44)
AST: 119 U/L — ABNORMAL HIGH (ref 15–41)
Albumin: 2.3 g/dL — ABNORMAL LOW (ref 3.5–5.0)
Alkaline Phosphatase: 145 U/L — ABNORMAL HIGH (ref 38–126)
Anion gap: 10 (ref 5–15)
BUN: 6 mg/dL (ref 6–20)
CO2: 30 mmol/L (ref 22–32)
Calcium: 8.4 mg/dL — ABNORMAL LOW (ref 8.9–10.3)
Chloride: 97 mmol/L — ABNORMAL LOW (ref 98–111)
Creatinine, Ser: 0.51 mg/dL (ref 0.44–1.00)
GFR calc Af Amer: >60 mL/min (ref >60)
GFR calc non Af Amer: >60 mL/min (ref >60)
Glucose, Bld: 99 mg/dL (ref 70–99)
Potassium: 4 mmol/L (ref 3.5–5.1)
Sodium: 137 mmol/L (ref 135–145)
Total Bilirubin: 1.2 mg/dL (ref 0.3–1.2)
Total Protein: 5.4 g/dL — ABNORMAL LOW (ref 6.5–8.1)

## 2018-09-17 LAB — AMMONIA: Ammonia: 29 umol/L (ref 9–35)

## 2018-09-17 LAB — MAGNESIUM: Magnesium: 2.2 mg/dL (ref 1.7–2.4)

## 2018-09-17 NOTE — Progress Notes (Signed)
Patient calm and comfortable resting in the bed.  No longer attempting to get out of bed or pull lines/drains.  Sitter discontinued.  AC and CN made aware.

## 2018-09-17 NOTE — Progress Notes (Signed)
[facility] PROGRESS NOTE  Donna Patterson LEX:[id number] DOB: 1985/07/02 DOA: 09/15/2018 PCP: Patient, No Pcp Per  Assessment/Plan: Alcohol abuse with symptoms of active alcohol withdrawal.  Patient admits to drinking approximately 5 "mini bottles" of vodka daily, currently having visual hallucinations, tremors, tachycardia.  Given earlier somnolence will discontinue scheduled Ativan and continue CIWA protocol with as needed Ativan, patient needs continued inpatient admission given active alcohol withdrawal and unsafe for discharge given waxing and waning mental status,  continue multivitamin, folic acid, thiamine  Encephalopathy Oriented to self, place unaware of context, very tremulous, no obvious asterixis but not following exam consistently, likely related to active withdrawal.  Only within normal limits, abdominal x-ray unremarkable, abdominal ultrasound shows hepatic steatosis.  Trend CMP  Lower leg weakness secondary to severe hypokalemia.  Finally corrected with aggressive IV/oral repletion.  PT and OT consulted  Normocytic/macrocytic anemia, unclear acuity.  No active bleeding, hemoglobin range 8-9, panel seems consistent with anemia of chronic disease within normal limits, daily CBC  Elevated AST, consistent with alcoholic hepatitis.  Mild abdominal tenderness on exam f/u abd imaging  Slight elevation in alk phos, bilirubin within normal limits.  Continue to trend LFTs  Hyponatremia suspect related to poor solute intake related to chronic alcoholism, improving.  129-134 follow-up serum/urine sodium/osmolality, daily CMP  High anion gap metabolic acidosis, resolved.  Related to alcoholism.  Responded well to hydration.   Code Status: FULL CODE Family Communication: no family at bedside (indicate person spoken with, relationship, and if by phone, the number) Disposition Plan: , needs close intensive monitoring for active alcohol withdrawal, monitoring of mental  status Consultants:  None  Procedures:  None  Antibiotics:  None (indicate start date, and stop date if known)  HPI/Subjective:  Donna Patterson is a 33 y.o. year old female with medical history significant for chronic alcoholism and alcohol abuse who presented on 09/15/2018 with reported bilateral leg weakness x1 week and fall.  Per EMS was found to have blood pressure 96/60 requiring fluid boluses in the ED.  Patient admitted for severe hypokalemia causing lower extremity weakness and now being managed for active alcohol withdrawal.   No acute complaints No acute events overnight Very sleepy today  Objective: Vitals:   09/16/18 2312 09/17/18 0620  BP: (!) 150/134 (!) 146/111  Pulse: (!) 106 (!) 111  Resp:  16  Temp:  98.3 F (36.8 C)  SpO2:  95%    Intake/Output Summary (Last 24 hours) at 09/17/2018 0751 Last data filed at 09/16/2018 1800 Gross per 24 hour  Intake 480 ml  Output 800 ml  Net -320 ml   Filed Weights   09/15/18 1121  Weight: 56.7 kg    Exam:   General: Middle-aged female, looks older than stated age, no acute distress  Cardiovascular: Tachycardic, no edema  Respiratory: Normal respiratory effort on room air  Abdomen: Soft, distended, mildly tender with deep palpation, normal bowel sounds  Musculoskeletal: Normal range of motion,  Skin dry, intact  Neurologic tremors present, able to lift legs against gravity and some resistance bilaterally, no focal deficits  Psych: no current active visual/auditory/tactile hallucinations  Data Reviewed: Basic Metabolic Panel: Recent Labs  Lab 09/15/18 1238 09/15/18 1733 09/16/18 0445  NA 129*  --  134*  K <2.0*  --  2.6*  CL 71*  --  90*  CO2 38*  --  35*  GLUCOSE 84  --  77  BUN <5*  --  5*  CREATININE 0.69 0.79 0.61  CALCIUM 8.5*  --  7.9*  MG 1.5*  --  2.9*   Liver Function Tests: Recent Labs  Lab 09/15/18 1238 09/16/18 0445  AST 142* 124*  ALT 46* 39  ALKPHOS 182* 149*  BILITOT 0.8  0.8  PROT 6.6 5.3*  ALBUMIN 2.8* 2.2*   Recent Labs  Lab 09/15/18 1238  LIPASE 67*   No results for input(s): AMMONIA in the last 168 hours. CBC: Recent Labs  Lab 09/15/18 1238 09/15/18 1733 09/16/18 0445  WBC 11.6* 12.0* 9.3  HGB 9.2* 9.1* 8.0*  HCT 27.1* 26.4*  26.4* 24.4*  MCV 96.4 97.1 102.5*  PLT 222 206 183   Cardiac Enzymes: No results for input(s): CKTOTAL, CKMB, CKMBINDEX, TROPONINI in the last 168 hours. BNP (last 3 results) No results for input(s): BNP in the last 8760 hours.  ProBNP (last 3 results) No results for input(s): PROBNP in the last 8760 hours.  CBG: No results for input(s): GLUCAP in the last 168 hours.  Recent Results (from the past 240 hour(s))  Novel Coronavirus,NAA,(SEND-OUT TO REF LAB - TAT 24-48 hrs); Hosp Order     Status: None   Collection Time: 09/15/18 12:07 PM   Specimen: Nasopharyngeal Swab; Respiratory  Result Value Ref Range Status   SARS-CoV-2, NAA NOT DETECTED NOT DETECTED Final    Comment: (NOTE) This test was developed and its performance characteristics determined by [employer]. This test has not been FDA cleared or approved. This test has been authorized by FDA under an Emergency Use Authorization (EUA). This test is only authorized for the duration of time the declaration that circumstances exist justifying the authorization of the emergency use of in vitro diagnostic tests for detection of SARS-CoV-2 virus and/or diagnosis of COVID-19 infection under section 564(b)(1) of the Act, 21 U.S.C. 102TRZ-7(B)(5), unless the authorization is terminated or revoked sooner. When diagnostic testing is negative, the possibility of a false negative result should be considered in the context of a patient's recent exposures and the presence of clinical signs and symptoms consistent with COVID-19. An individual without symptoms of COVID-19 and who is not shedding SARS-CoV-2 virus would expect to have a negative (not detected)  result in this assay. Performed  At: [facility] [place], [place] [id number] Rush Farmer MD DT:[id number]    [place]  Final    Comment: Performed at [place] [address], [place], [place] [address]     Studies: No results found.  Scheduled Meds: . enoxaparin (LOVENOX) injection  40 mg Subcutaneous Q24H  . folic acid  1 mg Oral Daily  . multivitamin with minerals  1 tablet Oral Daily  . thiamine  100 mg Oral Daily   Continuous Infusions:  Active Problems:   Acute hypokalemia   Alcohol intoxication ([place])   Alcohol dependence with uncomplicated withdrawal ([facility])   Hypomagnesemia   High anion gap metabolic acidosis   Alcoholic liver disease ([place])   Hypokalemia   Visual hallucinations   Anemia      Shayla D Nettey  [facility]

## 2018-09-18 LAB — CBC
HCT: 25.8 % — ABNORMAL LOW (ref 36.0–46.0)
Hemoglobin: 8.5 g/dL — ABNORMAL LOW (ref 12.0–15.0)
MCH: 34.6 pg — ABNORMAL HIGH (ref 26.0–34.0)
MCHC: 32.9 g/dL (ref 30.0–36.0)
MCV: 104.9 fL — ABNORMAL HIGH (ref 80.0–100.0)
Platelets: 296 10*3/uL (ref 150–400)
RBC: 2.46 MIL/uL — ABNORMAL LOW (ref 3.87–5.11)
RDW: 18.4 % — ABNORMAL HIGH (ref 11.5–15.5)
WBC: 12.1 10*3/uL — ABNORMAL HIGH (ref 4.0–10.5)
nRBC: 0 % (ref 0.0–0.2)

## 2018-09-18 LAB — MAGNESIUM: Magnesium: 1.7 mg/dL (ref 1.7–2.4)

## 2018-09-18 MED ORDER — LORAZEPAM 2 MG/ML IJ SOLN: 0.0000 mg | Freq: Two times a day (BID) | INTRAMUSCULAR | Status: AC

## 2018-09-18 MED ORDER — LORAZEPAM 2 MG/ML IJ SOLN
0.0000 mg | Freq: Four times a day (QID) | INTRAMUSCULAR | Status: AC
  Administered 2018-09-18 – 2018-09-19 (×6): 2 mg via INTRAVENOUS
  Filled 2018-09-18 (×10): qty 1

## 2018-09-18 NOTE — Progress Notes (Signed)
PROGRESS NOTE  Donna BaneHeather Patterson RUE:[id number] DOB: 13-Sep-1985 DOA: 09/15/2018 PCP: Patient, No Pcp Per  HPI/Recap of past 24 hours:  Donna BaneHeather Patterson is a 33 y.o. year old female with medical history significant for chronic alcoholism and alcohol abuse who presented on 09/15/2018 with reported bilateral leg weakness x1 week and fall.  Per EMS was found to have blood pressure 96/60 requiring fluid boluses in the ED.  Patient admitted for severe hypokalemia causing lower extremity weakness and now being managed for active alcohol withdrawal.   No events overnight.  Pt still shaky, tachycardic.  She seems to be a little forgetful as to why she is here.  No focal complaints although she still complains of leg pain and weakness.  Assessment/Plan: Active Problems: Bilateral lower extremity weakness secondary to acute hypokalemia: Since stabilized.  Recheck in the morning   Alcohol intoxication (HCC) with uncomplicated withdrawal: Continue DTs, have added scheduled Ativan   Alcohol dependence with uncomplicated withdrawal (HCC)    High anion gap metabolic acidosis: Resolved, secondary to alcoholic ketosis   Alcoholic liver disease (HCC)    Visual hallucinations: To have resolved   Anemia Leukocytosis, may be stress margination.  We will continue to follow.  Code Status: Full code  Family Communication: Left message for family  Disposition Plan: Discharge next few days once ambulatory status stable and she is through withdrawals   Consultants:  None  Procedures:  None  Antimicrobials:  None  DVT prophylaxis: Lovenox   Objective: Vitals:   09/18/18 0606 09/18/18 1300  BP: (!) 138/94 131/89  Pulse: (!) 114 95  Resp: 20 18  Temp: 98.6 F (37 C) 98.7 F (37.1 C)  SpO2: 95% 97%    Intake/Output Summary (Last 24 hours) at 09/18/2018 1754 Last data filed at 09/18/2018 1300 Gross per 24 hour  Intake 360 ml  Output 200 ml  Net 160 ml   Filed Weights   09/15/18 1121 09/18/18  0500  Weight: 56.7 kg 62.4 kg   Body mass index is 23.61 kg/m.  Exam:   General: Alert and oriented x2, no acute distress  HEENT: Normocephalic, atraumatic, mucous membranes slightly dry  Neck: Supple, no JVD  Cardiovascular: Regular rhythm, tachycardic  Respiratory: Clear to auscultation bilaterally  Abdomen: Soft, nontender, nondistended, positive bowel sounds  Musculoskeletal: No clubbing or cyanosis or edema  Skin: No skin breaks, tears or lesions  Psychiatry: Easily distracted, jittery   Data Reviewed: CBC: Recent Labs  Lab 09/15/18 1238 09/15/18 1733 09/16/18 0445 09/17/18 0907 09/18/18 0400  WBC 11.6* 12.0* 9.3 11.5* 12.1*  HGB 9.2* 9.1* 8.0* 8.6* 8.5*  HCT 27.1* 26.4*  26.4* 24.4* 26.8* 25.8*  MCV 96.4 97.1 102.5* 105.5* 104.9*  PLT 222 206 183 211 296   Basic Metabolic Panel: Recent Labs  Lab 09/15/18 1238 09/15/18 1733 09/16/18 0445 09/17/18 0907 09/18/18 0400  NA 129*  --  134* 137  --   K <2.0*  --  2.6* 4.0  --   CL 71*  --  90* 97*  --   CO2 38*  --  35* 30  --   GLUCOSE 84  --  77 99  --   BUN <5*  --  5* 6  --   CREATININE 0.69 0.79 0.61 0.51  --   CALCIUM 8.5*  --  7.9* 8.4*  --   MG 1.5*  --  2.9* 2.2 1.7   GFR: Estimated Creatinine Clearance: 85.6 mL/min (by C-G formula based on SCr of 0.51  mg/dL). Liver Function Tests: Recent Labs  Lab 09/15/18 1238 09/16/18 0445 09/17/18 0907  AST 142* 124* 119*  ALT 46* 39 36  ALKPHOS 182* 149* 145*  BILITOT 0.8 0.8 1.2  PROT 6.6 5.3* 5.4*  ALBUMIN 2.8* 2.2* 2.3*   Recent Labs  Lab 09/15/18 1238  LIPASE 67*   Recent Labs  Lab 09/17/18 0907  AMMONIA 29   Coagulation Profile: Recent Labs  Lab 09/16/18 0445  INR 0.9   Cardiac Enzymes: No results for input(s): CKTOTAL, CKMB, CKMBINDEX, TROPONINI in the last 168 hours. BNP (last 3 results) No results for input(s): PROBNP in the last 8760 hours. HbA1C: No results for input(s): HGBA1C in the last 72 hours. CBG: No  results for input(s): GLUCAP in the last 168 hours. Lipid Profile: No results for input(s): CHOL, HDL, LDLCALC, TRIG, CHOLHDL, LDLDIRECT in the last 72 hours. Thyroid Function Tests: No results for input(s): TSH, T4TOTAL, FREET4, T3FREE, THYROIDAB in the last 72 hours. Anemia Panel: No results for input(s): VITAMINB12, FOLATE, FERRITIN, TIBC, IRON, RETICCTPCT in the last 72 hours. Urine analysis:    Component Value Date/Time   COLORURINE YELLOW 09/16/2018 0134   APPEARANCEUR CLEAR 09/16/2018 0134   LABSPEC 1.011 09/16/2018 0134   PHURINE 6.0 09/16/2018 0134   GLUCOSEU NEGATIVE 09/16/2018 0134   HGBUR NEGATIVE 09/16/2018 0134   BILIRUBINUR NEGATIVE 09/16/2018 0134   KETONESUR NEGATIVE 09/16/2018 0134   PROTEINUR NEGATIVE 09/16/2018 0134   NITRITE NEGATIVE 09/16/2018 0134   LEUKOCYTESUR NEGATIVE 09/16/2018 0134   Sepsis Labs: @LABRCNTIP (procalcitonin:4,lacticidven:4)  ) Recent Results (from the past 240 hour(s))  Novel Coronavirus,NAA,(SEND-OUT TO REF LAB - TAT 24-48 hrs); Hosp Order     Status: None   Collection Time: 09/15/18 12:07 PM   Specimen: Nasopharyngeal Swab; Respiratory  Result Value Ref Range Status   SARS-CoV-2, NAA NOT DETECTED NOT DETECTED Final    Comment: (NOTE) This test was developed and its performance characteristics determined by [facility]. This test has not been FDA cleared or approved. This test has been authorized by FDA under an Emergency Use Authorization (EUA). This test is only authorized for the duration of time the declaration that circumstances exist justifying the authorization of the emergency use of in vitro diagnostic tests for detection of SARS-CoV-2 virus and/or diagnosis of COVID-19 infection under section 564(b)(1) of the Act, 21 U.S.C. 161WRU-0(A)(5360bbb-3(b)(1), unless the authorization is terminated or revoked sooner. When diagnostic testing is negative, the possibility of a false negative result should be considered in the context of  a patient's recent exposures and the presence of clinical signs and symptoms consistent with COVID-19. An individual without symptoms of COVID-19 and who is not shedding SARS-CoV-2 virus would expect to have a negative (not detected) result in this assay. Performed  At: [facility] [place] [address] [place], [place] [id number] Jolene SchimkeNagendra Sanjai MD NW:[id number]:[phone]    Coronavirus Source NASOPHARYNGEAL  Final    Comment: Performed at [facility], [address] [place], [place] [address]      Studies: No results found.  Scheduled Meds: . enoxaparin (LOVENOX) injection  40 mg Subcutaneous Q24H  . folic acid  1 mg Oral Daily  . LORazepam  0-4 mg Intravenous Q6H   Followed by  . [START ON 09/20/2018] LORazepam  0-4 mg Intravenous Q12H  . multivitamin with minerals  1 tablet Oral Daily  . thiamine  100 mg Oral Daily    Continuous Infusions:   LOS: 3 days     Hollice EspySendil K Hermann Dottavio,  MD [facility]  To reach me or the doctor on call, go to: [email or web address] Password [facility]  09/18/2018, 5:54 PM

## 2018-09-19 ENCOUNTER — Inpatient Hospital Stay (HOSPITAL_COMMUNITY): Payer: Medicaid - Out of State

## 2018-09-19 LAB — CBC
HCT: 28.9 % — ABNORMAL LOW (ref 36.0–46.0)
Hemoglobin: 9 g/dL — ABNORMAL LOW (ref 12.0–15.0)
MCH: 33 pg (ref 26.0–34.0)
MCHC: 31.1 g/dL (ref 30.0–36.0)
MCV: 105.9 fL — ABNORMAL HIGH (ref 80.0–100.0)
Platelets: 282 10*3/uL (ref 150–400)
RBC: 2.73 MIL/uL — ABNORMAL LOW (ref 3.87–5.11)
RDW: 18.3 % — ABNORMAL HIGH (ref 11.5–15.5)
WBC: 12.5 10*3/uL — ABNORMAL HIGH (ref 4.0–10.5)
nRBC: 0 % (ref 0.0–0.2)

## 2018-09-19 LAB — COMPREHENSIVE METABOLIC PANEL
ALT: 37 U/L (ref 0–44)
AST: 132 U/L — ABNORMAL HIGH (ref 15–41)
Albumin: 2.7 g/dL — ABNORMAL LOW (ref 3.5–5.0)
Alkaline Phosphatase: 145 U/L — ABNORMAL HIGH (ref 38–126)
Anion gap: 11 (ref 5–15)
BUN: 12 mg/dL (ref 6–20)
CO2: 25 mmol/L (ref 22–32)
Calcium: 9 mg/dL (ref 8.9–10.3)
Chloride: 100 mmol/L (ref 98–111)
Creatinine, Ser: 0.46 mg/dL (ref 0.44–1.00)
GFR calc Af Amer: >60 mL/min (ref >60)
GFR calc non Af Amer: >60 mL/min (ref >60)
Glucose, Bld: 78 mg/dL (ref 70–99)
Potassium: 4.8 mmol/L (ref 3.5–5.1)
Sodium: 136 mmol/L (ref 135–145)
Total Bilirubin: 1.2 mg/dL (ref 0.3–1.2)
Total Protein: 6.4 g/dL — ABNORMAL LOW (ref 6.5–8.1)

## 2018-09-19 LAB — PROCALCITONIN: Procalcitonin: 0.44 ng/mL

## 2018-09-19 LAB — MAGNESIUM: Magnesium: 1.8 mg/dL (ref 1.7–2.4)

## 2018-09-19 MED ORDER — NICOTINE 21 MG/24HR TD PT24
21.0000 mg | MEDICATED_PATCH | Freq: Every day | TRANSDERMAL | Status: DC
  Administered 2018-09-19 – 2018-09-22 (×4): 21 mg via TRANSDERMAL
  Filled 2018-09-19 (×4): qty 1

## 2018-09-19 NOTE — Progress Notes (Signed)
PROGRESS NOTE  Donna BaneHeather Patterson ZOX:[id number] DOB: 03/15/1986 DOA: 09/15/2018 PCP: Patient, No Pcp Per  HPI/Recap of past 24 hours:  Donna BaneHeather Patterson is a 33 y.o. year old female with medical history significant for chronic alcoholism and alcohol abuse who presented on 09/15/2018 with reported bilateral leg weakness x1 week and fall.  Per EMS was found to have blood pressure 96/60 requiring fluid boluses in the ED.  Patient admitted for severe hypokalemia causing lower extremity weakness and now being managed for active alcohol withdrawal.   Patient with slow improvement.  Seen by physical and Occupational Therapy recommend either skilled nursing or home with home health and 24-hour supervision.  Patient lives the boyfriend who is not able to provide that.  She still somewhat shaky and tachycardic although a little bit improved.  Still complains of lower extremity pain and weakness.  White count has been slowly increasing although she has had no fever.  Procalcitonin level mildly elevated at 0.44  Assessment/Plan: Active Problems: Bilateral lower extremity weakness secondary to acute hypokalemia: Potassium levels normalized with replacement.  Seen by physical and Occupational Therapy are recommending skilled nursing versus home health with 24-hour supervision.    Alcohol intoxication (HCC) with uncomplicated withdrawal: Still quite shaky and tremulous.  Added scheduled Ativan to as needed.   Alcohol dependence with uncomplicated withdrawal (HCC)    High anion gap metabolic acidosis: Resolved, secondary to alcoholic ketosis   Alcoholic liver disease (HCC)  ?  Infection: Oxygen saturations overall stable.  Urinalysis unrevealing.  However procalcitonin level of 0.44 and white count has been slowly increasing on a daily basis.  Checking portable chest x-ray    Visual hallucinations: To have resolved   Anemia Leukocytosis, may be stress margination.  We will continue to follow.  Code Status: Full  code  Family Communication: Left message for boyfriend on phone  Disposition Plan: Discharge next few days once infection ruled out, much more stabilized on Ativan protocol and decision made where she will go from here   Consultants:  None  Procedures:  None  Antimicrobials:  None  DVT prophylaxis: Lovenox   Objective: Vitals:   09/19/18 0945 09/19/18 1430  BP: (!) 141/106 (!) 137/95  Pulse: (!) 107 95  Resp: (!) 24 (!) 23  Temp: 99.3 F (37.4 C) 98.7 F (37.1 C)  SpO2: 94% 97%    Intake/Output Summary (Last 24 hours) at 09/19/2018 1529 Last data filed at 09/19/2018 0700 Gross per 24 hour  Intake --  Output 200 ml  Net -200 ml   Filed Weights   09/15/18 1121 09/18/18 0500  Weight: 56.7 kg 62.4 kg   Body mass index is 23.61 kg/m.  Exam:   General: Alert and oriented x2, no acute distress  HEENT: Normocephalic, atraumatic, mucous membranes slightly dry  Neck: Supple, no JVD  Cardiovascular: Regular rhythm, tachycardic  Respiratory: Clear to auscultation bilaterally  Abdomen: Soft, nontender, nondistended, positive bowel sounds  Musculoskeletal: No clubbing or cyanosis or edema  Skin: No skin breaks, tears or lesions  Psychiatry: Easily distracted, jittery   Data Reviewed: CBC: Recent Labs  Lab 09/15/18 1733 09/16/18 0445 09/17/18 0907 09/18/18 0400 09/19/18 0805  WBC 12.0* 9.3 11.5* 12.1* 12.5*  HGB 9.1* 8.0* 8.6* 8.5* 9.0*  HCT 26.4*   26.4* 24.4* 26.8* 25.8* 28.9*  MCV 97.1 102.5* 105.5* 104.9* 105.9*  PLT 206 183 211 296 282   Basic Metabolic Panel: Recent Labs  Lab 09/15/18 1238 09/15/18 1733 09/16/18 0445 09/17/18 0907 09/18/18 0400  09/19/18 0805  NA 129*  --  134* 137  --  136  K <2.0*  --  2.6* 4.0  --  4.8  CL 71*  --  90* 97*  --  100  CO2 38*  --  35* 30  --  25  GLUCOSE 84  --  77 99  --  78  BUN <5*  --  5* 6  --  12  CREATININE 0.69 0.79 0.61 0.51  --  0.46  CALCIUM 8.5*  --  7.9* 8.4*  --  9.0  MG 1.5*   --  2.9* 2.2 1.7 1.8   GFR: Estimated Creatinine Clearance: 85.6 mL/min (by C-G formula based on SCr of 0.46 mg/dL). Liver Function Tests: Recent Labs  Lab 09/15/18 1238 09/16/18 0445 09/17/18 0907 09/19/18 0805  AST 142* 124* 119* 132*  ALT 46* 39 36 37  ALKPHOS 182* 149* 145* 145*  BILITOT 0.8 0.8 1.2 1.2  PROT 6.6 5.3* 5.4* 6.4*  ALBUMIN 2.8* 2.2* 2.3* 2.7*   Recent Labs  Lab 09/15/18 1238  LIPASE 67*   Recent Labs  Lab 09/17/18 0907  AMMONIA 29   Coagulation Profile: Recent Labs  Lab 09/16/18 0445  INR 0.9   Cardiac Enzymes: No results for input(s): CKTOTAL, CKMB, CKMBINDEX, TROPONINI in the last 168 hours. BNP (last 3 results) No results for input(s): PROBNP in the last 8760 hours. HbA1C: No results for input(s): HGBA1C in the last 72 hours. CBG: No results for input(s): GLUCAP in the last 168 hours. Lipid Profile: No results for input(s): CHOL, HDL, LDLCALC, TRIG, CHOLHDL, LDLDIRECT in the last 72 hours. Thyroid Function Tests: No results for input(s): TSH, T4TOTAL, FREET4, T3FREE, THYROIDAB in the last 72 hours. Anemia Panel: No results for input(s): VITAMINB12, FOLATE, FERRITIN, TIBC, IRON, RETICCTPCT in the last 72 hours. Urine analysis:    Component Value Date/Time   COLORURINE YELLOW 09/16/2018 0134   APPEARANCEUR CLEAR 09/16/2018 0134   LABSPEC 1.011 09/16/2018 0134   PHURINE 6.0 09/16/2018 0134   GLUCOSEU NEGATIVE 09/16/2018 0134   HGBUR NEGATIVE 09/16/2018 0134   BILIRUBINUR NEGATIVE 09/16/2018 0134   KETONESUR NEGATIVE 09/16/2018 0134   PROTEINUR NEGATIVE 09/16/2018 0134   NITRITE NEGATIVE 09/16/2018 0134   LEUKOCYTESUR NEGATIVE 09/16/2018 0134   Sepsis Labs: @LABRCNTIP (procalcitonin:4,lacticidven:4)  ) Recent Results (from the past 240 hour(s))  Novel Coronavirus,NAA,(SEND-OUT TO REF LAB - TAT 24-48 hrs); Hosp Order     Status: None   Collection Time: 09/15/18 12:07 PM   Specimen: Nasopharyngeal Swab; Respiratory  Result Value Ref  Range Status   SARS-CoV-2, NAA NOT DETECTED NOT DETECTED Final    Comment: (NOTE) This test was developed and its performance characteristics determined by [facility]. This test has not been FDA cleared or approved. This test has been authorized by FDA under an Emergency Use Authorization (EUA). This test is only authorized for the duration of time the declaration that circumstances exist justifying the authorization of the emergency use of in vitro diagnostic tests for detection of SARS-CoV-2 virus and/or diagnosis of COVID-19 infection under section 564(b)(1) of the Act, 21 U.S.C. 914NWG-9(F)(6360bbb-3(b)(1), unless the authorization is terminated or revoked sooner. When diagnostic testing is negative, the possibility of a false negative result should be considered in the context of a patient's recent exposures and the presence of clinical signs and symptoms consistent with COVID-19. An individual without symptoms of COVID-19 and who is not shedding SARS-CoV-2 virus would expect to have a negative (not detected) result in this  assay. Performed  At: [facility] [place], [place] [id number] Rush Farmer MD TI:[id number]    [place]  Final    Comment: Performed at [facility] [address], [place], [place] [address]      Studies: No results found.  Scheduled Meds:  enoxaparin (LOVENOX) injection  40 mg Subcutaneous J09T   folic acid  1 mg Oral Daily   LORazepam  0-4 mg Intravenous Q6H   Followed by   Derrill Memo ON 09/20/2018] LORazepam  0-4 mg Intravenous Q12H   multivitamin with minerals  1 tablet Oral Daily   nicotine  21 mg Transdermal Daily   thiamine  100 mg Oral Daily    Continuous Infusions:   LOS: 4 days     Annita Brod, MD [facility]  To reach me or the doctor on call, go to: [email or web address] Password [facility]  09/19/2018, 3:29 PM

## 2018-09-19 NOTE — Evaluation (Signed)
Occupational Therapy Evaluation Patient Details Name: Donna BaneHeather Patterson MRN: [id number] DOB: 02-03-1986 Today's Date: 09/19/2018    History of Present Illness Pt is a 33 y.o. female with PMH significant for chronic alcoholism and alcohol abuse who presented on 09/15/2018 with BLE weakness x1 week with fall. Admitted with severe hypokalemia and being managed for alcohol withdrawal.    Clinical Impression   PTA, pt reports independence with ADL and functional mobility. 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 She presents with confusion, instability on her feet, BUE tremor and decreased coordination all impacting her daily participation in occupations. She requires moderate assistance for LB ADL and toilet transfers with RW. She was able to stand at the sink to complete grooming tasks with min assist but attempting to use face wash instead of toothpaste on her toothbrush. Pt would benefit from continued OT services while admitted to improve independence and safety with ADL and functional mobility. At current functional level pt will need 24 hour assistance at home and likely SNF rehab since her boyfriend is not able to provide this. However, will continue to update as she may progress to home health follow-up level. OT will continue to follow while admitted.     Follow Up Recommendations  Home health OT;SNF;Supervision/Assistance - 24 hour    Equipment Recommendations  3 in 1 bedside commode(if to return home)    Recommendations for Other Services       Precautions / Restrictions Precautions Precautions: Fall Precaution Comments: Very unsteady, numbness in feet, LB weakness Restrictions Weight Bearing Restrictions: No      Mobility Bed Mobility Overal bed mobility: Needs Assistance Bed Mobility: Supine to Sit     Supine to sit: Supervision        Transfers Overall transfer level: Needs assistance Equipment used: Rolling  walker (2 wheeled) Transfers: Sit to/from Stand Sit to Stand: Min assist;+2 safety/equipment;Mod assist         General transfer comment: Min assist +2 to power up to standing. Mod assist +2 for safety for ambulation and to sustain upright.     Balance Overall balance assessment: Needs assistance Sitting-balance support: No upper extremity supported;Feet supported Sitting balance-Leahy Scale: Good     Standing balance support: Bilateral upper extremity supported;During functional activity Standing balance-Leahy Scale: Poor                             ADL either performed or assessed with clinical judgement   ADL Overall ADL's : Needs assistance/impaired Eating/Feeding: Supervision/ safety;Sitting   Grooming: Minimal assistance;Standing Grooming Details (indicate cue type and reason): Min assist for balance. Pt attempting to use face wash as toothpaste.  Upper Body Bathing: Minimal assistance;Sitting   Lower Body Bathing: Moderate assistance;Sit to/from stand   Upper Body Dressing : Minimal assistance;Sitting   Lower Body Dressing: Moderate assistance;Sit to/from stand   Toilet Transfer: Ambulation;RW;Moderate assistance;+2 for safety/equipment Toilet Transfer Details (indicate cue type and reason): Mod assist to maintain stability. Did ambulate in hallway with mod assist and chair follow.  Toileting- Clothing Manipulation and Hygiene: Moderate assistance;Sit to/from stand       Functional mobility during ADLs: Moderate assistance;Rolling walker General ADL Comments: Pt with instability and decreased activity tolerance. Decreased coordination bilateral lower extremitites with poor sensation and poor proprioception.      Vision Baseline Vision/History: Wears glasses Wears Glasses: At all times Patient Visual Report: No change from baseline  Vision Assessment?: No apparent visual deficits     Perception     Praxis      Pertinent Vitals/Pain Pain  Assessment: Faces Faces Pain Scale: Hurts a little bit Pain Location: Bilateral lower extremities Pain Descriptors / Indicators: Discomfort Pain Intervention(s): Limited activity within patient's tolerance;Monitored during session     Hand Dominance     Extremity/Trunk Assessment Upper Extremity Assessment Upper Extremity Assessment: Generalized weakness;RUE deficits/detail;LUE deficits/detail(bilateral tremor) RUE Deficits / Details: Tremor and decreased control RUE Sensation: decreased proprioception RUE Coordination: decreased fine motor;decreased gross motor LUE Deficits / Details: Tremor and decreased control LUE Sensation: decreased proprioception LUE Coordination: decreased fine motor;decreased gross motor   Lower Extremity Assessment Lower Extremity Assessment: Defer to PT evaluation;RLE deficits/detail;LLE deficits/detail RLE Sensation: decreased light touch;decreased proprioception RLE Coordination: decreased gross motor LLE Sensation: decreased light touch;decreased proprioception LLE Coordination: decreased gross motor       Communication Communication Communication: No difficulties   Cognition Arousal/Alertness: Awake/alert Behavior During Therapy: WFL for tasks assessed/performed Overall Cognitive Status: No family/caregiver present to determine baseline cognitive functioning Area of Impairment: Orientation;Attention;Memory;Following commands;Safety/judgement;Awareness;Problem solving                 Orientation Level: Disoriented to;Situation;Time(Stating Thursday when asked the month; cues to determine Jun) Current Attention Level: Selective Memory: Decreased short-term memory Following Commands: Follows one step commands consistently;Follows multi-step commands inconsistently Safety/Judgement: Decreased awareness of safety;Decreased awareness of deficits Awareness: Emergent Problem Solving: Difficulty sequencing General Comments: Pt with some  confusion; with cues, able to determine the month/year. Confused and difficulty with praxis to don gown.    General Comments       Exercises     Shoulder Instructions      Home Living Family/patient expects to be discharged to:: Private residence Living Arrangements: Spouse/significant other Available Help at Discharge: Available PRN/intermittently;Friend(s)(boyfriend) Type of Home: Apartment Home Access: Level entry     Home Layout: One level     Bathroom Shower/Tub: Chief Strategy OfficerTub/shower unit   Bathroom Toilet: Standard     Home Equipment: None   Additional Comments: Lives with boyfriend but he works.       Prior Functioning/Environment Level of Independence: Independent        Comments: Working for a International aid/development workerveterinarian prior to [employer]        OT Problem List: Decreased strength;Decreased range of motion;Decreased activity tolerance;Impaired balance (sitting and/or standing);Decreased knowledge of use of DME or AE;Decreased safety awareness;Decreased cognition;Decreased knowledge of precautions;Decreased coordination;Impaired UE functional use      OT Treatment/Interventions: Self-care/ADL training;Therapeutic exercise;Energy conservation;DME and/or AE instruction;Therapeutic activities;Patient/family education;Balance training;Cognitive remediation/compensation    OT Goals(Current goals can be found in the care plan section) Acute Rehab OT Goals Patient Stated Goal: to go home OT Goal Formulation: With patient Time For Goal Achievement: 10/03/18 Potential to Achieve Goals: Good ADL Goals Pt Will Perform Grooming: with modified independence;standing Pt Will Perform Lower Body Dressing: with modified independence;sit to/from stand Pt Will Transfer to Toilet: with modified independence;ambulating;regular height toilet Pt Will Perform Toileting - Clothing Manipulation and hygiene: with modified independence;sit to/from stand Pt Will Perform Tub/Shower Transfer: Tub transfer;with  modified independence;ambulating;3 in 1;rolling walker Additional ADL Goal #1: Pt will demonstrate improved problem solving skills to complete 3-step grooming task with no more than minimal cues for sequencing.  OT Frequency: Min 2X/week   Barriers to D/C:            Co-evaluation PT/OT/SLP Co-Evaluation/Treatment: Yes Reason for Co-Treatment: Necessary to address cognition/behavior during functional activity;For  patient/therapist safety   OT goals addressed during session: ADL's and self-care      AM-PAC OT "6 Clicks" Daily Activity     Outcome Measure Help from another person eating meals?: None Help from another person taking care of personal grooming?: A Little Help from another person toileting, which includes using toliet, bedpan, or urinal?: A Lot Help from another person bathing (including washing, rinsing, drying)?: A Lot Help from another person to put on and taking off regular upper body clothing?: None Help from another person to put on and taking off regular lower body clothing?: A Lot 6 Click Score: 17   End of Session Equipment Utilized During Treatment: Gait belt;Rolling walker Nurse Communication: Mobility status  Activity Tolerance: Patient tolerated treatment well Patient left: in chair;with call bell/phone within reach;with chair alarm set  OT Visit Diagnosis: Other abnormalities of gait and mobility (R26.89);Muscle weakness (generalized) (M62.81);Other symptoms and signs involving cognitive function                Time: 8416-6063 OT Time Calculation (min): 32 min Charges:  OT General Charges $OT Visit: 1 Visit OT Evaluation $OT Eval Moderate Complexity: [place] A Sallee Hogrefe 09/19/2018, 2:30 PM

## 2018-09-19 NOTE — Evaluation (Signed)
Physical Therapy Evaluation Patient Details Name: Donna Patterson MRN: [id number] DOB: January 06, 1986 Today's Date: 09/19/2018   History of Present Illness  Pt is a 33 y.o. female with PMH significant for chronic alcoholism and alcohol abuse who presented on 09/15/2018 with BLE weakness x1 week with fall. Admitted with severe hypokalemia and being managed for alcohol withdrawal.   Clinical Impression  Pt admitted as above and presenting with functional mobility limitations 2* generalized weakness; peripheral sensory changes and significant balance and coordination deficits.  Pt hopes to progress to dc home but this is dependent on acute stay progress and level of assist at home    Follow Up Recommendations Home health PT;Supervision/Assistance - 24 hour;SNF(dependent on acute stay progress and home assist available)    Equipment Recommendations  Rolling walker with 5" wheels    Recommendations for Other Services       Precautions / Restrictions Precautions Precautions: Fall Precaution Comments: Very unsteady, numbness in feet, LB weakness Restrictions Weight Bearing Restrictions: No      Mobility  Bed Mobility Overal bed mobility: Needs Assistance Bed Mobility: Supine to Sit     Supine to sit: Supervision     General bed mobility comments: Increased time, no physical assist  Transfers Overall transfer level: Needs assistance Equipment used: Rolling walker (2 wheeled) Transfers: Sit to/from Stand Sit to Stand: Min assist;Mod assist;+2 physical assistance;+2 safety/equipment         General transfer comment: Assist to bring wt up and fwd and to balance in standing with RW  Ambulation/Gait Ambulation/Gait assistance: Mod assist;+2 safety/equipment Gait Distance (Feet): 100 Feet(and 15' into bathroom) Assistive device: Rolling walker (2 wheeled) Gait Pattern/deviations: Step-through pattern;Decreased step length - right;Decreased step length - left;Shuffle;Wide base of  support;Ataxic Gait velocity: decr   General Gait Details: heavy reliance on UEs for support/balance; ataxic type gait with bil partial foot drop.  Physical assist for RW management, support and balance  Stairs            Wheelchair Mobility    Modified Rankin (Stroke Patients Only)       Balance Overall balance assessment: Needs assistance Sitting-balance support: No upper extremity supported;Feet supported Sitting balance-Leahy Scale: Good     Standing balance support: Bilateral upper extremity supported;During functional activity Standing balance-Leahy Scale: Poor                               Pertinent Vitals/Pain Pain Assessment: Faces Faces Pain Scale: Hurts a little bit Pain Location: Bilateral lower extremities Pain Descriptors / Indicators: Discomfort Pain Intervention(s): Limited activity within patient's tolerance;Monitored during session    Home Living Family/patient expects to be discharged to:: Private residence Living Arrangements: Spouse/significant other Available Help at Discharge: Available PRN/intermittently;Friend(s) Type of Home: Apartment Home Access: Level entry     Home Layout: One level Home Equipment: None Additional Comments: Lives with boyfriend but he works.     Prior Function Level of Independence: Independent         Comments: Working for a Animal nutritionist prior to [employer]        Extremity/Trunk Assessment   Upper Extremity Assessment Upper Extremity Assessment: Defer to OT evaluation RUE Deficits / Details: Tremor and decreased control RUE Sensation: decreased proprioception RUE Coordination: decreased fine motor;decreased gross motor LUE Deficits / Details: Tremor and decreased control LUE Sensation: decreased proprioception LUE Coordination: decreased fine motor;decreased gross motor    Lower Extremity  Assessment Lower Extremity Assessment: Generalized weakness RLE Sensation:  decreased light touch;decreased proprioception RLE Coordination: decreased gross motor LLE Sensation: decreased light touch;decreased proprioception LLE Coordination: decreased gross motor       Communication   Communication: No difficulties  Cognition Arousal/Alertness: Awake/alert Behavior During Therapy: WFL for tasks assessed/performed Overall Cognitive Status: No family/caregiver present to determine baseline cognitive functioning Area of Impairment: Orientation;Attention;Memory;Following commands;Safety/judgement;Awareness;Problem solving                 Orientation Level: Disoriented to;Situation;Time Current Attention Level: Selective Memory: Decreased short-term memory Following Commands: Follows one step commands consistently;Follows multi-step commands inconsistently Safety/Judgement: Decreased awareness of safety;Decreased awareness of deficits Awareness: Emergent Problem Solving: Difficulty sequencing General Comments: Pt with some confusion; with cues, able to determine the month/year. Confused and difficulty with praxis to don gown.       General Comments      Exercises     Assessment/Plan    PT Assessment Patient needs continued PT services  PT Problem List Decreased strength;Decreased activity tolerance;Decreased balance;Decreased mobility;Decreased knowledge of use of DME;Pain;Decreased safety awareness       PT Treatment Interventions DME instruction;Gait training;Stair training;Functional mobility training;Therapeutic activities;Therapeutic exercise;Patient/family education    PT Goals (Current goals can be found in the Care Plan section)  Acute Rehab PT Goals Patient Stated Goal: to go home PT Goal Formulation: With patient Time For Goal Achievement: 10/02/18 Potential to Achieve Goals: Fair    Frequency Min 3X/week   Barriers to discharge Decreased caregiver support BF works    Co-evaluation PT/OT/SLP Co-Evaluation/Treatment:  Yes Reason for Co-Treatment: For Academic librarianpatient/therapist safety;Necessary to address cognition/behavior during functional activity PT goals addressed during session: Mobility/safety with mobility OT goals addressed during session: ADL's and self-care       AM-PAC PT "6 Clicks" Mobility  Outcome Measure Help needed turning from your back to your side while in a flat bed without using bedrails?: None Help needed moving from lying on your back to sitting on the side of a flat bed without using bedrails?: None Help needed moving to and from a bed to a chair (including a wheelchair)?: A Lot Help needed standing up from a chair using your arms (e.g., wheelchair or bedside chair)?: A Lot Help needed to walk in hospital room?: A Lot Help needed climbing 3-5 steps with a railing? : A Lot 6 Click Score: 16    End of Session Equipment Utilized During Treatment: Gait belt Activity Tolerance: Patient tolerated treatment well;Patient limited by fatigue Patient left: in chair;with call bell/phone within reach;with chair alarm set Nurse Communication: Mobility status PT Visit Diagnosis: Muscle weakness (generalized) (M62.81);History of falling (Z91.81);Difficulty in walking, not elsewhere classified (R26.2)    Time: 1120-1150 PT Time Calculation (min) (ACUTE ONLY): 30 min   Charges:   PT Evaluation $PT Eval Moderate Complexity: 1 Mod          Mauro KaufmannHunter Damiano Stamper PT Acute Rehabilitation Services Pager [phone] Office [phone]   Judas Mohammad 09/19/2018, 3:21 PM

## 2018-09-20 LAB — CBC
HCT: 27.7 % — ABNORMAL LOW (ref 36.0–46.0)
Hemoglobin: 8.7 g/dL — ABNORMAL LOW (ref 12.0–15.0)
MCH: 33.6 pg (ref 26.0–34.0)
MCHC: 31.4 g/dL (ref 30.0–36.0)
MCV: 106.9 fL — ABNORMAL HIGH (ref 80.0–100.0)
Platelets: 252 10*3/uL (ref 150–400)
RBC: 2.59 MIL/uL — ABNORMAL LOW (ref 3.87–5.11)
RDW: 18.4 % — ABNORMAL HIGH (ref 11.5–15.5)
WBC: 9.4 10*3/uL (ref 4.0–10.5)
nRBC: 0 % (ref 0.0–0.2)

## 2018-09-20 LAB — BASIC METABOLIC PANEL
Anion gap: 8 (ref 5–15)
BUN: 12 mg/dL (ref 6–20)
CO2: 24 mmol/L (ref 22–32)
Calcium: 8.5 mg/dL — ABNORMAL LOW (ref 8.9–10.3)
Chloride: 103 mmol/L (ref 98–111)
Creatinine, Ser: 0.63 mg/dL (ref 0.44–1.00)
GFR calc Af Amer: >60 mL/min (ref >60)
GFR calc non Af Amer: >60 mL/min (ref >60)
Glucose, Bld: 86 mg/dL (ref 70–99)
Potassium: 4.4 mmol/L (ref 3.5–5.1)
Sodium: 135 mmol/L (ref 135–145)

## 2018-09-20 MED ORDER — HYDRALAZINE HCL 25 MG PO TABS
25.0000 mg | ORAL_TABLET | Freq: Four times a day (QID) | ORAL | Status: DC | PRN
  Administered 2018-09-20 – 2018-09-21 (×2): 25 mg via ORAL
  Filled 2018-09-20 (×4): qty 1

## 2018-09-20 MED ORDER — ACETAMINOPHEN 325 MG PO TABS: 650.0000 mg | ORAL_TABLET | Freq: Four times a day (QID) | ORAL | Status: DC | PRN

## 2018-09-20 MED ORDER — TRAMADOL HCL 50 MG PO TABS
50.0000 mg | ORAL_TABLET | Freq: Four times a day (QID) | ORAL | Status: DC | PRN
  Administered 2018-09-20 – 2018-09-22 (×5): 50 mg via ORAL
  Filled 2018-09-20 (×6): qty 1

## 2018-09-20 MED ORDER — ACETAMINOPHEN 650 MG RE SUPP: 650.0000 mg | Freq: Four times a day (QID) | RECTAL | Status: DC | PRN

## 2018-09-20 NOTE — Progress Notes (Signed)
PROGRESS NOTE  Donna Patterson JJK:[id number] DOB: 1986-03-10 DOA: 09/15/2018 PCP: Patient, No Pcp Per  HPI/Recap of past 24 hours: 33 y.o. year old female with medical history significant for chronic alcoholism and alcohol abuse who presented on 09/15/2018 with reported bilateral leg weakness x1 week and fall.  Per EMS was found to have blood pressure 96/60 requiring fluid boluses in the ED.  Patient admitted for severe hypokalemia causing lower extremity weakness and now being managed for active alcohol withdrawal.   Patient with slow improvement.  Seen by physical and Occupational Therapy recommend either skilled nursing or home with home health and 24-hour supervision.  Patient lives the boyfriend who is not able to provide that.  She still somewhat shaky and tachycardic although a little bit improved. Patient is still complaining of lower extremity weakness and pain, does not feel ready for discharge yet. Reports she was able to go to the bathroom but needed lots of help.   Assessment/Plan:  Bilateral lower extremity weakness secondary to hypokalemia, suspect also contributed by chronic alcohol use/deconditioning: Electrolytes has been replaced.  She continues to work with PT OT and SNF/home health care with 24-hour supervision has been advised. C/o pain in lower extremity will add tramadol as needed as Tylenol not helping.  Alcohol intoxication with uncomplicated withdrawal.  Somewhat shaky and tremulous remains on CIWA Ativan, continue multivitamins and thiamine's.  High anion gap metabolic acidosis in the setting of alcoholic ketosis alcohol liver disease, resolved.  Increase oral intake.  ?  Infection: No respiratory symptoms, urinalysis unrevealing.  Procalcitonin was up at 0.44, chest x-ray was obtained 6/28-with slight left base atelectasis.  She is afebrile and WBC counts have improved.  Continue supportive care and continue to monitor.  Visual hallucinations: To have resolved   Chronic anemia likely from etoh abuse/chronic disease.   Cont folate.  Code Status: Full code  Family Communication: discussed POC with the patient in detail, available to update family if needed.    Disposition Plan: Anticipate discharge in next 24 to 48 hours  Consultants:  None  Procedures:  None  Antimicrobials:  None  DVT prophylaxis: Lovenox   Objective: Vitals:   09/20/18 0602 09/20/18 1100  BP: (!) 133/98 (!) 136/98  Pulse: (!) 107 (!) 105  Resp: 20   Temp: 98.9 F (37.2 C)   SpO2: 93%     Intake/Output Summary (Last 24 hours) at 09/20/2018 1353 Last data filed at 09/19/2018 1900 Gross per 24 hour  Intake 120 ml  Output -  Net 120 ml   Filed Weights   09/15/18 1121 09/18/18 0500 09/20/18 0602  Weight: 56.7 kg 62.4 kg 64.2 kg   Body mass index is 24.29 kg/m.  Exam: General exam: Calm, comfortable, not in acute distress, older for age.  HEENT:Oral mucosa moist, Ear/Nose WNL grossly, dentition normal. Respiratory system: Bilateral equal air entry, no crackles and wheezing, no use of accessory muscle, non tender on palpation. Cardiovascular system: regular rate and rhythm, S1 & S2 heard, No JVD/murmurs. Gastrointestinal system: Abdomen soft, non-tender, non-distended, BS +. No hepatosplenomegaly palpable. Nervous System:Alert, awake and oriented at baseline. Able to move UE and LE, sensation intact. Extremities: No edema, distal peripheral pulses palpable.  Skin: No rashes,no icterus. MSK: Normal muscle bulk,tone, power  Data Reviewed: CBC: Recent Labs  Lab 09/16/18 0445 09/17/18 0907 09/18/18 0400 09/19/18 0805 09/20/18 0427  WBC 9.3 11.5* 12.1* 12.5* 9.4  HGB 8.0* 8.6* 8.5* 9.0* 8.7*  HCT 24.4* 26.8* 25.8* 28.9* 27.7*  MCV 102.5* 105.5* 104.9* 105.9* 106.9*  PLT 183 211 296 282 252   Basic Metabolic Panel: Recent Labs  Lab 09/15/18 1238 09/15/18 1733 09/16/18 0445 09/17/18 0907 09/18/18 0400 09/19/18 0805 09/20/18 0427  NA 129*   --  134* 137  --  136 135  K <2.0*  --  2.6* 4.0  --  4.8 4.4  CL 71*  --  90* 97*  --  100 103  CO2 38*  --  35* 30  --  25 24  GLUCOSE 84  --  77 99  --  78 86  BUN <5*  --  5* 6  --  12 12  CREATININE 0.69 0.79 0.61 0.51  --  0.46 0.63  CALCIUM 8.5*  --  7.9* 8.4*  --  9.0 8.5*  MG 1.5*  --  2.9* 2.2 1.7 1.8  --    GFR: Estimated Creatinine Clearance: 85.6 mL/min (by C-G formula based on SCr of 0.63 mg/dL). Liver Function Tests: Recent Labs  Lab 09/15/18 1238 09/16/18 0445 09/17/18 0907 09/19/18 0805  AST 142* 124* 119* 132*  ALT 46* 39 36 37  ALKPHOS 182* 149* 145* 145*  BILITOT 0.8 0.8 1.2 1.2  PROT 6.6 5.3* 5.4* 6.4*  ALBUMIN 2.8* 2.2* 2.3* 2.7*   Recent Labs  Lab 09/15/18 1238  LIPASE 67*   Recent Labs  Lab 09/17/18 0907  AMMONIA 29   Coagulation Profile: Recent Labs  Lab 09/16/18 0445  INR 0.9   Cardiac Enzymes: No results for input(s): CKTOTAL, CKMB, CKMBINDEX, TROPONINI in the last 168 hours. BNP (last 3 results) No results for input(s): PROBNP in the last 8760 hours. HbA1C: No results for input(s): HGBA1C in the last 72 hours. CBG: No results for input(s): GLUCAP in the last 168 hours. Lipid Profile: No results for input(s): CHOL, HDL, LDLCALC, TRIG, CHOLHDL, LDLDIRECT in the last 72 hours. Thyroid Function Tests: No results for input(s): TSH, T4TOTAL, FREET4, T3FREE, THYROIDAB in the last 72 hours. Anemia Panel: No results for input(s): VITAMINB12, FOLATE, FERRITIN, TIBC, IRON, RETICCTPCT in the last 72 hours. Urine analysis:    Component Value Date/Time   COLORURINE YELLOW 09/16/2018 0134   APPEARANCEUR CLEAR 09/16/2018 0134   LABSPEC 1.011 09/16/2018 0134   PHURINE 6.0 09/16/2018 0134   GLUCOSEU NEGATIVE 09/16/2018 0134   HGBUR NEGATIVE 09/16/2018 0134   BILIRUBINUR NEGATIVE 09/16/2018 0134   KETONESUR NEGATIVE 09/16/2018 0134   PROTEINUR NEGATIVE 09/16/2018 0134   NITRITE NEGATIVE 09/16/2018 0134   LEUKOCYTESUR NEGATIVE 09/16/2018  0134   Sepsis Labs: @LABRCNTIP (procalcitonin:4,lacticidven:4)  ) Recent Results (from the past 240 hour(s))  Novel Coronavirus,NAA,(SEND-OUT TO REF LAB - TAT 24-48 hrs); Hosp Order     Status: None   Collection Time: 09/15/18 12:07 PM   Specimen: Nasopharyngeal Swab; Respiratory  Result Value Ref Range Status   SARS-CoV-2, NAA NOT DETECTED NOT DETECTED Final    Comment: (NOTE) This test was developed and its performance characteristics determined by [facility]. This test has not been FDA cleared or approved. This test has been authorized by FDA under an Emergency Use Authorization (EUA). This test is only authorized for the duration of time the declaration that circumstances exist justifying the authorization of the emergency use of in vitro diagnostic tests for detection of SARS-CoV-2 virus and/or diagnosis of COVID-19 infection under section 564(b)(1) of the Act, 21 U.S.C. 409WJX-9(J)(4360bbb-3(b)(1), unless the authorization is terminated or revoked sooner. When diagnostic testing is negative, the possibility of a false negative result should  be considered in the context of a patient's recent exposures and the presence of clinical signs and symptoms consistent with COVID-19. An individual without symptoms of COVID-19 and who is not shedding SARS-CoV-2 virus would expect to have a negative (not detected) result in this assay. Performed  At: [facility] [address] [place], [place] [id number] Jolene SchimkeNagendra Sanjai MD QV:[id number]:[phone]    Coronavirus Source NASOPHARYNGEAL  Final    Comment: Performed at [facility], [address] [place], [place] [address]      Studies: Dg Chest Port 1 View  Result Date: 09/19/2018 CLINICAL DATA:  Shortness of breath EXAM: PORTABLE CHEST 1 VIEW COMPARISON:  None. FINDINGS: There is slight atelectatic change in the left base. Lungs elsewhere are clear. Heart size and pulmonary vascularity are normal. No adenopathy. No bone  lesions. IMPRESSION: Slight left base atelectasis. Lungs elsewhere clear. Cardiac silhouette within normal limits. Electronically Signed   By: Bretta BangWilliam  Woodruff III M.D.   On: 09/19/2018 16:25    Scheduled Meds: . enoxaparin (LOVENOX) injection  40 mg Subcutaneous Q24H  . folic acid  1 mg Oral Daily  . LORazepam  0-4 mg Intravenous Q6H   Followed by  . LORazepam  0-4 mg Intravenous Q12H  . multivitamin with minerals  1 tablet Oral Daily  . nicotine  21 mg Transdermal Daily  . thiamine  100 mg Oral Daily    Continuous Infusions:   LOS: 5 days     Lanae Boastamesh Nazanin Kinner, MD [facility]  To reach me or the doctor on call, go to: [email or web address] Password [email or web address]  09/20/2018, 1:53 PM

## 2018-09-20 NOTE — Progress Notes (Signed)
Dr. Maren Beach text, Patient's BP 148/110 p-105. Orders written.

## 2018-09-20 NOTE — TOC Initial Note (Signed)
Transition of Care [facility]) - Initial/Assessment Note    Patient Details  Name: Donna Patterson MRN: [id number] Date of Birth: 1985/07/15  [facility]) CM/SW Contact:    Joaquin Courts, RN Phone Number: 09/20/2018, 3:05 PM  Clinical Narrative:    CM spoke with patient at bedside.  Patient reports that she lives at home with her boyfriend, who is not there 24 hours a day because he works.  Patient is aware of recommendation for SNF vs HH with 24 hours assistance.  Due to patients age and her lack of insurance she would be difficult to place in a SNF. Patient is aware that SNF may not be a viable option for her. Expressed that she can go and stay with her mother who can care for her. CM spoke with patient about etoh use. Patient reports she previously completed a program with a facility call [facility] which is in [place]. She does not want to return there but would be willing to look at outpatient resources.  CM spoke with patient's mother Pam with patient's permission. Pam was initially surprised that the patient stated she could come stay with her as she lives in [place]. However, during the conversation with CM, Pam did state that she will speak with her daughter and consider a possible plan for the patient to stay with her so she can care for her as 24 hour care is not available in her current living situation. CM will explore options for charity [facility].  Pam was made aware of this however, even with a possible charity HH, there is still a need for 24 hour care. Should patient go to [place] with her mother, charity HH through local agencies would then not be an option. Further discussion needed to determine discharge plan.    Expected Discharge Plan: [place] Barriers to Discharge: Continued Medical Work up   Patient Goals and CMS Choice        Expected Discharge Plan and Services Expected Discharge Plan: [place]   Discharge Planning  Services: CM Consult   Living arrangements for the past 2 months: Apartment Expected Discharge Date: (unknown)               DME Arranged: N/A DME Agency: NA       HH Arranged: NA HH Agency: NA        Prior Living Arrangements/Services Living arrangements for the past 2 months: Apartment Lives with:: Significant Other Patient language and need for interpreter reviewed:: Yes Do you feel safe going back to the place where you live?: Yes      Need for Family Participation in Patient Care: Yes (Comment) Care giver support system in place?: Yes (comment)   Criminal Activity/Legal Involvement Pertinent to Current Situation/Hospitalization: No - Comment as needed  Activities of Daily Living Home Assistive Devices/Equipment: Eyeglasses ADL Screening (condition at time of admission) Patient's cognitive ability adequate to safely complete daily activities?: Yes Is the patient deaf or have difficulty hearing?: No Does the patient have difficulty seeing, even when wearing glasses/contacts?: No Does the patient have difficulty concentrating, remembering, or making decisions?: No Patient able to express need for assistance with ADLs?: Yes Does the patient have difficulty dressing or bathing?: Yes Independently performs ADLs?: No Communication: Independent Dressing (OT): Needs assistance Is this a change from baseline?: Change from baseline, expected to last >3 days Grooming: Needs assistance Is this a change from baseline?: Change from baseline, expected to last >  3 days Feeding: Needs assistance Is this a change from baseline?: Change from baseline, expected to last >3 days Bathing: Needs assistance Is this a change from baseline?: Change from baseline, expected to last >3 days Toileting: Needs assistance Is this a change from baseline?: Change from baseline, expected to last >3days In/Out Bed: Needs assistance Is this a change from baseline?: Change from baseline, expected to last  >3 days Walks in Home: Needs assistance Is this a change from baseline?: Change from baseline, expected to last >3 days Does the patient have difficulty walking or climbing stairs?: Yes Weakness of Legs: Both Weakness of Arms/Hands: None  Permission Sought/Granted   Permission granted to share information with : Yes, Verbal Permission Granted  Share Information with NAME: Pam     Permission granted to share info w Relationship: mother     Emotional Assessment Appearance:: Appears stated age Attitude/Demeanor/Rapport: Engaged Affect (typically observed): Accepting Orientation: : Oriented to Self, Oriented to Place, Oriented to  Time, Oriented to Situation   Psych Involvement: No (comment)  Admission diagnosis:  Hypokalemia [E87.6] Hypomagnesemia [E83.42] Weakness [R53.1] Uncomplicated alcohol dependence (HCC) [F10.20] Anemia, unspecified type [D64.9] Patient Active Problem List   Diagnosis Date Noted  . Visual hallucinations 09/16/2018  . Anemia 09/16/2018  . Acute hypokalemia 09/15/2018  . Alcohol intoxication (HCC) 09/15/2018  . Alcohol dependence with uncomplicated withdrawal (HCC) 09/15/2018  . Hypomagnesemia 09/15/2018  . High anion gap metabolic acidosis 09/15/2018  . Alcoholic liver disease (HCC) 09/15/2018  . Hypokalemia 09/15/2018   PCP:  Patient, No Pcp Per Pharmacy:   [facility] [place], [place] - [address] [place] [address] Phone: [phone] Fax: [phone]     Social Determinants of Health (SDOH) Interventions    Readmission Risk Interventions No flowsheet data found.

## 2018-09-20 NOTE — Progress Notes (Signed)
Dr. Maren Beach  text, patient states Tylenol 650 mg not working for her pain, 8/10. Orders written.

## 2018-09-21 DIAGNOSIS — I10 Essential (primary) hypertension: Secondary | ICD-10-CM

## 2018-09-21 DIAGNOSIS — R000 Tachycardia, unspecified: Secondary | ICD-10-CM

## 2018-09-21 MED ORDER — METOPROLOL TARTRATE 25 MG PO TABS: 12.5000 mg | ORAL_TABLET | Freq: Two times a day (BID) | ORAL | Status: DC

## 2018-09-21 MED ORDER — GABAPENTIN 100 MG PO CAPS
100.0000 mg | ORAL_CAPSULE | Freq: Two times a day (BID) | ORAL | Status: DC
  Administered 2018-09-21 – 2018-09-22 (×3): 100 mg via ORAL
  Filled 2018-09-21 (×3): qty 1

## 2018-09-21 MED ORDER — METOPROLOL TARTRATE 25 MG PO TABS
25.0000 mg | ORAL_TABLET | Freq: Two times a day (BID) | ORAL | Status: DC
  Administered 2018-09-21 – 2018-09-22 (×3): 25 mg via ORAL
  Filled 2018-09-21 (×3): qty 1

## 2018-09-21 NOTE — Evaluation (Signed)
Occupational Therapy Evaluation Patient Details Name: Donna Patterson MRN: [id number] DOB: 10-Feb-1986 Today's Date: 09/21/2018    History of Present Illness Pt is a 32 y.o. female with PMH significant for chronic alcoholism and alcohol abuse who presented on 09/15/2018 with BLE weakness x1 week with fall. Admitted with severe hypokalemia and being managed for alcohol withdrawal.    Clinical Impression   Pt showing gains in OT.  Needs min A for sit to stand during ADLs and mobility.  She now plans to return home with her Mother, who can provide 24/7    Follow Up Recommendations  Supervision/Assistance - 24 hour(pt won't qualify for HHOT, uninsured)    Equipment Recommendations  None recommended by OT(likely)    Recommendations for Other Services       Precautions / Restrictions Precautions Precautions: Fall Precaution Comments: Very unsteady, numbness in feet, LB weakness Restrictions Weight Bearing Restrictions: No      Mobility Bed Mobility Overal bed mobility: Modified Independent Bed Mobility: Supine to Sit     Supine to sit: Modified independent (Device/Increase time)        Transfers Overall transfer level: Needs assistance Equipment used: Rolling walker (2 wheeled) Transfers: Sit to/from Stand Sit to Stand: Min assist;+2 physical assistance         General transfer comment: cues for hand placement and A to steady upon standing. Poor eccentric control standing > sitting    Balance           Standing balance support: Bilateral upper extremity supported;During functional activity Standing balance-Leahy Scale: Poor                             ADL either performed or assessed with clinical judgement   ADL       Grooming: Oral care;Wash/dry hands;Wash/dry face;Standing;Min guard                   Toilet Transfer: Minimal assistance;Ambulation;RW(chair, simulated)             General ADL Comments: Stood for grooming; recalled  2/3 tasks.  Ambulated in hall with 3 rest breaks (see PT note) and min A.  Pt had already used bathroom. She reports she plans to return to her mother's home at d/c.  Mom doesn't work.  She has a standard toilet and tub/shower but she is up on the third floor without an Naval architect     Praxis      Pertinent Vitals/Pain Pain Assessment: Faces Faces Pain Scale: Hurts little more Pain Location: Bilateral lower extremities Pain Descriptors / Indicators: Numbness;Grimacing Pain Intervention(s): Monitored during session;Limited activity within patient's tolerance     Hand Dominance     Extremity/Trunk Assessment Upper Extremity Assessment RUE Deficits / Details: tremors present but able to complete oral care at sink from standing           Communication     Cognition Arousal/Alertness: Awake/alert Behavior During Therapy: WFL for tasks assessed/performed Overall Cognitive Status: No family/caregiver present to determine baseline cognitive functioning                           Safety/Judgement: Decreased awareness of safety;Decreased awareness of deficits Awareness: Emergent Problem Solving: Difficulty sequencing     General Comments  scab on R knee which she states is from a fall  Exercises     Shoulder Instructions      Home Living                                          Prior Functioning/Environment                   OT Problem List:        OT Treatment/Interventions:      OT Goals(Current goals can be found in the care plan section)    OT Frequency: Min 2X/week   Barriers to D/C:            Co-evaluation   Reason for Co-Treatment: For patient/therapist safety PT goals addressed during session: Mobility/safety with mobility;Balance;Proper use of DME OT goals addressed during session: ADL's and self-care      AM-PAC OT "6 Clicks" Daily Activity     Outcome Measure Help from another  person eating meals?: None Help from another person taking care of personal grooming?: A Little Help from another person toileting, which includes using toliet, bedpan, or urinal?: A Little Help from another person bathing (including washing, rinsing, drying)?: A Lot Help from another person to put on and taking off regular upper body clothing?: A Little Help from another person to put on and taking off regular lower body clothing?: A Lot 6 Click Score: 17   End of Session Nurse Communication: Mobility status  Activity Tolerance: Patient tolerated treatment well Patient left: in chair;with call bell/phone within reach;with chair alarm set  OT Visit Diagnosis: Other abnormalities of gait and mobility (R26.89);Muscle weakness (generalized) (M62.81);Other symptoms and signs involving cognitive function                Time: 0941-1010 OT Time Calculation (min): 29 min Charges:  OT General Charges $OT Visit: 1 Visit OT Treatments $Self Care/Home Management : 8-22 mins  Donna Patterson, OTR/L Acute Rehabilitation Services [phone] [facility] pager [phone] office 09/21/2018  Donna Patterson 09/21/2018, 11:13 AM

## 2018-09-21 NOTE — TOC Progression Note (Signed)
Transition of Care [facility]) - Progression Note    Patient Details  Name: Donna Patterson MRN: [id number] Date of Birth: 07/15/1985  Transition of Care [facility]) CM/SW Contact  Joaquin Courts, RN Phone Number: 09/21/2018, 1:42 PM  Clinical Narrative:   CM spoke with patient at bed side and mother over the telephone. Patient states she would like to go and stay with her mother after d/c. Cm confirmed with mother, Donna Patterson, that patient can come and stay with her. Should pam be unable to come and pick her up from the hospital at d/c, patient could go home with her boyfriend and pam will drive down from [place] to pick her up from her apartment.  Patient and mother aware that charity HHPT would not be an option with patient going to [place], however patient is open to doing exercises provided by physical therapy at home on her own. Patient will need a walker and [employer] rep Thedore Mins was notified to see if a charity option was available to provide walker free of cost.      Expected Discharge Plan: [place] Barriers to Discharge: Continued Medical Work up  Expected Discharge Plan and Services Expected Discharge Plan: [place]   Discharge Planning Services: CM Consult   Living arrangements for the past 2 months: Apartment Expected Discharge Date: (unknown)               DME Arranged: N/A DME Agency: NA       HH Arranged: NA HH Agency: NA         Social Determinants of Health (SDOH) Interventions    Readmission Risk Interventions No flowsheet data found.

## 2018-09-21 NOTE — Progress Notes (Signed)
PROGRESS NOTE  Donna Patterson NGE:[id number] DOB: 1985/05/13 DOA: 09/15/2018 PCP: Patient, No Pcp Per  HPI/Recap of past 24 hours: 33 y.o. year old female with medical history significant for chronic alcoholism and alcohol abuse who presented on 09/15/2018 with reported bilateral leg weakness x1 week and fall.  Per EMS was found to have blood pressure 96/60 requiring fluid boluses in the ED.  Patient admitted for severe hypokalemia causing lower extremity weakness and now being managed for active alcohol withdrawal.   Patient with slow improvement.  Seen by physical and Occupational Therapy recommend either skilled nursing or home with home health and 24-hour supervision.  Patient lives the boyfriend who is not able to provide that.  She still somewhat shaky and tachycardic although a little bit improved. Patient is still complaining of lower extremity weakness and pain, does not feel ready for discharge yet. Reports she was able to go to the bathroom but needed lots of help. Did PT and became tachycardic in 141.   Subjective Patient was very tired after physical therapy and also tachycardic at 141 this morning.  No nausea vomiting chest pain fever chills.  Assessment/Plan:  Bilateral lower extremity weakness secondary to hypokalemia, suspect also contributed by chronic alcohol use/deconditioning, possible neuropathy: Electrolytes has been replaced.  She continues to work with PT OT and SNF/home health care with 24-hour supervision has been advised.  Added Neurontin, continue PRN tramadol and Tylenol.  Continue PT.    Alcohol intoxication with uncomplicated withdrawal.  Overall seems to have improved.  Not needing Ativan.  on CIWA Ativan, continue multivitamins and thiamine.  Hypertension/tachycardia: She has been running hypertensive especially diastolic blood pressures persistently over 100, also heart rate up to 14w w PT. Start on Lopressor 25 mg twice daily.  High anion gap metabolic  acidosis in the setting of alcoholic ketosis alcohol liver disease, resolved.  Increase oral intake.   ?  Infection: No respiratory symptoms, urinalysis unrevealing.  Procalcitonin was up at 0.44, chest x-ray was obtained 6/28-with slight left base atelectasis.  She is afebrile and WBC counts have improved.  Continue supportive care and continue to monitor.  Visual hallucinations:have resolved  Chronic anemia likely from etoh abuse/chronic disease. Cont folate.  Code Status: Full code  Family Communication: discussed POC with the patient in detail, available to update family if needed.  Patient plans to return to her mother's house and refusing skilled nursing facility.  Disposition Plan: Anticipate discharge in next 24  hours if vitals remain stable.  Consultants:  None  Procedures:  None  Antimicrobials:  None  DVT prophylaxis: Lovenox   Objective: Vitals:   09/21/18 0557 09/21/18 0847  BP: (!) 145/109 (!) 136/98  Pulse: (!) 102 (!) 102  Resp: 14   Temp: 98.7 F (37.1 C)   SpO2: 94% 94%    Intake/Output Summary (Last 24 hours) at 09/21/2018 1114 Last data filed at 09/20/2018 1722 Gross per 24 hour  Intake 474 ml  Output 480 ml  Net -6 ml   Filed Weights   09/18/18 0500 09/20/18 0602 09/21/18 0557  Weight: 62.4 kg 64.2 kg 62.4 kg   Body mass index is 23.61 kg/m.  Exam:  General exam: Calm, comfortable, not in acute distress, older than stated age.  HEENT:Oral mucosa moist, Ear/Nose WNL grossly, dentition normal. Respiratory system: Bilateral equal air entry, no crackles and wheezing, no use of accessory muscle, non tender on palpation. Cardiovascular system: regular rate and rhythm, tachycardic S1 & S2 heard, No JVD/murmurs. Gastrointestinal  system: Abdomen soft, non-tender, non-distended, BS +. Nervous System:Alert, awake and oriented at baseline. Able to move UE and LE, sensation intact. Extremities: No edema, distal peripheral pulses palpable.  Skin:  No rashes,no icterus. MSK: Normal muscle bulk,tone, power  Data Reviewed: CBC: Recent Labs  Lab 09/16/18 0445 09/17/18 0907 09/18/18 0400 09/19/18 0805 09/20/18 0427  WBC 9.3 11.5* 12.1* 12.5* 9.4  HGB 8.0* 8.6* 8.5* 9.0* 8.7*  HCT 24.4* 26.8* 25.8* 28.9* 27.7*  MCV 102.5* 105.5* 104.9* 105.9* 106.9*  PLT 183 211 296 282 252   Basic Metabolic Panel: Recent Labs  Lab 09/15/18 1238 09/15/18 1733 09/16/18 0445 09/17/18 0907 09/18/18 0400 09/19/18 0805 09/20/18 0427  NA 129*  --  134* 137  --  136 135  K <2.0*  --  2.6* 4.0  --  4.8 4.4  CL 71*  --  90* 97*  --  100 103  CO2 38*  --  35* 30  --  25 24  GLUCOSE 84  --  77 99  --  78 86  BUN <5*  --  5* 6  --  12 12  CREATININE 0.69 0.79 0.61 0.51  --  0.46 0.63  CALCIUM 8.5*  --  7.9* 8.4*  --  9.0 8.5*  MG 1.5*  --  2.9* 2.2 1.7 1.8  --    GFR: Estimated Creatinine Clearance: 85.6 mL/min (by C-G formula based on SCr of 0.63 mg/dL). Liver Function Tests: Recent Labs  Lab 09/15/18 1238 09/16/18 0445 09/17/18 0907 09/19/18 0805  AST 142* 124* 119* 132*  ALT 46* 39 36 37  ALKPHOS 182* 149* 145* 145*  BILITOT 0.8 0.8 1.2 1.2  PROT 6.6 5.3* 5.4* 6.4*  ALBUMIN 2.8* 2.2* 2.3* 2.7*   Recent Labs  Lab 09/15/18 1238  LIPASE 67*   Recent Labs  Lab 09/17/18 0907  AMMONIA 29   Coagulation Profile: Recent Labs  Lab 09/16/18 0445  INR 0.9   Cardiac Enzymes: No results for input(s): CKTOTAL, CKMB, CKMBINDEX, TROPONINI in the last 168 hours. BNP (last 3 results) No results for input(s): PROBNP in the last 8760 hours. HbA1C: No results for input(s): HGBA1C in the last 72 hours. CBG: No results for input(s): GLUCAP in the last 168 hours. Lipid Profile: No results for input(s): CHOL, HDL, LDLCALC, TRIG, CHOLHDL, LDLDIRECT in the last 72 hours. Thyroid Function Tests: No results for input(s): TSH, T4TOTAL, FREET4, T3FREE, THYROIDAB in the last 72 hours. Anemia Panel: No results for input(s): VITAMINB12,  FOLATE, FERRITIN, TIBC, IRON, RETICCTPCT in the last 72 hours. Urine analysis:    Component Value Date/Time   COLORURINE YELLOW 09/16/2018 0134   APPEARANCEUR CLEAR 09/16/2018 0134   LABSPEC 1.011 09/16/2018 0134   PHURINE 6.0 09/16/2018 0134   GLUCOSEU NEGATIVE 09/16/2018 0134   HGBUR NEGATIVE 09/16/2018 0134   BILIRUBINUR NEGATIVE 09/16/2018 0134   KETONESUR NEGATIVE 09/16/2018 0134   PROTEINUR NEGATIVE 09/16/2018 0134   NITRITE NEGATIVE 09/16/2018 0134   LEUKOCYTESUR NEGATIVE 09/16/2018 0134   Sepsis Labs: @LABRCNTIP (procalcitonin:4,lacticidven:4)  ) Recent Results (from the past 240 hour(s))  Novel Coronavirus,NAA,(SEND-OUT TO REF LAB - TAT 24-48 hrs); Hosp Order     Status: None   Collection Time: 09/15/18 12:07 PM   Specimen: Nasopharyngeal Swab; Respiratory  Result Value Ref Range Status   SARS-CoV-2, NAA NOT DETECTED NOT DETECTED Final    Comment: (NOTE) This test was developed and its performance characteristics determined by [facility]. This test has not been FDA cleared or approved. This  test has been authorized by FDA under an Emergency Use Authorization (EUA). This test is only authorized for the duration of time the declaration that circumstances exist justifying the authorization of the emergency use of in vitro diagnostic tests for detection of SARS-CoV-2 virus and/or diagnosis of COVID-19 infection under section 564(b)(1) of the Act, 21 U.S.C. 256LSL-3(T)(3), unless the authorization is terminated or revoked sooner. When diagnostic testing is negative, the possibility of a false negative result should be considered in the context of a patient's recent exposures and the presence of clinical signs and symptoms consistent with COVID-19. An individual without symptoms of COVID-19 and who is not shedding SARS-CoV-2 virus would expect to have a negative (not detected) result in this assay. Performed  At: [facility] [place], [place] [id number] Rush Farmer MD BW:[id number]    [place]  Final    Comment: Performed at [facility] [address], [place], [place] [address]      Studies: No results found.  Scheduled Meds: . enoxaparin (LOVENOX) injection  40 mg Subcutaneous Q24H  . folic acid  1 mg Oral Daily  . gabapentin  100 mg Oral BID  . LORazepam  0-4 mg Intravenous Q12H  . metoprolol tartrate  12.5 mg Oral BID  . multivitamin with minerals  1 tablet Oral Daily  . nicotine  21 mg Transdermal Daily  . thiamine  100 mg Oral Daily    Continuous Infusions:   LOS: 6 days     Antonieta Pert, MD [facility]  To reach me or the doctor on call, go to: [email or web address] Password [email or web address]  09/21/2018, 11:14 AM

## 2018-09-21 NOTE — Progress Notes (Signed)
Patients boyfriend, Ovid Curd, called the floor to speak with the RN. While RN was on the phone with him he was very demanding and disrespectful, insinuating that PT had not seen her and that if they had all they did was say hello and leave. RN explained that PT does not come daily usually every other day and that they leave exercises for the patient to do when they are not there, which they verify knowledge using teach back method. After insulting PT again over the phone, he proceeded to demand PT call him when they come to treat the patient today. RN explained that I would pass the message on to day shift but could not guarantee he would be called during the treatment session. Patient resting comfortably in her bed with not complaints, denies any needs. Will continue to monitor.

## 2018-09-21 NOTE — Progress Notes (Signed)
Physical Therapy Treatment Patient Details Name: Donna BaneHeather Patterson MRN: [id number] DOB: Aug 18, 1985 Today's Date: 09/21/2018    History of Present Illness Pt is a 33 y.o. female with PMH significant for chronic alcoholism and alcohol abuse who presented on 09/15/2018 with BLE weakness x1 week with fall. Admitted with severe hypokalemia and being managed for alcohol withdrawal.     PT Comments    Pt able to ambulate 175' with 2 sitting rest breaks. Her HR went up to 141 last gait session.  She is very unsteady and needs someone with her during gait and mobility.  She states that she can go to her mother's apartment at time of d/c and will have 24 hour A there.  However, she lives in a 3rd floor apartment.  She is not safe to negotiate stairs and would probably need ambulance transfer home.   Follow Up Recommendations  Home health PT;Supervision/Assistance - 24 hour;SNF     Equipment Recommendations  Rolling walker with 5" wheels    Recommendations for Other Services       Precautions / Restrictions Precautions Precautions: Fall Precaution Comments: Very unsteady, numbness in feet, LB weakness Restrictions Weight Bearing Restrictions: No    Mobility  Bed Mobility Overal bed mobility: Modified Independent Bed Mobility: Supine to Sit     Supine to sit: Modified independent (Device/Increase time)        Transfers Overall transfer level: Needs assistance Equipment used: Rolling walker (2 wheeled) Transfers: Sit to/from Stand Sit to Stand: Min assist;+2 physical assistance         General transfer comment: cues for hand placement and A to steady upon standing. Poor eccentric control standing > sitting  Ambulation/Gait Ambulation/Gait assistance: Min assist;+2 safety/equipment;Mod assist Gait Distance (Feet): 175 Feet(total- 2 seated rest breaks) Assistive device: Rolling walker (2 wheeled) Gait Pattern/deviations: Step-through pattern;Decreased dorsiflexion - right;Decreased  dorsiflexion - left;Ataxic;Narrow base of support;Wide base of support Gait velocity: decr   General Gait Details: Heavy reliance on RW. At times had very narrow BOS and other times wider.  Increased A needed for turns for safety and to stay within the RW.  She needed 2 sitting rest breaks with recliner follow.  HR up to 141 with last portion of gait.     Stairs             Wheelchair Mobility    Modified Rankin (Stroke Patients Only)       Balance           Standing balance support: Bilateral upper extremity supported;During functional activity Standing balance-Leahy Scale: Poor                              Cognition Arousal/Alertness: Awake/alert Behavior During Therapy: WFL for tasks assessed/performed Overall Cognitive Status: No family/caregiver present to determine baseline cognitive functioning                           Safety/Judgement: Decreased awareness of safety;Decreased awareness of deficits Awareness: Emergent Problem Solving: Difficulty sequencing        Exercises      General Comments General comments (skin integrity, edema, etc.): scab on R knee which she states is from a fall      Pertinent Vitals/Pain Pain Assessment: Faces Faces Pain Scale: Hurts little more Pain Location: Bilateral lower extremities Pain Descriptors / Indicators: Numbness;Grimacing Pain Intervention(s): Monitored during session;Limited activity within patient's tolerance  Home Living                      Prior Function            PT Goals (current goals can now be found in the care plan section) Acute Rehab PT Goals PT Goal Formulation: With patient Time For Goal Achievement: 10/02/18 Potential to Achieve Goals: Fair Progress towards PT goals: Progressing toward goals    Frequency    Min 3X/week      PT Plan Current plan remains appropriate    Co-evaluation PT/OT/SLP Co-Evaluation/Treatment: Yes Reason for  Co-Treatment: For patient/therapist safety PT goals addressed during session: Mobility/safety with mobility;Balance;Proper use of DME OT goals addressed during session: ADL's and self-care      AM-PAC PT "6 Clicks" Mobility   Outcome Measure  Help needed turning from your back to your side while in a flat bed without using bedrails?: None Help needed moving from lying on your back to sitting on the side of a flat bed without using bedrails?: None Help needed moving to and from a bed to a chair (including a wheelchair)?: A Lot Help needed standing up from a chair using your arms (e.g., wheelchair or bedside chair)?: A Little Help needed to walk in hospital room?: A Lot Help needed climbing 3-5 steps with a railing? : A Lot 6 Click Score: 17    End of Session Equipment Utilized During Treatment: Gait belt Activity Tolerance: Patient tolerated treatment well Patient left: in chair;with call bell/phone within reach;with chair alarm set Nurse Communication: Mobility status PT Visit Diagnosis: Muscle weakness (generalized) (M62.81);History of falling (Z91.81);Difficulty in walking, not elsewhere classified (R26.2)     Time: 0254-2706 PT Time Calculation (min) (ACUTE ONLY): 29 min  Charges:  $Gait Training: 8-22 mins                     Donna Patterson L. Donna Patterson, [place] Pager [phone] 09/21/2018    Donna Patterson 09/21/2018, 10:50 AM

## 2018-09-22 DIAGNOSIS — E876 Hypokalemia: Principal | ICD-10-CM

## 2018-09-22 LAB — CREATININE, SERUM
Creatinine, Ser: 0.58 mg/dL (ref 0.44–1.00)
GFR calc Af Amer: >60 mL/min (ref >60)
GFR calc non Af Amer: >60 mL/min (ref >60)

## 2018-09-22 MED ORDER — THIAMINE HCL 100 MG PO TABS: 100.0000 mg | ORAL_TABLET | Freq: Every day | ORAL | 0 refills | Status: AC

## 2018-09-22 MED ORDER — METOPROLOL TARTRATE 25 MG PO TABS: 25.0000 mg | ORAL_TABLET | Freq: Two times a day (BID) | ORAL | 0 refills | Status: AC

## 2018-09-22 MED ORDER — GABAPENTIN 100 MG PO CAPS: 100.0000 mg | ORAL_CAPSULE | Freq: Three times a day (TID) | ORAL | 0 refills | Status: AC

## 2018-09-22 MED ORDER — FOLIC ACID 1 MG PO TABS: 1.0000 mg | ORAL_TABLET | Freq: Every day | ORAL | 0 refills | Status: AC

## 2018-09-22 MED FILL — FOLIC ACID 1 MG TABS: 1 | 30 days supply | Qty: 30 | Fill #0

## 2018-09-22 MED FILL — METOPROLOL TARTRATE 25 MG T: 25 | 30 days supply | Qty: 60 | Fill #0

## 2018-09-22 MED FILL — GABAPENTIN 100 MG CAPSULE: 100 | 30 days supply | Qty: 90 | Fill #0

## 2018-09-22 NOTE — Discharge Summary (Signed)
Physician Discharge Summary  Donna Patterson ZOX:[id number] DOB: 1986-01-23 DOA: 09/15/2018  PCP: Patient, No Pcp Per  Admit date: 09/15/2018 Discharge date: 09/22/2018  Admitted From: Home Disposition:  Home, declined SNF placement   Recommendations for Outpatient Follow-up:  1. Follow up with PCP in 1 week. Patient to establish in [place].   Discharge Condition: Stable CODE STATUS: Full  Diet recommendation: Heart healthy   Brief/Interim Summary: Donna BaneHeather Patterson is a 33 y.o.year old femalewith medical history significant for chronic alcoholism and alcohol abuse who presented on 6/24/2020with reported bilateral leg weakness x1 week and fall. Per EMS was found to have blood pressure 96/60 requiring fluid boluses in the ED. Patient admitted for severe hypokalemia causing lower extremity weakness and now being managed for active alcohol withdrawal. Patient with slow improvement.  Seen by Physical and Occupational Therapy recommend either skilled nursing or home with home health and 24-hour supervision.  Discharge Diagnoses:  Active Problems:   Acute hypokalemia   Alcohol intoxication (HCC)   Alcohol dependence with uncomplicated withdrawal (HCC)   Hypomagnesemia   High anion gap metabolic acidosis   Alcoholic liver disease (HCC)   Hypokalemia   Visual hallucinations   Anemia   Bilateral lower extremity weakness secondary to hypokalemia, suspect also contributed by chronic alcohol use/deconditioning, possible neuropathy: Electrolytes has been replaced.  She continues to work with PT OT and SNF/home health care with 24-hour supervision has been advised.  Added Neurontin, continue PRN tramadol and Tylenol.  Continue PT.    Alcohol intoxication with uncomplicated withdrawal.  Overall seems to have improved.   Hypertension/tachycardia: Improved on Lopressor 25 mg twice daily. BP 131/88 and HR 93 this morning.   High anion gap metabolic acidosis in the setting of alcoholic ketosis  alcohol liver disease, resolved   ?Infection: No respiratory symptoms, urinalysis unrevealing. COVID 19 negative.  Procalcitonin was up at 0.44, chest x-ray was obtained 6/28-with slight left base atelectasis.  She is afebrile and WBC counts have improved.  Continue supportive care and continue to monitor.  Chronic anemia: likely from etoh abuse/chronic disease. Cont folate.   Discharge Instructions  Discharge Instructions    Call MD for:  difficulty breathing, headache or visual disturbances   Complete by: As directed    Call MD for:  extreme fatigue   Complete by: As directed    Call MD for:  persistant dizziness or light-headedness   Complete by: As directed    Call MD for:  persistant nausea and vomiting   Complete by: As directed    Call MD for:  severe uncontrolled pain   Complete by: As directed    Call MD for:  temperature >100.4   Complete by: As directed    Diet - low sodium heart healthy   Complete by: As directed    Discharge instructions   Complete by: As directed    You were cared for by a hospitalist during your hospital stay. If you have any questions about your discharge medications or the care you received while you were in the hospital after you are discharged, you can call the unit and ask to speak with the hospitalist on call if the hospitalist that took care of you is not available. Once you are discharged, your primary care physician will handle any further medical issues. Please note that NO REFILLS for any discharge medications will be authorized once you are discharged, as it is imperative that you return to your primary care physician (or establish a relationship with a  primary care physician if you do not have one) for your aftercare needs so that they can reassess your need for medications and monitor your lab values.   Increase activity slowly   Complete by: As directed      Allergies as of 09/22/2018   No Known Allergies     Medication List    TAKE  these medications   folic acid 1 MG tablet Commonly known as: FOLVITE Take 1 tablet (1 mg total) by mouth daily.   gabapentin 100 MG capsule Commonly known as: NEURONTIN Take 1 capsule (100 mg total) by mouth 3 (three) times daily.   loratadine 10 MG tablet Commonly known as: CLARITIN Take 10 mg by mouth daily.   metoprolol tartrate 25 MG tablet Commonly known as: LOPRESSOR Take 1 tablet (25 mg total) by mouth 2 (two) times daily.   multivitamin Liqd Take 5 mLs by mouth daily.   thiamine 100 MG tablet Take 1 tablet (100 mg total) by mouth daily.      Follow-up Information    Please establish with a PCP as soon as able Follow up.          No Known Allergies  Consultations:  None    Procedures/Studies: Dg Chest Port 1 View  Result Date: 09/19/2018 CLINICAL DATA:  Shortness of breath EXAM: PORTABLE CHEST 1 VIEW COMPARISON:  None. FINDINGS: There is slight atelectatic change in the left base. Lungs elsewhere are clear. Heart size and pulmonary vascularity are normal. No adenopathy. No bone lesions. IMPRESSION: Slight left base atelectasis. Lungs elsewhere clear. Cardiac silhouette within normal limits. Electronically Signed   By: Bretta BangWilliam  Woodruff III M.D.   On: 09/19/2018 16:25   Dg Abd 2 Views  Result Date: 09/17/2018 CLINICAL DATA:  Abdominal pain.  Confusion. EXAM: ABDOMEN - 2 VIEW COMPARISON:  None. FINDINGS: The bowel gas pattern is normal. There is no evidence of free air. No radio-opaque calculi or other significant radiographic abnormality is seen. No acute osseous abnormality. IMPRESSION: Negative. Electronically Signed   By: Obie DredgeWilliam T Derry M.D.   On: 09/17/2018 09:02   [place] Abdomen Limited Ruq  Result Date: 09/17/2018 CLINICAL DATA:  Abdominal pain EXAM: ULTRASOUND ABDOMEN LIMITED RIGHT UPPER QUADRANT COMPARISON:  None. FINDINGS: Gallbladder: No gallstones are appreciable. There is, however, diffuse gallbladder wall thickening with pericholecystic fluid. No  sonographic Murphy sign noted by sonographer. Common bile duct: Diameter: 4 mm. No intrahepatic or extrahepatic biliary duct dilatation. Liver: No focal lesion identified. Liver echogenicity is increased diffusely. Portal vein is patent on color Doppler imaging with normal direction of blood flow towards the liver. IMPRESSION: 1. Thickened gallbladder wall with pericholecystic fluid. No gallstones are appreciable on this study. Suspect acalculus cholecystitis. Given this circumstance, it may be prudent to correlate with nuclear medicine hepatobiliary imaging study to assess for cystic duct patency. 2. Diffuse increase in liver echogenicity, a finding felt to be indicative of hepatic steatosis. While no focal liver lesions are evident on this study, it must be cautioned that the sensitivity of ultrasound for detection of focal liver lesions is diminished in this circumstance. Electronically Signed   By: Bretta BangWilliam  Woodruff III M.D.   On: 09/17/2018 15:36      Discharge Exam: Vitals:   09/22/18 0058 09/22/18 0613  BP: (!) 134/101 131/88  Pulse:  93  Resp:  14  Temp:  98.6 F (37 C)  SpO2:  96%    General: Pt is alert, awake, not in acute distress Cardiovascular: RRR, S1/S2 +,  no rubs, no gallops Respiratory: CTA bilaterally, no wheezing, no rhonchi Abdominal: Soft, NT, ND, bowel sounds + Extremities: no edema, no cyanosis    The results of significant diagnostics from this hospitalization (including imaging, microbiology, ancillary and laboratory) are listed below for reference.     Microbiology: Recent Results (from the past 240 hour(s))  Novel Coronavirus,NAA,(SEND-OUT TO REF LAB - TAT 24-48 hrs); Hosp Order     Status: None   Collection Time: 09/15/18 12:07 PM   Specimen: Nasopharyngeal Swab; Respiratory  Result Value Ref Range Status   SARS-CoV-2, NAA NOT DETECTED NOT DETECTED Final    Comment: (NOTE) This test was developed and its performance characteristics determined by [employer] [facility]. This test has not been FDA cleared or approved. This test has been authorized by FDA under an Emergency Use Authorization (EUA). This test is only authorized for the duration of time the declaration that circumstances exist justifying the authorization of the emergency use of in vitro diagnostic tests for detection of SARS-CoV-2 virus and/or diagnosis of COVID-19 infection under section 564(b)(1) of the Act, 21 U.S.C. 213YQM-5(H)(8), unless the authorization is terminated or revoked sooner. When diagnostic testing is negative, the possibility of a false negative result should be considered in the context of a patient's recent exposures and the presence of clinical signs and symptoms consistent with COVID-19. An individual without symptoms of COVID-19 and who is not shedding SARS-CoV-2 virus would expect to have a negative (not detected) result in this assay. Performed  At: [facility] [place], [place] [id number] Rush Farmer MD UX:[id number]    [place]  Final    Comment: Performed at [facility] [address] [place], [place] [address]     Labs: BNP (last 3 results) No results for input(s): BNP in the last 8760 hours. Basic Metabolic Panel: Recent Labs  Lab 09/15/18 1238  09/16/18 0445 09/17/18 0907 09/18/18 0400 09/19/18 0805 09/20/18 0427 09/22/18 0458  NA 129*  --  134* 137  --  136 135  --   K <2.0*  --  2.6* 4.0  --  4.8 4.4  --   CL 71*  --  90* 97*  --  100 103  --   CO2 38*  --  35* 30  --  25 24  --   GLUCOSE 84  --  77 99  --  78 86  --   BUN <5*  --  5* 6  --  12 12  --   CREATININE 0.69   < > 0.61 0.51  --  0.46 0.63 0.58  CALCIUM 8.5*  --  7.9* 8.4*  --  9.0 8.5*  --   MG 1.5*  --  2.9* 2.2 1.7 1.8  --   --    < > = values in this interval not displayed.   Liver Function Tests: Recent Labs  Lab 09/15/18 1238 09/16/18 0445 09/17/18 0907 09/19/18 0805  AST 142*  124* 119* 132*  ALT 46* 39 36 37  ALKPHOS 182* 149* 145* 145*  BILITOT 0.8 0.8 1.2 1.2  PROT 6.6 5.3* 5.4* 6.4*  ALBUMIN 2.8* 2.2* 2.3* 2.7*   Recent Labs  Lab 09/15/18 1238  LIPASE 67*   Recent Labs  Lab 09/17/18 0907  AMMONIA 29   CBC: Recent Labs  Lab 09/16/18 0445 09/17/18 0907 09/18/18 0400 09/19/18 0805 09/20/18 0427  WBC 9.3 11.5* 12.1* 12.5* 9.4  HGB 8.0* 8.6* 8.5* 9.0* 8.7*  HCT 24.4* 26.8* 25.8* 28.9* 27.7*  MCV 102.5* 105.5* 104.9* 105.9* 106.9*  PLT 183 211 296 282 252   Cardiac Enzymes: No results for input(s): CKTOTAL, CKMB, CKMBINDEX, TROPONINI in the last 168 hours. BNP: Invalid input(s): POCBNP CBG: No results for input(s): GLUCAP in the last 168 hours. D-Dimer No results for input(s): DDIMER in the last 72 hours. Hgb A1c No results for input(s): HGBA1C in the last 72 hours. Lipid Profile No results for input(s): CHOL, HDL, LDLCALC, TRIG, CHOLHDL, LDLDIRECT in the last 72 hours. Thyroid function studies No results for input(s): TSH, T4TOTAL, T3FREE, THYROIDAB in the last 72 hours.  Invalid input(s): FREET3 Anemia work up No results for input(s): VITAMINB12, FOLATE, FERRITIN, TIBC, IRON, RETICCTPCT in the last 72 hours. Urinalysis    Component Value Date/Time   COLORURINE YELLOW 09/16/2018 0134   APPEARANCEUR CLEAR 09/16/2018 0134   LABSPEC 1.011 09/16/2018 0134   PHURINE 6.0 09/16/2018 0134   GLUCOSEU NEGATIVE 09/16/2018 0134   HGBUR NEGATIVE 09/16/2018 0134   BILIRUBINUR NEGATIVE 09/16/2018 0134   KETONESUR NEGATIVE 09/16/2018 0134   PROTEINUR NEGATIVE 09/16/2018 0134   NITRITE NEGATIVE 09/16/2018 0134   LEUKOCYTESUR NEGATIVE 09/16/2018 0134   Sepsis Labs Invalid input(s): PROCALCITONIN,  WBC,  LACTICIDVEN Microbiology Recent Results (from the past 240 hour(s))  Novel Coronavirus,NAA,(SEND-OUT TO REF LAB - TAT 24-48 hrs); Hosp Order     Status: None   Collection Time: 09/15/18 12:07 PM   Specimen: Nasopharyngeal Swab; Respiratory   Result Value Ref Range Status   SARS-CoV-2, NAA NOT DETECTED NOT DETECTED Final    Comment: (NOTE) This test was developed and its performance characteristics determined by [facility]. This test has not been FDA cleared or approved. This test has been authorized by FDA under an Emergency Use Authorization (EUA). This test is only authorized for the duration of time the declaration that circumstances exist justifying the authorization of the emergency use of in vitro diagnostic tests for detection of SARS-CoV-2 virus and/or diagnosis of COVID-19 infection under section 564(b)(1) of the Act, 21 U.S.C. 409WJX-9(J)(4360bbb-3(b)(1), unless the authorization is terminated or revoked sooner. When diagnostic testing is negative, the possibility of a false negative result should be considered in the context of a patient's recent exposures and the presence of clinical signs and symptoms consistent with COVID-19. An individual without symptoms of COVID-19 and who is not shedding SARS-CoV-2 virus would expect to have a negative (not detected) result in this assay. Performed  At: [facility] [address] [place], [place] [id number] Jolene SchimkeNagendra Sanjai MD YQ:[id number]:[phone]    Coronavirus Source NASOPHARYNGEAL  Final    Comment: Performed at [facility], [address] [place], [place] [id number]     Patient was seen and examined on the day of discharge and was found to be in stable condition. Time coordinating discharge: 35 minutes including assessment and coordination of care, as well as examination of the patient.   SIGNED:  Noralee StainJennifer Branton Einstein, DO [facility] [email or web address] 09/22/2018, 8:41 AM

## 2018-09-28 ENCOUNTER — Telehealth (INDEPENDENT_AMBULATORY_CARE_PROVIDER_SITE_OTHER): Payer: Self-pay

## 2018-09-28 NOTE — Telephone Encounter (Signed)
COVID-19 Ambulatory Phone Screening:    Are you currently experiencing fever, cough, or shortness of breath? No    If no to above: Are you currently experiencing chills, sore throat, new headache, loss of taste or smell, or body aches not attributable to physical activity? No

## 2018-09-29 ENCOUNTER — Encounter (INDEPENDENT_AMBULATORY_CARE_PROVIDER_SITE_OTHER): Payer: Self-pay | Admitting: Internal Medicine

## 2018-09-29 ENCOUNTER — Telehealth (INDEPENDENT_AMBULATORY_CARE_PROVIDER_SITE_OTHER): Payer: Self-pay

## 2018-09-29 ENCOUNTER — Ambulatory Visit (INDEPENDENT_AMBULATORY_CARE_PROVIDER_SITE_OTHER): Payer: Medicaid Other | Admitting: Internal Medicine

## 2018-09-29 VITALS — BP 112/79 | HR 125 | Temp 98.9°F | Ht 64.0 in | Wt 124.0 lb

## 2018-09-29 DIAGNOSIS — R531 Weakness: Secondary | ICD-10-CM

## 2018-09-29 DIAGNOSIS — M1A9XX0 Chronic gout, unspecified, without tophus (tophi): Secondary | ICD-10-CM

## 2018-09-29 DIAGNOSIS — E785 Hyperlipidemia, unspecified: Secondary | ICD-10-CM

## 2018-09-29 DIAGNOSIS — F1020 Alcohol dependence, uncomplicated: Secondary | ICD-10-CM

## 2018-09-29 NOTE — Progress Notes (Signed)
Have you seen any specialists/other providers since your last visit with us?    No    Arm preference verified?   Yes    The patient is due for PCMH care plan letter

## 2018-09-29 NOTE — Telephone Encounter (Signed)
I faxed request for hospital discharge summary to [facility] facility   Phone [phone]  Fax [phone]

## 2018-09-29 NOTE — Patient Instructions (Signed)
Alcohol Addiction    Does your drinking harm yourself or others? Or has it led to other problems with your daily life? If so, you may be addicted to alcohol.  You may have what's called an alcohol use disorder. Your healthcare provider may make this diagnosis if you have had at least 2 of these problems in a year:   You drink alcohol in larger amounts or for a longer period than you planned.   You often want to cut down or control how much you drink. Or you have often failed to do so.   You spend a lot of time getting alcohol, using it, or recovering from its use.   You crave or have a strong desire or urge to drink.   Your drinking makes it hard for you to be responsible at work, school, or home.   You keep on drinking even though you have had problems in relationships or social settings because of it.   You give up or miss important social, work, or other activities because of your drinking.   You drink alcohol at times when it's not physically safe, such as drinking then driving.   You keep on drinking even though you know it has caused physical or emotional problems.   You need more and more alcohol to get the same effects.   You hide how much you drink from family and friends.   You have withdrawal symptoms or use alcohol to avoid such symptoms.  [employer] last reviewed this educational content on 04/25/2015   2000-2020 [employer] [address], [place] [address]. All rights reserved. This information is not intended as a substitute for professional medical care. Always follow your healthcare professional's instructions.

## 2018-09-29 NOTE — Progress Notes (Signed)
Subjective:      Patient ID: Erin Huber is a 33 y.o. female  Chief Complaint   Patient presents with    Leg Pain     Pt reports bilateral leg weakness/pain. Pt is unsteady on her feet. Pt was recently hospitalized for hypocalcemia, abnormal electrolytes.       Presents with mom who contributes to history.      Extremity Weakness    The pain is present in the left lower leg, left upper leg, right upper leg and right lower leg. This is a new problem. The current episode started more than 1 month ago. There has been no history of extremity trauma. The problem occurs constantly. The problem has been unchanged. Associated symptoms include numbness (in legs and stomach) and tingling. Pertinent negatives include no inability to bear weight, joint locking or joint swelling. The symptoms are aggravated by standing. She has tried OTC pain meds and rest for the symptoms. The treatment provided no relief.     Legs "not working." has told mom said for several months that they feel weak. Moved to [place] with bf in May. When she left here it started. She was told to go to a doctor, but didn't. Then 2-3 weeks ago she fell/collapsed. Then finally 2 weeks ago called 911. She was told that she had low   Now having pain and swelling in feet/ankles.     She was prescribed folic acid, gabapentin, toprol and thiamine.   Mom said she was going through withdrawal. Felt that it was likely alcohol related. She was there for a week.     No longer on paxil for anxiety.     Takes verapamil for dizziness. More recently put on toprol for HTN. Unclear if she should be taking verapamil.     Pt would like to go back to [place] with bf, but mom wants to keep her home.     The following portions of the patient's history were reviewed and updated as appropriate: current medications, allergies, past family history, past medical history, past social history, past surgical history and problem list.     Review of Systems   Cardiovascular: Positive for leg  swelling (in feet).   Musculoskeletal: Positive for arthralgias and extremity weakness.   Neurological: Positive for tingling and numbness (in legs and stomach).         BP 112/79    Pulse (!) 125    Temp 98.9 F (37.2 C)    Ht 1.626 m (5\' 4" )    Wt 56.2 kg (124 lb)    SpO2 97%    BMI 21.28 kg/m    Objective:   Physical Exam   Constitutional: She is oriented to person, place, and time. She appears well-developed and well-nourished. No distress.   Eyes: Conjunctivae are normal.   Cardiovascular: Normal rate, regular rhythm and normal heart sounds.   Pulmonary/Chest: Effort normal and breath sounds normal.   Neurological: She is alert and oriented to person, place, and time. She has normal strength. She displays atrophy. She displays no tremor. No cranial nerve deficit. She displays a negative Romberg sign. Gait abnormal.   Reflex Scores:       Bicep reflexes are 2+ on the right side and 2+ on the left side.       Patellar reflexes are 1+ on the right side and 1+ on the left side.       Achilles reflexes are 1+ on the right side and 1+  on the left side.  Normal rapid alternating movements and heel to shin.    Psychiatric: She has a normal mood and affect.   Nursing note and vitals reviewed.      Assessment:     1. Weakness  Neurology Referral: Marinda Elk, MD ([place])    Ambulatory referral to Physical Therapy   2. Alcoholism  Vitamin B1, Plasma    Vitamin B12    Neurology Referral: Marinda Elk, MD ([place])   3. Hyperlipidemia, unspecified hyperlipidemia type  CBC without differential    Comprehensive metabolic panel    Lipid panel   4. Chronic gout without tophus, unspecified cause, unspecified site  Uric acid         Plan:     33 y.o. female with alcohol dependency presents for weakness that has progressed over several months. She was recently admitted to hospital in [place] for a week. No d/c summary available at this time. Suspect Wernicke's encephalopathy. Unclear if she had ocular involvement. mom said  she didn't remember a large portion of the hospitalization, so may have had encephalopathy. She was discharged on thiamine.   -check labs as ordered  -refer to neuro  -refer to PT  -strongly encouraged alcohol treatment program such as [facility]. Counseled on risk of worsening with continued alcohol use. Congratulated her on her abstinence since hospital d/c.  -continue gabapentin, consider titrating  -obtain d/c summary from hospitalization  -d/c verapamil for now since it was not on d/c med list from hospital  -continue toprol  -counseled on signs/symptoms to warrant re-evaluation     Return in about 4 weeks (around 10/27/2018) for follow up current symptoms.     Gust Brooms, MD

## 2018-09-30 LAB — LIPID PANEL
Cholesterol / HDL Ratio: 6.6 ratio — ABNORMAL HIGH (ref 0.0–4.4)
Cholesterol: 258 mg/dL — ABNORMAL HIGH (ref 100–199)
HDL: 39 mg/dL — ABNORMAL LOW (ref >39)
LDL Calculated: 188 mg/dL — ABNORMAL HIGH (ref 0–99)
Triglycerides: 156 mg/dL — ABNORMAL HIGH (ref 0–149)
VLDL Calculated: 31 mg/dL (ref 5–40)

## 2018-09-30 LAB — URIC ACID: Uric acid: 5.5 mg/dL (ref 2.5–7.1)

## 2018-09-30 LAB — COMPREHENSIVE METABOLIC PANEL
ALT: 97 IU/L — ABNORMAL HIGH (ref 0–32)
AST (SGOT): 174 IU/L — ABNORMAL HIGH (ref 0–40)
Albumin/Globulin Ratio: 1.1 — ABNORMAL LOW (ref 1.2–2.2)
Albumin: 4.2 g/dL (ref 3.8–4.8)
Alkaline Phosphatase: 130 IU/L — ABNORMAL HIGH (ref 39–117)
BUN / Creatinine Ratio: 27 — ABNORMAL HIGH (ref 9–23)
BUN: 19 mg/dL (ref 6–20)
Bilirubin, Total: 0.3 mg/dL (ref 0.0–1.2)
CO2: 20 mmol/L (ref 20–29)
Calcium: 10.9 mg/dL — ABNORMAL HIGH (ref 8.7–10.2)
Chloride: 98 mmol/L (ref 96–106)
Creatinine: 0.71 mg/dL (ref 0.57–1.00)
EGFR: 111 mL/min/{1.73_m2} (ref >59)
EGFR: 129 mL/min/{1.73_m2} (ref >59)
Globulin, Total: 3.8 g/dL (ref 1.5–4.5)
Glucose: 112 mg/dL — ABNORMAL HIGH (ref 65–99)
Potassium: 5.4 mmol/L — ABNORMAL HIGH (ref 3.5–5.2)
Protein, Total: 8 g/dL (ref 6.0–8.5)
Sodium: 136 mmol/L (ref 134–144)

## 2018-09-30 LAB — CBC
Hematocrit: 36 % (ref 34.0–46.6)
Hemoglobin: 11.7 g/dL (ref 11.1–15.9)
MCH: 32.4 pg (ref 26.6–33.0)
MCHC: 32.5 g/dL (ref 31.5–35.7)
MCV: 100 fL — ABNORMAL HIGH (ref 79–97)
Platelets: 586 10*3/uL — ABNORMAL HIGH (ref 150–450)
RBC: 3.61 x10E6/uL — ABNORMAL LOW (ref 3.77–5.28)
RDW: 15.1 % (ref 11.7–15.4)
WBC: 11.7 10*3/uL — ABNORMAL HIGH (ref 3.4–10.8)

## 2018-09-30 LAB — VITAMIN B12: Vitamin B-12: 677 pg/mL (ref 232–1245)

## 2018-10-01 ENCOUNTER — Telehealth (INDEPENDENT_AMBULATORY_CARE_PROVIDER_SITE_OTHER): Payer: Self-pay | Admitting: Internal Medicine

## 2018-10-01 NOTE — Telephone Encounter (Signed)
Patient is requesting to go over her lab results. Please advise.    Patient can be reached at [phone]    Thank you

## 2018-10-04 ENCOUNTER — Telehealth: Payer: Self-pay | Admitting: Internal Medicine

## 2018-10-04 LAB — VITAMIN B1, WHOLE BLOOD: Whole Blood Vitamin B1: 208.4 nmol/L — ABNORMAL HIGH (ref 66.5–200.0)

## 2018-10-04 NOTE — Telephone Encounter (Signed)
Pt called again to ask for a call back to hear about her lab results    Pt phone# [phone]

## 2018-10-05 ENCOUNTER — Ambulatory Visit (HOSPITAL_BASED_OUTPATIENT_CLINIC_OR_DEPARTMENT_OTHER): Payer: Medicaid Other | Admitting: Neurology

## 2018-10-05 NOTE — Telephone Encounter (Signed)
See result note, this was done.

## 2018-10-07 ENCOUNTER — Emergency Department
Admission: EM | Admit: 2018-10-07 | Discharge: 2018-10-07 | Disposition: A | Discharge status: Home / Self Care | Payer: Medicaid Other | Attending: Emergency Medical Services | Admitting: Emergency Medical Services

## 2018-10-07 ENCOUNTER — Encounter (HOSPITAL_BASED_OUTPATIENT_CLINIC_OR_DEPARTMENT_OTHER): Payer: Self-pay | Admitting: Neurology

## 2018-10-07 ENCOUNTER — Ambulatory Visit (INDEPENDENT_AMBULATORY_CARE_PROVIDER_SITE_OTHER): Payer: Medicaid Other | Admitting: Neurology

## 2018-10-07 ENCOUNTER — Emergency Department: Payer: Medicaid Other

## 2018-10-07 VITALS — BP 129/88 | HR 96 | Resp 20 | Ht 64.0 in | Wt 124.0 lb

## 2018-10-07 DIAGNOSIS — R200 Anesthesia of skin: Secondary | ICD-10-CM | POA: Insufficient documentation

## 2018-10-07 DIAGNOSIS — R270 Ataxia, unspecified: Secondary | ICD-10-CM | POA: Insufficient documentation

## 2018-10-07 DIAGNOSIS — F1020 Alcohol dependence, uncomplicated: Secondary | ICD-10-CM | POA: Insufficient documentation

## 2018-10-07 DIAGNOSIS — F17210 Nicotine dependence, cigarettes, uncomplicated: Secondary | ICD-10-CM | POA: Insufficient documentation

## 2018-10-07 DIAGNOSIS — R531 Weakness: Secondary | ICD-10-CM | POA: Insufficient documentation

## 2018-10-07 DIAGNOSIS — F1029 Alcohol dependence with unspecified alcohol-induced disorder: Secondary | ICD-10-CM

## 2018-10-07 DIAGNOSIS — R29898 Other symptoms and signs involving the musculoskeletal system: Secondary | ICD-10-CM

## 2018-10-07 LAB — CELL COUNT CSF TUBE #1
CSF RBC Count Tube #1: 30 /mm3 — ABNORMAL HIGH (ref 0–3)
CSF WBC Count Tube #1: 3 /mm3 (ref 0–5)

## 2018-10-07 LAB — COMPREHENSIVE METABOLIC PANEL
ALT: 56 U/L — ABNORMAL HIGH (ref 0–55)
AST (SGOT): 47 U/L — ABNORMAL HIGH (ref 5–34)
Albumin/Globulin Ratio: 1.1 (ref 0.9–2.2)
Albumin: 3.6 g/dL (ref 3.5–5.0)
Alkaline Phosphatase: 96 U/L (ref 37–106)
Anion Gap: 9 (ref 5.0–15.0)
BUN: 18 mg/dL (ref 7–19)
Bilirubin, Total: 0.3 mg/dL (ref 0.2–1.2)
CO2: 22 mEq/L (ref 22–29)
Calcium: 9.7 mg/dL (ref 8.5–10.5)
Chloride: 106 mEq/L (ref 100–111)
Creatinine: 0.6 mg/dL (ref 0.6–1.0)
Globulin: 3.4 g/dL (ref 2.0–3.6)
Glucose: 90 mg/dL (ref 70–100)
Potassium: 4.3 mEq/L (ref 3.5–5.1)
Protein, Total: 7 g/dL (ref 6.0–8.3)
Sodium: 137 mEq/L (ref 136–145)

## 2018-10-07 LAB — CBC AND DIFFERENTIAL
Absolute NRBC: 0 10*3/uL (ref 0.00–0.00)
Basophils Absolute Automated: 0.08 10*3/uL (ref 0.00–0.08)
Basophils Automated: 0.8 %
Eosinophils Absolute Automated: 0.22 10*3/uL (ref 0.00–0.44)
Eosinophils Automated: 2.1 %
Hematocrit: 33.8 % — ABNORMAL LOW (ref 34.7–43.7)
Hgb: 11 g/dL — ABNORMAL LOW (ref 11.4–14.8)
Immature Granulocytes Absolute: 0.04 10*3/uL (ref 0.00–0.07)
Immature Granulocytes: 0.4 %
Lymphocytes Absolute Automated: 2.91 10*3/uL (ref 0.42–3.22)
Lymphocytes Automated: 28.4 %
MCH: 32.8 pg (ref 25.1–33.5)
MCHC: 32.5 g/dL (ref 31.5–35.8)
MCV: 100.9 fL — ABNORMAL HIGH (ref 78.0–96.0)
MPV: 10.5 fL (ref 8.9–12.5)
Monocytes Absolute Automated: 0.87 10*3/uL — ABNORMAL HIGH (ref 0.21–0.85)
Monocytes: 8.5 %
Neutrophils Absolute: 6.12 10*3/uL (ref 1.10–6.33)
Neutrophils: 59.8 %
Nucleated RBC: 0 /100 WBC (ref 0.0–0.0)
Platelets: 364 10*3/uL — ABNORMAL HIGH (ref 142–346)
RBC: 3.35 10*6/uL — ABNORMAL LOW (ref 3.90–5.10)
RDW: 15 % (ref 11–15)
WBC: 10.24 10*3/uL — ABNORMAL HIGH (ref 3.10–9.50)

## 2018-10-07 LAB — PROTEIN, CSF: CSF Protein: 34 mg/dL (ref 15–40)

## 2018-10-07 LAB — CELL COUNT CSF TUBE #4
CSF RBC Count Tube #4: 0 /mm3 (ref 0–3)
CSF WBC Count Tube #4: 1 /mm3 (ref 0–5)

## 2018-10-07 LAB — MAGNESIUM: Magnesium: 1.7 mg/dL (ref 1.6–2.6)

## 2018-10-07 LAB — ETHANOL: Alcohol: NOT DETECTED mg/dL

## 2018-10-07 LAB — GFR: EGFR: >60

## 2018-10-07 LAB — GLUCOSE CSF: CSF Glucose: 54 mg/dL (ref 40–70)

## 2018-10-07 MED ORDER — FOLIC ACID 1 MG PO TABS
1.00 mg | ORAL_TABLET | Freq: Once | ORAL | Status: AC
Start: 2018-10-07 — End: 2018-10-07
  Administered 2018-10-07: 13:00:00 1 mg via ORAL
  Filled 2018-10-07: qty 1

## 2018-10-07 MED ORDER — THIAMINE HCL 100 MG/ML IJ SOLN
100.00 mg | Freq: Once | INTRAMUSCULAR | Status: AC
Start: 2018-10-07 — End: 2018-10-07
  Administered 2018-10-07: 13:00:00 100 mg via INTRAVENOUS
  Filled 2018-10-07: qty 2

## 2018-10-07 MED ORDER — SODIUM CHLORIDE 0.9 % IV BOLUS
1000.00 mL | Freq: Once | INTRAVENOUS | Status: AC
Start: 2018-10-07 — End: 2018-10-07
  Administered 2018-10-07: 13:00:00 1000 mL via INTRAVENOUS

## 2018-10-07 MED ORDER — GABAPENTIN 100 MG PO CAPS
100.00 mg | ORAL_CAPSULE | Freq: Three times a day (TID) | ORAL | 2 refills | Status: DC
Start: 2018-10-07 — End: 2018-10-08

## 2018-10-07 NOTE — Progress Notes (Signed)
Subjective:      Patient ID: Erin Huber is a 33 y.o. female.    HPI     The patient is a pleasant 33 years old right-handed Caucasian female who is here for evaluation of bilateral leg weakness and paresthesias with difficulty walking and balancing.  The patient reports that about 3 months ago she started having tingling numbness and paresthesias in the legs followed by weakness which over the period of next few weeks got worse.  She was evaluated in [place] and her symptoms were labeled to likely secondary to alcohol abuse.  She does admit to have history of alcohol abuse drinking about 5-6 small drinks on daily basis for many years.  She was found to have very low H&H, potassium and elevated LFTs which have improved now.  The bilateral leg weakness with pain, pins-and-needles, paresthesias, burning sensation have progressed to involve up to the stomach and belly as well as the fingers.  She is using the walker to walk, has been recommended physical therapy which has not been started yet.  She denies having any sickness prior to the onset of the symptoms, she reports that about 4 months ago she was able to walk normally without having any symptoms at all.  She has been taking gabapentin 800 mg 3 times a day without any help in the pain and paresthesias.    No Known Allergies    The following portions of the patient's history were reviewed and updated as appropriate: allergies, current medications, past family history, past medical history, past social history, past surgical history and problem list.    Past Medical History:   Diagnosis Date    Anxiety      No current facility-administered medications on file prior to visit.      Current Outpatient Medications on File Prior to Visit   Medication Sig Dispense Refill    folic acid (FOLVITE) 1 MG tablet Take 1 mg by mouth daily      loratadine (CLARITIN) 10 MG tablet Take 10 mg by mouth daily      metoprolol tartrate (LOPRESSOR) 25 MG tablet Take 25 mg  by mouth 2 (two) times daily      Multiple Vitamin (MULTIVITAMIN PO) Take by mouth      Naproxen Sodium (ALEVE PO) Take by mouth      thiamine (B-1) 100 MG tablet Take 100 mg by mouth daily      verapamil (VERELAN PM) 120 MG 24 hr capsule Take 120 mg by mouth nightly      [DISCONTINUED] gabapentin (NEURONTIN) 100 MG capsule Take 100 mg by mouth 3 (three) times daily       Review of Systems   Constitutional: Positive for activity change and fatigue.   HENT: Negative.    Eyes: Negative.    Respiratory: Negative.    Cardiovascular: Negative.    Gastrointestinal: Negative.    Endocrine: Negative.    Genitourinary: Negative.    Musculoskeletal: Positive for gait problem.   Skin: Negative.    Allergic/Immunologic: Negative.    Neurological: Positive for weakness and numbness.   Hematological: Negative.    Psychiatric/Behavioral: Positive for decreased concentration, dysphoric mood and sleep disturbance. The patient is nervous/anxious.     All other systems were reviewed and are negative except as previously note din the HPI.          Family History   Problem Relation Age of Onset    Cancer Maternal Grandmother  Drug abuse Sister      Objective:     Vitals:    10/07/18 1009   BP: 129/88   Pulse: 96   Resp: 20   Weight: 56.2 kg (124 lb)   Height: 1.626 m (5\' 4" )     Neurologic Exam    Constitutional: Vital signs reviewed.   Head: Normocephalic, atraumatic, neck supple no JVD  Eyes: No conjunctival injection. No discharge.   ENT: Mucous membranes moist   Neck: Normal range of motion. Non tender.   Respiratory/Chest: Clear to auscultation. No respiratory distress.   Cardiovascular: Regular rate and rhythm. No murmur.   Lower Extremity: No edema. No cyanosis.   Skin: Warm and dry. No rash.   Lymphatic: No cervical lymphadenopathy.   Psychiatric: Normal affect.     Mental Status: The patient was awake, alert, and oriented. Conversation   was appropriate. Speech was fluent, and the patient followed commands    consistently.  Cranial Nerves: Pupils were equally round and reactive to light. Extraocular  movements were intact, without nystagmus. The patients face was symmetric,   and facial sensation was intact. Tongue and palate were midline.   Motor Exam: The patient had weakness in both lower extremities. Tone and bulk appeared   normal. There were no abnormal movements or tremor noted.   Sensation: Reduced light touch, vibration, pinprick up to the mid abdomen.    Coordination: Finger-to-nose was intact   without dysmetria. Romberg testing was positive.  Gait: Difficulty standing up on her own, requiring walker for stability, antalgic gait.  Deep Tendon Reflexes: Reduced to absent in the ankles and knees bilaterally  Toes were downgoing.    Assessment:     33 years old female with a history of heavy alcohol abuse presenting with bilateral lower extremity weakness that seems to be progressing with sensory level up to the mid abdomen.  The patient reports that up to 4 months ago she had no trouble and was able to walk or climb stairs without any trouble.  Apparently the symptoms have been labeled to alcohol which she has stopped but need to rule out autoimmune etiology like acute demyelinating neuropathy.  The option of further work-up including spinal tap, MRI of the spine as well as the nerve conduction studies was suggested.  The patient is okay to get the spinal tap in the emergency room today.  If the spinal tap is negative then will get the MRI lumbar and thoracic spine as outpatient.  We will increase the dose of gabapentin to 300 mg 3 times a day.  She will need physical therapy possibly acute rehabilitation if possible  She was strongly recommended to cut down and totally stop the alcohol back may in part be because of the neuropathy but with a concern of symptoms starting 3 months ago with progressive weakness, may need to rule out other etiologies.      ICD-10-CM    1. Bilateral leg weakness R29.898 MRI  Lumbar Spine WO Contrast     MRI Thoracic Spine WO Contrast     CSF IgG Synthesis Rate     Oligoclonal Bands CSF     CSF Protein     CSF Glucose     CSF Cell Count Tube #1     Culture + Gram Stain,Aerobic, CSF       Plan:       The patient will return in follow-up as outlined above. If there are any new neurologic symptoms or worsening  of current symptoms, the patient is instructed to contact us by telephone.    Jackquline Denmark, MD  Neurology, Neurophysiology  [facility]

## 2018-10-07 NOTE — ED Triage Notes (Signed)
Ongoing bilateral foot pain, weakness and numbness in legs and falls for the past 3 weeks. Sent by neurologies for further eval. HX of etoh. Denies fever cough

## 2018-10-07 NOTE — ED Provider Notes (Signed)
EMERGENCY DEPARTMENT HISTORY AND PHYSICAL EXAM    Date: 10/07/18  Patient Name: Erin Huber  Attending Physician: Alexander-Nicholas D. Mckenzie Bove, MD      History of Presenting Illness     Chief Complaint:   Chief Complaint   Patient presents with    Extremity Weakness    Numbness            Historian:  Patient and mother    33 y.o. female with alcohol dependence, last EtOH use 3 weeks ago (since last hospitalization), c/o >1 month of progressive numbness/weakness in bilateral lower extremities.  "burning sensation to feet".  Ataxia getting worse.  Notes onset of above S&S when she moved to [facility] in May with bf.  She was hospitalized in [place] and was told she had metabolic derangement.  She was seen by Dr. Otilio Jefferson (PCP) on 7/8 and was noted to have mild withdrawal.  Pt referred to Neuro which pt saw today (Dr. Chales Abrahams) who ordered MRI of T and L spine and sent to ED for lumbar puncture/CSF testing. Of note, pt denies visual disturbance.  But mother notes 2 nights ago pt was intermittently confused and ataxic, falling 6 times.  Unknown head trauma. Pt denies headache, diplopia, focal weakness.  No nausea/vomiting.  No tremors, anxiety. No visual or auditory hallucinations.      PMD:  Mariann Laster, MD      Nursing Notes Review   Nursing Notes were reviewed.     Previous Records Review   Previous records were reviewed to the extent practicable for the current presentation.      Review of Systems     Constitutional:  No fever  Eyes: No discharge   ENT: No ST  CV:  No CP   Resp:  No SOB or cough  GI: No abd pain, N, V, D  GU: No dysuria  MS:  Weakness as per HPI; +mild muscle wasting(symmetric)  Skin: No rash  Neuro:  As per HPI  Psych:  No behavior changes  All other systems reviewed and negative    See HPI for review of systems that is relevant to the current presentation.   All other systems reviewed: negative.     Past Medical History     Past Medical History:   Diagnosis Date    Anxiety        Past  Surgical History     Past Surgical History:   Procedure Laterality Date    WISDOM TOOTH EXTRACTION Bilateral 2008       Family History     Family History   Problem Relation Age of Onset    Cancer Maternal Grandmother     Drug abuse Sister        Social History     Social History     Socioeconomic History    Marital status: Single     Spouse name: Not on file    Number of children: Not on file    Years of education: Not on file    Highest education level: Not on file   Occupational History    Not on file   Social Needs    Financial resource strain: Not on file    Food insecurity     Worry: Not on file     Inability: Not on file    Transportation needs     Medical: Not on file     Non-medical: Not on file   Tobacco Use  Smoking status: Current Every Day Smoker     Packs/day: 1.00     Types: Cigarettes    Smokeless tobacco: Never Used   Substance and Sexual Activity    Alcohol use: Yes     Comment: Drinks 8-10 shots of liquor, 1 bottle of wine and 3-6 hard llemonades daily    Drug use: No    Sexual activity: Yes     Partners: Male   Lifestyle    Physical activity     Days per week: Not on file     Minutes per session: Not on file    Stress: Not on file   Relationships    Social connections     Talks on phone: Not on file     Gets together: Not on file     Attends religious service: Not on file     Active member of club or organization: Not on file     Attends meetings of clubs or organizations: Not on file     Relationship status: Not on file    Intimate partner violence     Fear of current or ex partner: Not on file     Emotionally abused: Not on file     Physically abused: Not on file     Forced sexual activity: Not on file   Other Topics Concern    Not on file   Social History Narrative    Not on file       Allergies     No Known Allergies    Home Medications     Home medications reviewed by ED MD at 12:22 PM    Current/Home Medications    FOLIC ACID (FOLVITE) 1 MG TABLET    Take 1 mg by  mouth daily    GABAPENTIN (NEURONTIN) 100 MG CAPSULE    Take 1 capsule (100 mg total) by mouth 3 (three) times daily    LORATADINE (CLARITIN) 10 MG TABLET    Take 10 mg by mouth daily    METOPROLOL TARTRATE (LOPRESSOR) 25 MG TABLET    Take 25 mg by mouth 2 (two) times daily    MULTIPLE VITAMIN (MULTIVITAMIN PO)    Take by mouth    NAPROXEN SODIUM (ALEVE PO)    Take by mouth    THIAMINE (B-1) 100 MG TABLET    Take 100 mg by mouth daily    VERAPAMIL (VERELAN PM) 120 MG 24 HR CAPSULE    Take 120 mg by mouth nightly       ED Medications Administered     ED Medication Orders (From admission, onward)    Start Ordered     Status Ordering Provider    10/07/18 1233 10/07/18 1232  sodium chloride 0.9 % bolus 1,000 mL  Once     Route: Intravenous  Ordered Dose: 1,000 mL     Last MAR action:  New Bag Brindy Higginbotham, ALEXANDER-NICHOLAS D    10/07/18 1233 10/07/18 1232  folic acid (FOLVITE) tablet 1 mg  Once     Route: Oral  Ordered Dose: 1 mg     Last MAR action:  Given Jaken Fregia, ALEXANDER-NICHOLAS D    10/07/18 1233 10/07/18 1232  thiamine (B-1) injection 100 mg  Once     Route: Intravenous  Ordered Dose: 100 mg     Last MAR action:  Given Kelin Borum, ALEXANDER-NICHOLAS D            VS     Patient Vitals for the past  24 hrs:   BP Temp Temp src Pulse Resp SpO2 Weight   10/07/18 1358 115/74   81  98 %    10/07/18 1135 97/77 99.3 F (37.4 C) Oral 90 18 99 %    10/07/18 1133       59 kg       Physical Exam     Constitutional: Vital signs reviewed. Well appearing.  Head: Normocephalic, atraumatic  Eyes: Conjunctiva and sclera are normal.  No injection or discharge. PERL, EOMI, no nystagmus  Ears, Nose, Throat:  Normal external examination of the nose and ears.    Neck: Normal range of motion. Trachea midline.   Respiratory/Chest:  No respiratory distress.     Cardiovascular: Regular rate and rhythm.  Abdomen: Soft and non-tender. No masses.  No rebound or guarding.  Back:     Upper Extremity:  No edema. No cyanosis.  Lower  Extremity:  No edema. No cyanosis.  NEURO:     Cranial nerves: EOMI, PERL, nl visual fields, V1/V2/V3 intact to fine touch, facial muscles intact, uvula and palate midline, symmetric hearing to finger rub, nl left/right SCM function/strength, no shoulder droop, nl shoulder elevation, nl tongue protrusion.  Motor: 5+ UE and LE, flexors and extensors symmetric.   Speech is fluent without slurring  Sensation: Grossly intact to fine touch UE and LE sx.   Cerebellar: No tremor noted; no dysmetria  Tone: normal bulk and tone in upper and lower extremities. No atrophy noted.  Gait: wide-based gait; no foot drop; normal Romberg (negative)  Skin: Warm and dry. No rash.  Psychiatric:  Normal affect.  Normal insight.        Diagnostic Study Results     ECG:      Labs     Results     Procedure Component Value Units Date/Time    Preliminary Gram Stain [[id number]] Collected:  10/07/18 1335    Specimen:  CSF (Spinal Fluid Lumbar Drain) Updated:  10/07/18 1432    Narrative:       ORDER#: [id number]                                    ORDERED BY: Margarette Canada  SOURCE: CSF (Spinal Fluid Lumbar Drain) Tube 4       COLLECTED:  10/07/18 13:35  ANTIBIOTICS AT COLL.:                                RECEIVED :  10/07/18 14:10  Preliminary Gram Stain                     FINAL       10/07/18 14:32  10/07/18   Rare WBCs             No organisms seen             Stain performed on Cytospin (concentrated) specimen             This is a preliminary result. The gram stain will be reviewed             by the Microbiology Department at the [facility].             See final result under Stain, Gram.      CSF Cell Count Tube #1 [[id number]]  (Abnormal) Collected:  10/07/18 1335  Specimen:  CSF (Lumbar Puncture Spinal Fluid) Updated:  10/07/18 1431     CSF WBC Count Tube #1 3 /cumm      CSF RBC Count Tube #1 30 /cumm     CSF Cell Count Tube #4 [[id number]] Collected:  10/07/18 1335    Specimen:  CSF (Lumbar Puncture Spinal Fluid) Updated:   10/07/18 1431     CSF WBC Count Tube #4 1 /cumm      CSF RBC Count Tube #4 0 /cumm     CSF Glucose [[id number]] Collected:  10/07/18 1335    Specimen:  Cerebrospinal Fluid from CSF (Lumbar Puncture Spinal Fluid) Updated:  10/07/18 1412     CSF Glucose 54 mg/dL     CSF Protein [[id number]] Collected:  10/07/18 1335    Specimen:  Cerebrospinal Fluid from CSF (Lumbar Puncture Spinal Fluid) Updated:  10/07/18 1412     CSF Protein 34 mg/dL      CSF Spun Appearance Colorless    CSF IgG Synthesis Rate [[id number]] Collected:  10/07/18 1335     Updated:  10/07/18 1344    Culture + Gram Stain,Aerobic, CSF [[id number]] Collected:  10/07/18 1335    Specimen:  Cerebrospinal Fluid from CSF (Lumbar Puncture Spinal Fluid) Updated:  10/07/18 1336    Narrative:       Use middle tube    Comprehensive metabolic panel [[id number]]  (Abnormal) Collected:  10/07/18 1202    Specimen:  Blood Updated:  10/07/18 1229     Glucose 90 mg/dL      BUN 18 mg/dL      Creatinine 0.6 mg/dL      Sodium 536 mEq/L      Potassium 4.3 mEq/L      Chloride 106 mEq/L      CO2 22 mEq/L      Calcium 9.7 mg/dL      Protein, Total 7.0 g/dL      Albumin 3.6 g/dL      AST (SGOT) 47 U/L      ALT 56 U/L      Alkaline Phosphatase 96 U/L      Bilirubin, Total 0.3 mg/dL      Globulin 3.4 g/dL      Albumin/Globulin Ratio 1.1     Anion Gap 9.0    Ethanol (Alcohol) Level [[id number]] Collected:  10/07/18 1202    Specimen:  Blood Updated:  10/07/18 1229     Alcohol None Detected mg/dL     Magnesium [[id number]] Collected:  10/07/18 1202    Specimen:  Blood Updated:  10/07/18 1229     Magnesium 1.7 mg/dL     GFR [[id number]] Collected:  10/07/18 1202     Updated:  10/07/18 1229     EGFR >60.0    CBC and differential [[id number]]  (Abnormal) Collected:  10/07/18 1202    Specimen:  Blood Updated:  10/07/18 1209     WBC 10.24 x10 3/uL      Hgb 11.0 g/dL      Hematocrit 60.1 %      Platelets 364 x10 3/uL      RBC 3.35 x10 6/uL      MCV 100.9 fL      MCH 32.8 pg      MCHC 32.5 g/dL       RDW 15 %      MPV 10.5 fL      Neutrophils 59.8 %      Lymphocytes Automated 28.4 %      Monocytes  8.5 %      Eosinophils Automated 2.1 %      Basophils Automated 0.8 %      Immature Granulocytes 0.4 %      Nucleated RBC 0.0 /100 WBC      Neutrophils Absolute 6.12 x10 3/uL      Lymphocytes Absolute Automated 2.91 x10 3/uL      Monocytes Absolute Automated 0.87 x10 3/uL      Eosinophils Absolute Automated 0.22 x10 3/uL      Basophils Absolute Automated 0.08 x10 3/uL      Immature Granulocytes Absolute 0.04 x10 3/uL      Absolute NRBC 0.00 x10 3/uL           Radiologic Studies  Radiology Results (24 Hour)     Procedure Component Value Units Date/Time    CT Head WO Contrast [[id number]] Collected:  10/07/18 1305    Order Status:  Completed Updated:  10/07/18 1307    Narrative:       HISTORY: Pain after fall.    Technique: Noncontrast CT of the head. The following dose reduction  techniques were utilized: automatic exposure control and/or adjustment  of mA and/or kV according to patient size, and the use of iterative  reconstruction technique.    Comparison: None.    Findings: There is no evidence of acute territorial infarction or  intracranial hemorrhage. The ventricles and sulci are normal in size and  configuration. There is no mass effect or midline shift. No extra-axial  collections are seen. The globes and orbits appear unremarkable. The  visualized paranasal sinuses and mastoid air cells are clear.      Impression:        No acute intracranial abnormality.    Georgann Housekeeper, MD   10/07/2018 1:05 PM      .    Data Review     Laboratory results reviewed by ED provider:  Yes  Radiologic study results reviewed by ED provider:  Yes    MDM and Clinical Notes     The patient was seen and evaluated during the time of COVID-19 pandemic.  Significant limitations can be present in the evaluation and management of ED patients during pandemic conditions, including but not limited to lack of testing, rapidly changing IHS  protocols, limited evaluation and follow-up resources.         DDx includes SDH, Wernicke's encephalopathy, T/L spine mass effect, demyelinating process such as GBS/CIDP.  Will check labs, head CT, and perform lumbar puncture for CSF analysis. I do not believe pt had meningitis/encephalitis.  Doubt tick borne disease.      I d/w Dr. Chales Abrahams, who has already ordered MRI of T and L spine.  He notes if CSF protein is normal, then patient can be discharged to have MRI done as outpatient per his plan.  If CSF protein is abnormal, plan to admit for presumed demyelinated disease process (GBS) and for MRI to be done during hospitalization.      Lumbar Puncture  Date/Time: 10/07/2018 1:30 PM  Performed by: Terance Ice, Alexander-Nicholas D, MD  Authorized by: Terance Ice Alexander-Nicholas D, MD   Consent: Verbal consent obtained. Written consent obtained.  Risks and benefits: risks, benefits and alternatives were discussed  Consent given by: patient  Patient understanding: patient states understanding of the procedure being performed  Patient consent: the patient's understanding of the procedure matches consent given  Procedure consent: procedure consent matches procedure scheduled  Relevant documents: relevant documents present and verified  Test  results: test results available and properly labeled  Site marked: the operative site was marked  Imaging studies: imaging studies available  Patient identity confirmed: verbally with patient and arm band  Time out: Immediately prior to procedure a "time out" was called to verify the correct patient, procedure, equipment, support staff and site/side marked as required.  Indications: evaluate for demylenating disease process.  Anesthesia: local infiltration    Anesthesia:  Local Anesthetic: lidocaine 1% without epinephrine  Anesthetic total: 7 mL    Sedation:  Patient sedated: no    Preparation: Patient was prepped and draped in the usual sterile fashion.  Lumbar space: L3-L4 interspace   Patient's position: sitting  Needle gauge: 22  Needle type: spinal needle - Quincke tip  Needle length: 2.5 in  Number of attempts: 2  Fluid appearance: clear  Tubes of fluid: 4  Total volume: 10 ml  Post-procedure: site cleaned and adhesive bandage applied  Patient tolerance: Patient tolerated the procedure well with no immediate complications          CSF shows normal protein levels. Also no e/o meningitis.  Cultures pending.  Pt stable to continue plan of care as outpatient. Pt agrees.    At this time, no lower back pain. Doubt cauda equina.  Will get T & L spine MRI as outpt.  Precautions given. RIW.  Close f/u with Dr. Chales Abrahams (Neurology) as plan. Pt also counseled again about alcoholism. Recommend daily multivitamin as Wernicke's can still be concern. At this time, no AMS, no opthalmoplegia.  +ataxia but able to walk without assistance.        Critical Care     Critical Care: No critical care time needed      Diagnosis and Disposition     Rendering Provider: Alexander-Nicholas D Allen Basista    Clinical Impression(s):  1. Ataxia    2. Alcohol dependence with unspecified alcohol-induced disorder        Disposition  ED Disposition     ED Disposition Condition Date/Time Comment    Discharge  Thu Oct 07, 2018  2:35 PM Erin Huber discharge to home/self care.    Condition at disposition: Stable          Prescriptions    New Prescriptions    No medications on file              Glorya Bartley, Alexander-Nicholas D, MD  10/07/18 1440

## 2018-10-07 NOTE — Discharge Instructions (Signed)
Continue with plan to have Thoracic and Lumbar Spine MRIs as ordered by Dr. Chales Abrahams.  Please call/see Neurologist as scheduled.  Continue to abstain from alcohol.  Take multivitamin daily.  Return if worse, especially for fevers, falls, urinary problems or other concerns.    Thank you for choosing [facility] for your emergency care needs.  We strive to provide EXCELLENT care to you and your family.      If you do not continue to improve or your condition worsens, please contact your doctor or return immediately to the Emergency Department.    ONSITE PHARMACY  Our full service onsite pharmacy is a 2 minute walk from the ER.  Open Mon to Fri from 8 am to 8 pm, Sat and Sun 9 am to 5 pm. Ask an ED staff member for directions.  We accept all major insurances and prices are competitive with major retailers.  Ask your provider to print your prescriptions down to the pharmacy to speed you on your way home.      DOCTOR REFERRALS  Call [phone] (available 24 hours a day, 7 days a week) if you need any further referrals and we can help you find a primary care doctor or specialist.  Also, available online at:  [email or web address]    YOUR CONTACT INFORMATION  Before leaving please check with registration to make sure we have an up-to-date contact number.  You can call registration at [phone] to update your information.  For questions about your hospital bill, please call [phone].  For questions about your Emergency Dept Physician bill please call [phone].      FREE HEALTH SERVICES  If you need help with health or social services, please call 2-1-1 for a free referral to resources in your area.  2-1-1 is a free service connecting people with information on health insurance, free clinics, pregnancy, mental health, dental care, food assistance, housing, and substance abuse counseling.  Also, available online at:  http://www.[facility].org    MEDICAL RECORDS AND  TESTS  Certain laboratory test results do not come back the same day, for example urine cultures.   We will contact you if other important findings are noted.  Radiology films are often reviewed again to ensure accuracy.  If there is any discrepancy, we will notify you.      Please call [phone] to pick up a complimentary CD of any radiology studies performed.  If you or your doctor would like to request a copy of your medical records, please call [phone].          ORTHOPEDIC INJURY   Please know that significant injuries can exist even when an initial x-ray is read as normal or negative.  This can occur because some fractures (broken bones) are not initially visible on x-rays.  For this reason, close outpatient follow-up with your primary care doctor or bone specialist (orthopedist) is required.    MEDICATIONS AND FOLLOWUP  Please be aware that some prescription medications can cause drowsiness.  Use caution when driving or operating machinery.    The examination and treatment you have received in our Emergency Department is provided on an emergency basis, and is not intended to be a substitute for your primary care physician.  It is important that your doctor checks you again and that you report any new or remaining problems at that time.      [address] [facility]  [facility] [address], [place], [place] [address] (  1.4 miles, 7 minutes)  Handout with directions available on request      ASSISTANCE WITH INSURANCE    Affordable Care Act  [facility])  Call to start or finish an application, compare plans, enroll or ask a question.  [phone]  TTY: [phone]  Web:  Healthcare.gov    Help Enrolling in [facility]  [employer] [place]  (234)335-9119 (TOLL-FREE)  [phone] (TTY)  Web:  Http://www.coverva.org    Local Help Enrolling in the [facility]  [place] [facility]  [phone] (MAIN)  Email:  health-help@nvfs .org  Web:  BlackjackMyths.is  Address:  [address], [address] [place], [place]  [address]  ;

## 2018-10-08 ENCOUNTER — Other Ambulatory Visit (HOSPITAL_BASED_OUTPATIENT_CLINIC_OR_DEPARTMENT_OTHER): Payer: Self-pay

## 2018-10-08 ENCOUNTER — Telehealth (INDEPENDENT_AMBULATORY_CARE_PROVIDER_SITE_OTHER): Payer: Self-pay | Admitting: Internal Medicine

## 2018-10-08 LAB — CSF IGG SYNTHESIS RATE
Albumin: 3620 mg/dL (ref 3200–4800)
CSF Albumin: 17.6 mg/dL (ref <=27.0)
CSF IgG Index: 0.56 (ref <=0.85)
CSF IgG/Albumin Ratio: 0.18 (ref <=0.21)
CSF IgG: 3.1 mg/dL (ref <=8.1)
CSF Synthesis Rate: 1 (ref <=12)
IgG/Albumin Ratio: 0.32 (ref <=0.40)
IgG: 1170 mg/dL (ref 767–1590)

## 2018-10-08 MED ORDER — GABAPENTIN 300 MG PO CAPS
300.00 mg | ORAL_CAPSULE | Freq: Three times a day (TID) | ORAL | 2 refills | Status: DC
Start: 2018-10-08 — End: 2018-10-11

## 2018-10-08 NOTE — Telephone Encounter (Signed)
Last filled verapamil- historical   Last o/v July 2020.  Pt has an upcoming appointment on November 05 2018 with Dr. Otilio Jefferson.  Queued up 90 day supply with 0 refills.

## 2018-10-08 NOTE — Telephone Encounter (Signed)
Her verapamil was dced during her last hospital stay. We decided to hold off on it until her next appt with me since she was not discharged with it.

## 2018-10-08 NOTE — Telephone Encounter (Signed)
Pt informed and v/u

## 2018-10-08 NOTE — Telephone Encounter (Signed)
Name, strength, directions of requested refill(s):  verapamil (VERELAN PM) 120 MG 24 hr capsule    Pharmacy to send refill to or patient to pick up rx from office (mark requested pharmacy in BOLD):      [id number] [place], [place] - [address]  [address]  [place] [address]  Phone: [phone] Fax: [phone]    [id number] [place], [place] - [address]  [address]  [place] [address]  Phone: [phone] Fax: [phone]        Please mark "X" next to the preferred call back number:    Mobile:   Telephone Information:   Mobile [phone]        X   Home: @HOMEPHONE @    Work: @WORKPHONE @          Next visit: 10/14/2018

## 2018-10-11 ENCOUNTER — Other Ambulatory Visit (HOSPITAL_BASED_OUTPATIENT_CLINIC_OR_DEPARTMENT_OTHER): Payer: Self-pay

## 2018-10-11 MED ORDER — GABAPENTIN 300 MG PO CAPS
600.00 mg | ORAL_CAPSULE | Freq: Three times a day (TID) | ORAL | 2 refills | Status: DC
Start: 2018-10-11 — End: 2018-11-04

## 2018-10-11 NOTE — Telephone Encounter (Signed)
Patient called with complain of 7/10 pain to feet. Patient stated that she is currently taking Gabapentin (300 mg total) by mouth 3 (three) times daily with no relief. Please advise.

## 2018-10-11 NOTE — Telephone Encounter (Signed)
Neurontin 600 mg tid    Filbert Berthold

## 2018-10-12 NOTE — Telephone Encounter (Signed)
Contacted patient and made her aware that the Provider increased her gabapentin (NEURONTIN) 300 MG capsule to Take 2 capsules (600 mg total) by mouth 3 (three) times daily. Patient verbalized understanding.

## 2018-10-13 ENCOUNTER — Telehealth (INDEPENDENT_AMBULATORY_CARE_PROVIDER_SITE_OTHER): Payer: Self-pay

## 2018-10-13 NOTE — Telephone Encounter (Signed)
COVID-19 Ambulatory Phone Screening:    Are you currently experiencing fever, cough, or shortness of breath? No    If no to above: Are you currently experiencing chills, sore throat, new headache, loss of taste or smell, or body aches not attributable to physical activity? No

## 2018-10-14 ENCOUNTER — Encounter (INDEPENDENT_AMBULATORY_CARE_PROVIDER_SITE_OTHER): Payer: Self-pay | Admitting: Internal Medicine

## 2018-10-14 ENCOUNTER — Ambulatory Visit (INDEPENDENT_AMBULATORY_CARE_PROVIDER_SITE_OTHER): Payer: Medicaid Other | Admitting: Internal Medicine

## 2018-10-14 VITALS — BP 107/74 | HR 108 | Temp 97.7°F | Ht 64.0 in | Wt 130.0 lb

## 2018-10-14 DIAGNOSIS — R748 Abnormal levels of other serum enzymes: Secondary | ICD-10-CM

## 2018-10-14 DIAGNOSIS — F10282 Alcohol dependence with alcohol-induced sleep disorder: Secondary | ICD-10-CM

## 2018-10-14 DIAGNOSIS — F1020 Alcohol dependence, uncomplicated: Secondary | ICD-10-CM

## 2018-10-14 DIAGNOSIS — R531 Weakness: Secondary | ICD-10-CM

## 2018-10-14 DIAGNOSIS — D72829 Elevated white blood cell count, unspecified: Secondary | ICD-10-CM

## 2018-10-14 DIAGNOSIS — D649 Anemia, unspecified: Secondary | ICD-10-CM

## 2018-10-14 DIAGNOSIS — I10 Essential (primary) hypertension: Secondary | ICD-10-CM

## 2018-10-14 DIAGNOSIS — D473 Essential (hemorrhagic) thrombocythemia: Secondary | ICD-10-CM

## 2018-10-14 DIAGNOSIS — D75839 Thrombocytosis, unspecified: Secondary | ICD-10-CM

## 2018-10-14 DIAGNOSIS — E785 Hyperlipidemia, unspecified: Secondary | ICD-10-CM

## 2018-10-14 DIAGNOSIS — R42 Dizziness and giddiness: Secondary | ICD-10-CM

## 2018-10-14 LAB — IRON PROFILE
Iron Saturation: 16 % (ref 15–50)
Iron: 66 ug/dL (ref 40–145)
TIBC: 400 ug/dL (ref 265–497)
UIBC: 334 ug/dL (ref 126–382)

## 2018-10-14 LAB — COMPREHENSIVE METABOLIC PANEL
ALT: 57 U/L — ABNORMAL HIGH (ref 0–55)
AST (SGOT): 57 U/L — ABNORMAL HIGH (ref 5–34)
Albumin/Globulin Ratio: 1 (ref 0.9–2.2)
Albumin: 4 g/dL (ref 3.5–5.0)
Alkaline Phosphatase: 89 U/L (ref 37–106)
Anion Gap: 12 (ref 5.0–15.0)
BUN: 17 mg/dL (ref 7.0–19.0)
Bilirubin, Total: 0.2 mg/dL (ref 0.2–1.2)
CO2: 22 mEq/L (ref 21–29)
Calcium: 10.4 mg/dL (ref 8.5–10.5)
Chloride: 103 mEq/L (ref 100–111)
Creatinine: 0.8 mg/dL (ref 0.4–1.5)
Globulin: 4 g/dL — ABNORMAL HIGH (ref 2.0–3.7)
Glucose: 85 mg/dL (ref 70–100)
Potassium: 4.7 mEq/L (ref 3.5–5.1)
Protein, Total: 8 g/dL (ref 6.0–8.3)
Sodium: 137 mEq/L (ref 136–145)

## 2018-10-14 LAB — CBC AND DIFFERENTIAL
Absolute NRBC: 0 10*3/uL (ref 0.00–0.00)
Basophils Absolute Automated: 0.06 10*3/uL (ref 0.00–0.08)
Basophils Automated: 0.6 %
Eosinophils Absolute Automated: 0.28 10*3/uL (ref 0.00–0.44)
Eosinophils Automated: 2.7 %
Hematocrit: 38.4 % (ref 34.7–43.7)
Hgb: 12 g/dL (ref 11.4–14.8)
Immature Granulocytes Absolute: 0.04 10*3/uL (ref 0.00–0.07)
Immature Granulocytes: 0.4 %
Lymphocytes Absolute Automated: 2.93 10*3/uL (ref 0.42–3.22)
Lymphocytes Automated: 28.6 %
MCH: 32.3 pg (ref 25.1–33.5)
MCHC: 31.3 g/dL — ABNORMAL LOW (ref 31.5–35.8)
MCV: 103.5 fL — ABNORMAL HIGH (ref 78.0–96.0)
MPV: 11.8 fL (ref 8.9–12.5)
Monocytes Absolute Automated: 0.84 10*3/uL (ref 0.21–0.85)
Monocytes: 8.2 %
Neutrophils Absolute: 6.08 10*3/uL (ref 1.10–6.33)
Neutrophils: 59.5 %
Nucleated RBC: 0 /100 WBC (ref 0.0–0.0)
Platelets: 333 10*3/uL (ref 142–346)
RBC: 3.71 10*6/uL — ABNORMAL LOW (ref 3.90–5.10)
RDW: 15 % (ref 11–15)
WBC: 10.23 10*3/uL — ABNORMAL HIGH (ref 3.10–9.50)

## 2018-10-14 LAB — GFR: EGFR: >60

## 2018-10-14 LAB — HEMOLYSIS INDEX: Hemolysis Index: 8 (ref 0–18)

## 2018-10-14 LAB — FERRITIN: Ferritin: 257.26 ng/mL — ABNORMAL HIGH (ref 4.60–204.00)

## 2018-10-14 MED ORDER — VERAPAMIL HCL ER 120 MG PO CP24
120.00 mg | ORAL_CAPSULE | Freq: Every evening | ORAL | 0 refills | Status: DC
Start: 2018-10-14 — End: 2019-03-31

## 2018-10-14 NOTE — Progress Notes (Signed)
Subjective:      Patient ID: Erin Huber is a 33 y.o. female  Chief Complaint   Patient presents with    Repeat labs     Pt is here for discussion of labs and to get them rechecked.     alcohol use     Pt's mom reports that she drank at least 5-6 small bottles of liquor. Pt's mom is concerned because she takes an uber to the liquor store when she isn't home. This past tuesday she had another 5-6 drinks. Pt upset in room that mom is reporting this.     fuzziness in head     Pt is requesting a refill on the verapamil. Pt wants to know if she could stop the meoprolol and start the verapamil.          HPI    Problem   Essential Hypertension    Unclear diagnosis. Previous hospitalization stopped verapamil and started metoprolol. Would like to change back to verapamil.      Dizziness    Takes verapamil for years. At first was prescribed by ENT. Helps with dizziness to feel normal. Was told it was an inner ear problem but didn't give a diagnosis such as vertigo.     Since last visit: would like to go back on verapamil. Was stopped on previous hospitalization. Unclear reasoning. Would like to go back on it.      Hyperlipidemia, Unspecified Hyperlipidemia Type    High cholesterol. Started on zetia bc she was drinking at the time. Was planning to recheck lipids in March.      Since last visit: off treatment. Unclear why. Still very high, but liver enzymes abnormal.     Lab Results   Component Value Date    CHOL 258 (H) 09/29/2018    CHOL 221 (H) 06/08/2017    CHOL 236 (H) 12/22/2016     Lab Results   Component Value Date    HDL 39 (L) 09/29/2018    HDL 104 06/08/2017    HDL 17 (L) 12/22/2016     Lab Results   Component Value Date    LDL 188 (H) 09/29/2018    LDL 75 06/08/2017    LDL 187 (H) 12/22/2016     Lab Results   Component Value Date    TRIG 156 (H) 09/29/2018    TRIG 209 (H) 06/08/2017    TRIG 158 (H) 12/22/2016         Alcohol Dependence With Alcohol-Induced Sleep Disorder    History: Chronic, long standing.      09/2018: Legs "not working." has told mom said for several months that they feel weak. Moved to [place] with bf in May. When she left here it started. She was told to go to a doctor, but didn't. Then 2-3 weeks ago she fell/collapsed. Then finally 2 weeks ago called 911. She was told that she had low   Now having pain and swelling in feet/ankles.   She was prescribed folic acid, gabapentin, toprol and thiamine.   Mom said she was going through withdrawal. Felt that it was likely alcohol related. She was there for a week.     Since last visit: Saw neuro and had LP that was normal. Planning on MRIs has them scheduled this weekend. Suspect due to alcohol but ruling out autoimmune condition.   Pt doesn't feel that the gabapentin is making much of a difference. Was just increased to 600mg  TID in the past few days.  Doesn't have pain during the day, but worse at night. Going to PT twice a week. Feels like she has a little strength back.   Has had some drinks recently when mom wasn't home. Had some left over and realized she shouldn't have done it. Not in a program and doesn't desire to be in one at this time.           The following portions of the patient's history were reviewed and updated as appropriate: current medications, allergies, past family history, past medical history, past social history, past surgical history and problem list.     Review of Systems   Constitutional: Negative.    Respiratory: Negative.    Cardiovascular: Negative.    Neurological: Positive for weakness and numbness.         BP 107/74    Pulse (!) 108    Temp 97.7 F (36.5 C) (Temporal)    Ht 1.626 m (5\' 4" )    Wt 59 kg (130 lb)    LMP  (LMP Unknown)    SpO2 98%    BMI 22.31 kg/m    Objective:   Physical Exam   Constitutional: She is oriented to person, place, and time. She appears well-developed and well-nourished. No distress.   Eyes: Conjunctivae are normal.   Cardiovascular: Normal rate, regular rhythm and normal heart sounds.    Pulmonary/Chest: Effort normal and breath sounds normal.   Neurological: She is alert and oriented to person, place, and time.   Psychiatric: She has a normal mood and affect.   Nursing note and vitals reviewed.    Lab Results   Component Value Date    WBC 10.23 (H) 10/14/2018    HGB 12.0 10/14/2018    HCT 38.4 10/14/2018    MCV 103.5 (H) 10/14/2018    PLT 333 10/14/2018      Lab Results   Component Value Date    ALT 57 (H) 10/14/2018    AST 57 (H) 10/14/2018    GGT 1,393 (H) 12/22/2016    ALKPHOS 89 10/14/2018    BILITOTAL 0.2 10/14/2018     Lab Results   Component Value Date    CREAT 0.8 10/14/2018    BUN 17.0 10/14/2018    NA 137 10/14/2018    K 4.7 10/14/2018    CL 103 10/14/2018    CO2 22 10/14/2018       Assessment:     1. Weakness     2. Alcoholism     3. Hyperlipidemia, unspecified hyperlipidemia type     4. Elevated liver enzymes  Comprehensive metabolic panel   5. Dizziness  verapamil (VERELAN PM) 120 MG 24 hr capsule   6. Anemia, unspecified type  CBC and differential    Ferritin    IRON PROFILE   7. Leukocytosis, unspecified type  CBC and differential   8. Thrombocytosis  CBC and differential   9. Alcohol dependence with alcohol-induced sleep disorder     10. Essential hypertension           Plan:     33 y.o. female with alcohol dependency presents for weakness that has progressed over several months. Recently stabilized and improving with PT.  -check labs as ordered  -continue work up per neuro with MRI this weekend   -continue PT  -once again strongly encouraged alcohol treatment program such as [facility]. Counseled on risk of worsening with continued alcohol use. Cautioned on risk with continued alcohol use and improved success with being  part of treatment program  -continue gabapentin, titrating per neuro  -still awaiting d/c summary from hospitalization  -resume verapamil   -d/c toprol  -will likely need treatment for hyperlipidemia, but will follow up labs due to abnormal liver enzymes for  now  -monitor home bp  -counseled on signs/symptoms to warrant re-evaluation     Return in about 4 weeks (around 11/11/2018) for follow up current symptoms as needed.     Gust Brooms, MD

## 2018-10-14 NOTE — Progress Notes (Signed)
Have you seen any specialists/other providers since your last visit with [place]?    Yes, neuro Dr. Chales Abrahams.     Arm preference verified?   Yes    The patient is due for [facility] care plan letter.

## 2018-10-14 NOTE — Telephone Encounter (Signed)
Refill sent in separate enc.

## 2018-10-14 NOTE — Patient Instructions (Signed)
Recovering from Addiction: Coping with Relapse  Drug and alcohol abuse changes the brain in ways that continue long after the abuse ends. Because of this, people who are addicted to drugs or alcohol are at risk for relapse. This is true even after long periods of staying drug- or alcohol-free.Like other chronic diseases, substance addiction needs to be managed daily. A person who is addicted to drugs or alcohol needs a plan to help prevent, or manage, a relapse.  You will need ongoing treatment and support. Your best chance for long-term recovery will likely be a combination of medicines and behavioral therapy. Other tips include:  Stick with your treatment plan. It may seem like you've recovered and you don't need to keep taking steps to stay drug- or alcohol-free. Your chances of staying sober are much higher if you continue treatment after you recover. If you are thinking about stopping treatment, talk to a professional first.  Get help immediately if you use the drug again. If you start using again, talk with your healthcare provider or someone else who can help you right away.  Get loved ones involved in your recovery. A form of therapy called community reinforcement and family training focuses on counseling and training for your family. The therapist teaches your family how to help motivate you to seek treatment. This therapy also helps the family recognize situations that may lead you to drink or use drugs.Other family oriented recovery programs, such as Alcoholics Anonymous and Al-Anon, are available in communities nationwide.  Learn healthy ways to cope with stress, anger, boredom, or other triggers. Behavioral therapy can help you learn ways for coping with drug or alcohol cravings. It can also teach you ways to avoid drugs and prevent relapse, and help you deal with relapse if it occurs.  StayWell last reviewed this educational content on 04/25/2015   2000-2020 [employer], [place]. [address], [place], [place] [address]. All rights reserved. This information is not intended as a substitute for professional medical care. Always follow your healthcare professional's instructions.        Understanding the Disease of Addiction  What is addiction?  Addiction is a long-lasting (chronic) disease of the brain. Addiction to substances such as drugs and alcohol may be called substance use disorder. It affects how your brain learns and works. Your genes and your environment can affect your risk for addiction. A family history of addiction also raises your risk. But anyone can have an addiction. Unfortunately, many people falsely think that addiction is a moral weakness. They think that people addicted to drugs or alcohol are just behaving badly or making poor choices.   How does addiction affect my brain?  Whether you start using drugs or alcohol is your choice. But once your brain is exposed to the addictive substance, your brain begins to change. This is especially true if you are more at risk for addiction. These brain changes overpower your self-control. This happens because the substance overexcites the brains reward center. The substance mimics the brain's own natural feel-good chemicals. The brain is rewired into believing that the substance is a good thing and that you need it to survive. This rewiring is very strong. Over time, you no longer find pleasure in other things you once enjoyed. The addiction is more powerful.   If you keep using the substance, your brain makes less of its own feel-good chemicals. You then must keep using drugs or alcohol to try to make up for the  low levels of the brain chemicals. Over time your brain needs more and more of the drug or alcohol to achieve this. You need the drug. You no longer think about the physical, emotional, and social harm it causes.   Can you become addicted to things other than drugs or alcohol?  Addiction can happen in response to other pleasurable  things that stimulate the brains reward center. These things include eating, having sex, gambling, using tobacco, and using the internet.   Can you get control over a brain disease?  The only way to get over an addiction is to stop using the substance. Not using it lets your brain recover and go back to its normal functioning. You can relearn how to find pleasure in other things again. But your brain will always be at risk for addiction. Addiction is very powerful. So you often will need ongoing comprehensive medical and psychotherapy help and social support for long-term success.   Addiction is a chronic condition. Its common for people who are recovering from addiction to start using the substance again (called a relapse). This doesnt mean that treatment doesnt work. Just like other chronic health conditions, addiction requires ongoing treatment that changes as the persons needs change.    StayWell last reviewed this educational content on 03/24/2018   2000-2020 [employer], [place]. [address], [place], [place] [address]. All rights reserved. This information is not intended as a substitute for professional medical care. Always follow your healthcare professional's instructions.        Addiction: Your Treatment Options  No single treatment for addiction works for everyone. The treatment that's best for you can depend on many factors. For many people, treatment may be a combination of medicine, behavior change, therapy, lifestyle changes, and support.  Medicines  Medicines can help with withdrawal symptoms. They can also reduce cravings for the addictive substance. They can blunt its feel-good effects. For example:   Methadone, buprenorphine, and naltrexone are used for heroin and other opioid addiction.   Acamprosate, disulfiram, and naltrexone are used for treating alcohol addiction.   Bupropion, varenicline, or nicotine replacement therapy can help with nicotine addiction.  These medicines have  proved to be quite helpful for people trying to overcome an addiction.    Behavior change treatment   Motivational Interviewing. This is a type of counseling that encourages you to change your behavior. The goal is to explore and resolve any mixed feelings you have about quitting drug or alcohol use. The therapist helps you figure out and focus on your personal reasons for wanting to change.    Cognitive behavioral therapy (CBT). In this therapy, you figure out your problem behaviors. And you learn ways to change those behaviors. For example, if anger or stress makes you want to drink, a therapist can help you learn healthy ways to manage those feelings.   Community reinforcement approach (CRA). This therapy uses vouchers help you follow a drug- or alcohol-free lifestyle. With each clean urine sample, you get a voucher to use for a reward. This helps you stay drug- or alcohol-free while you learn new life skills.   Community reinforcement and family training (CRAFT). This therapy counsels and trains your family. The therapist teaches them how to motivate you to seek or continue treatment. This therapy also helps your family recognize family situations that may encourage you to drink or use drugs.   Mutual support groups. These groups are run voluntarily by non-health careprofessional people.Their purpose is to  support each other emotionally and socially by sharing their experiences with substance abuse and mentoring others through the recovery process. Many of these groups are based on the 12-step recovery model, and have sessions available for every type of addiction(for example, AA or Alcoholics Anonymous; NA or Narcotics Anonymous).   Individualized drug counseling (IDC). This commonly practiced form of therapy typically incorporate the disease model of addiction and the spiritual dimension of recovery while also focusing on behavioral change through participation in 12-step programs.  StayWell last  reviewed this educational content on 04/25/2015   2000-2020 [employer], [place]. [address] [place], [place] [address]. All rights reserved. This information is not intended as a substitute for professional medical care. Always follow your healthcare professional's instructions.

## 2018-10-16 ENCOUNTER — Ambulatory Visit: Payer: Medicaid Other

## 2018-10-16 ENCOUNTER — Ambulatory Visit: Payer: Medicaid Other | Attending: Neurology

## 2018-10-16 DIAGNOSIS — R29898 Other symptoms and signs involving the musculoskeletal system: Secondary | ICD-10-CM

## 2018-10-16 DIAGNOSIS — M5136 Other intervertebral disc degeneration, lumbar region: Secondary | ICD-10-CM | POA: Insufficient documentation

## 2018-10-16 DIAGNOSIS — M5126 Other intervertebral disc displacement, lumbar region: Secondary | ICD-10-CM | POA: Insufficient documentation

## 2018-10-20 ENCOUNTER — Telehealth (INDEPENDENT_AMBULATORY_CARE_PROVIDER_SITE_OTHER): Payer: Self-pay | Admitting: Internal Medicine

## 2018-10-20 NOTE — Telephone Encounter (Signed)
Patient called regarding recent results for Labs on 07/23. Please return call at noted phone number below.      Patient Preferred Callback Number: [phone]

## 2018-10-20 NOTE — Telephone Encounter (Signed)
Patient is calling in regards to her lab results.

## 2018-10-20 NOTE — Telephone Encounter (Signed)
Reviewed labs

## 2018-10-21 ENCOUNTER — Other Ambulatory Visit (HOSPITAL_BASED_OUTPATIENT_CLINIC_OR_DEPARTMENT_OTHER): Payer: Self-pay | Admitting: Neurology

## 2018-10-21 ENCOUNTER — Telehealth (HOSPITAL_BASED_OUTPATIENT_CLINIC_OR_DEPARTMENT_OTHER): Payer: Self-pay

## 2018-10-21 DIAGNOSIS — R29898 Other symptoms and signs involving the musculoskeletal system: Secondary | ICD-10-CM

## 2018-10-21 NOTE — Telephone Encounter (Signed)
Tried calling her, she need to see Neurosurgery, there is minor annular tear. Referral in epic    Erin Huber

## 2018-10-21 NOTE — Telephone Encounter (Signed)
Patient called to get the results of her recent MRI. Please advise.

## 2018-10-27 ENCOUNTER — Encounter (INDEPENDENT_AMBULATORY_CARE_PROVIDER_SITE_OTHER): Payer: Self-pay

## 2018-10-29 ENCOUNTER — Other Ambulatory Visit (HOSPITAL_BASED_OUTPATIENT_CLINIC_OR_DEPARTMENT_OTHER): Payer: Self-pay

## 2018-10-29 ENCOUNTER — Ambulatory Visit (INDEPENDENT_AMBULATORY_CARE_PROVIDER_SITE_OTHER): Payer: Medicaid Other | Admitting: Neurology

## 2018-10-29 DIAGNOSIS — M5126 Other intervertebral disc displacement, lumbar region: Secondary | ICD-10-CM

## 2018-10-29 DIAGNOSIS — R29898 Other symptoms and signs involving the musculoskeletal system: Secondary | ICD-10-CM

## 2018-10-29 DIAGNOSIS — G629 Polyneuropathy, unspecified: Secondary | ICD-10-CM

## 2018-10-29 MED ORDER — DULOXETINE HCL 30 MG PO CPEP
30.00 mg | ORAL_CAPSULE | Freq: Two times a day (BID) | ORAL | 1 refills | Status: DC
Start: 2018-10-29 — End: 2018-11-05

## 2018-10-29 NOTE — Procedures (Signed)
PROCEDURE NOTE    [facility]    Procedures  [facility] Einar Gip  [address], [address]  [place], [place] [address]  [phone]    Nerve Conduction & EMG Report  EMG Machine #1  ________________________________________________________________________          Patient: Erin Huber Age: 33 Years 10 Months  MEDICAL RECORD NUMBER: [id number] Diabetes?: N  Sex: Female Anticoagulant?: N  Height: 5 feet 4 inch Pacemaker: N  Weight: 124 lbs Physician: Dr Lynann Bologna  Date of Birth: July 07, 1985      Summary    The motor conduction test was performed on 4 nerve(s). The results were normal in 1 nerve(s): L TIBIAL (KNEE) - AH. Results outside the specified normal range were found in 3 nerve(s), as follows:   In the R COMM PERONEAL - EDB study  o the take off velocity result was reduced for Knee-Fib Head segment   In the L COMM PERONEAL - EDB study  o the take off velocity result was reduced for Fib Head-Ankle segment   In the R TIBIAL (KNEE) - AH study  o the take off velocity result was reduced for Knee-Ankle segment    The sensory conduction test was performed on 4 nerve(s). The results were normal in 2 nerve(s): R SURAL, L SURAL. Results outside the specified normal range were found in 2 nerve(s), as follows:   In the R SUP PERONEAL study  o the peak latency result was increased for Lat leg stimulation  o the peak amplitude result was reduced for Lat leg stimulation   In the L SUP PERONEAL study  o the peak latency result was increased for Lat leg stimulation  o the peak amplitude result was reduced for Lat leg stimulation    The needle EMG examination was performed in 12 muscles. It was normal in 11 muscle(s): L. TIB ANTERIOR, L. GASTROCN (MED), R. GASTROCN (MED), L. VAST MEDIALIS, R. VAST MEDIALIS, L. QUADRICEPS, R. QUADRICEPS, L. PERON LONGUS, R. PERON LONGUS, R. GLUTEUS MED, R. LUMB PSP (L). The study was abnormal in 1 muscle(s), with the following distribution:   The R.  TIB ANTERIOR had abnormal spontaneous activity.        Impression: Abnormal study.  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  There is acute denervation noted at tibialis anterior which may suggest L5 radiculopathy in relation to the annular tear and disc protrusion at L4-L5, but denervation was not noted in other L5 mediated dermatome.  Clinical correlation is recommended.    Lynann Bologna, MD  Neurology, Neurophysiology  [facility]                Sensory NCS      Nerve / Sites Rec. Site Onset Lat Peak Lat Ref. NP Amp Ref. PP Amp Segments Distance Onset Vel Ref. Temp.     ms ms ms V V V  cm m/s m/s C   R SURAL      Calf Lat Mall 2.71 3.54 ?4.20 8.7 ?7.9 4.2 Calf - Lat Mall 14 51.7 ?42.0 22.8   L SURAL      Calf Lat Mall 3.02 3.85 ?4.20 8.3 ?7.9 4.5 Calf - Lat Mall 14 46.3 ?42.0 22.8   R SUP PERONEAL      Lat leg Ankle 3.02 3.65 ?3.30 6.9 ?13.9 0.53 Lat leg - Ankle 12 39.7  22.8   L SUP PERONEAL  Lat leg Ankle 3.07 3.80 ?3.30 5.4 ?13.9 1.3 Lat leg - Ankle 12 39.1  22.9               Motor NCS      Nerve / Sites Rec. Site Lat Ref. Amp Ref. Seq Amp Ref. Segments Dist. Vel Ref. Temp     ms ms mV mV % %  cm m/s m/s C   R COMM PERONEAL - EDB      Ankle EDB 4.17 ?6.60 4.0 ?1.9 100  Ankle - EDB 9   22.7      Fib Head EDB 9.84  3.0  75.5 ?70 Fib Head - Ankle 26 46 ?41 22.7      Knee EDB 12.50  3.1  103 ?115 Knee - Fib Head 10 38 ?39 22.7   L COMM PERONEAL - EDB      Ankle EDB 5.31 ?6.60 3.0 ?1.9 100  Ankle - EDB 9   22.8      Fib Head EDB 11.20  2.9  99.1 ?115 Fib Head - Ankle 24 41 ?41 22.8      Knee EDB 12.76  2.9  99.1 ?115 Knee - Fib Head 9 58 ?39 22.8   R TIBIAL (KNEE) - AH      Ankle AH 3.85 ?7.00 6.5 ?2.9 100  Ankle - AH 11   22.7      Knee AH 12.66  6.2  95.5 ?115 Knee - Ankle 35 40 ?40 22.8   L TIBIAL (KNEE) - AH      Ankle AH 3.44 ?7.00 8.9 ?2.9 100  Ankle - AH 11   22.9      Knee AH 12.34  6.6  74.1 ?50 Knee - Ankle 36 40 ?40 22.9                EMG Summary Table     Spontaneous MUAP Recruitment Activation    IA Fib PSW Fasc Amp Dur. Morphology Pattern Effort   L. TIB ANTERIOR N None None None N N Normal N Good   R. TIB ANTERIOR N None 1+ None N N Normal N Good   L. GASTROCN (MED) N None None None N N Normal N Good   R. GASTROCN (MED) N None None None N N Normal N Good   L. VAST MEDIALIS N None None None N N Normal N Good   R. VAST MEDIALIS N None None None N N Normal N Good   L. QUADRICEPS N None None None N N Normal N Good   R. QUADRICEPS N None None None N N Normal N Good   L. PERON LONGUS N None None None N N Normal N Good   R. PERON LONGUS N None None None N N Normal N Good   R. GLUTEUS MED N None None None N N Normal N Good   R. LUMB PSP (L) N None None None N N Normal N Good          Electronically Signed: Lynann Bologna, MD

## 2018-11-02 ENCOUNTER — Telehealth (HOSPITAL_BASED_OUTPATIENT_CLINIC_OR_DEPARTMENT_OTHER): Payer: Self-pay

## 2018-11-02 NOTE — Telephone Encounter (Signed)
Patient called to find out the results of EMG. Patient would also like to go back on gabapentin (NEURONTIN) 300 MG capsule, Take 2 capsule three times daily. Patient does not like DULoxetine (Cymbalta) 30 MG capsule.

## 2018-11-03 NOTE — Telephone Encounter (Signed)
Letter in epic

## 2018-11-04 ENCOUNTER — Other Ambulatory Visit (HOSPITAL_BASED_OUTPATIENT_CLINIC_OR_DEPARTMENT_OTHER): Payer: Self-pay

## 2018-11-04 MED ORDER — GABAPENTIN 300 MG PO CAPS
600.00 mg | ORAL_CAPSULE | Freq: Three times a day (TID) | ORAL | 2 refills | Status: DC
Start: 2018-11-04 — End: 2018-11-11

## 2018-11-05 ENCOUNTER — Other Ambulatory Visit (HOSPITAL_BASED_OUTPATIENT_CLINIC_OR_DEPARTMENT_OTHER): Payer: Self-pay

## 2018-11-05 ENCOUNTER — Encounter (HOSPITAL_BASED_OUTPATIENT_CLINIC_OR_DEPARTMENT_OTHER): Payer: Self-pay

## 2018-11-05 ENCOUNTER — Ambulatory Visit (INDEPENDENT_AMBULATORY_CARE_PROVIDER_SITE_OTHER): Payer: Medicaid Other | Admitting: Internal Medicine

## 2018-11-05 NOTE — Progress Notes (Addendum)
[facility]  New Patient Note    Referring MD:    Primary Care MD: Erin Huber    MRN: [id number]    HPI     [name] is a 33 year-old female presenting today with bilateral lower extremity pain and weakness. Present today with her mother. Recently relocated from [facility], recent history of ETOH abuse, completed rehabilitation program. Symptoms have been present for approximately three months, s/p completion of her rehabilitation program. Symptoms occur intermittently. Characterized as pins and needles sensation, when she lays down she feels like she is unable to feel her legs. Currently not taking any medications.No aggravating factors. Recently had EMG study of bilateral lower extremities with Dr. Chales Abrahams, MRI of Thoracic and Lumbar spine.     Patient ID: Erin Huber is a 33 y.o. right handed female, DOB 03/05/1986.    Huber History     Past Huber History:   Diagnosis Date    Anxiety        Surgical History     Past Surgical History:   Procedure Laterality Date    WISDOM TOOTH EXTRACTION Bilateral 2008       Family History     Family History   Problem Relation Age of Onset    Cancer Maternal Grandmother     Drug abuse Sister         Social History     Social History     Socioeconomic History    Marital status: Single     Spouse name: Not on file    Number of children: Not on file    Years of education: Not on file    Highest education level: Not on file   Occupational History    Not on file   Social Needs    Financial resource strain: Not on file    Food insecurity     Worry: Not on file     Inability: Not on file    Transportation needs     Huber: Not on file     Non-Huber: Not on file   Tobacco Use    Smoking status: Current Every Day Smoker     Packs/day: 1.00     Types: Cigarettes    Smokeless tobacco: Never Used   Substance and Sexual Activity    Alcohol use: Yes     Comment: Drinks 8-10 shots of liquor, 1 bottle of wine and 3-6 hard llemonades daily     Drug use: No    Sexual activity: Yes     Partners: Male   Lifestyle    Physical activity     Days per week: Not on file     Minutes per session: Not on file    Stress: Not on file   Relationships    Social connections     Talks on phone: Not on file     Gets together: Not on file     Attends religious service: Not on file     Active member of club or organization: Not on file     Attends meetings of clubs or organizations: Not on file     Relationship status: Not on file    Intimate partner violence     Fear of current or ex partner: Not on file     Emotionally abused: Not on file     Physically abused: Not on file     Forced sexual activity: Not on file   Other Topics  Concern    Not on file   Social History Narrative    Not on file       Current Medications     Current Outpatient Medications   Medication Sig Dispense Refill    folic acid (FOLVITE) 1 MG tablet Take 1 mg by mouth daily      gabapentin (NEURONTIN) 300 MG capsule Take 2 capsules (600 mg total) by mouth 3 (three) times daily 180 capsule 2    loratadine (CLARITIN) 10 MG tablet Take 10 mg by mouth daily      Multiple Vitamin (MULTIVITAMIN PO) Take by mouth      Naproxen Sodium (ALEVE PO) Take by mouth      thiamine (B-1) 100 MG tablet Take 100 mg by mouth daily      verapamil (VERELAN PM) 120 MG 24 hr capsule Take 1 capsule (120 mg total) by mouth nightly 90 capsule 0     Current Facility-Administered Medications   Medication Dose Route Frequency Provider Last Rate Last Dose    alias med E5 injection 380 mg  380 mg Intramuscular Q28 Days Miquel Dunn, RN   380 mg at 03/02/17 1415       Allergies   No Known Allergies    Review of Systems   Review of Systems   Constitutional: Positive for activity change.   HENT:   BALANCE DISTURBANCES   Eyes: Negative.   Respiratory: Negative.   Cardiovascular: Negative.   Gastrointestinal: Negative.   Endocrine: Negative.   Genitourinary: Negative.   Musculoskeletal: Positive for back pain, gait problem,  joint swelling and myalgias.   Skin: Negative.   Allergic/Immunologic: Negative.   Neurological: Positive for dizziness, weakness and numbness.   Hematological: Negative.   Psychiatric/Behavioral: The patient is nervous/anxious.     Constant numbness feet.   Constant/intermittent pins/needles/tingling feet.   Intermittent sharp pain legs and feet.   Constant dull/achy pain legs and feet.   Constant weakness legs.      Physical Examination   VITAL SIGNS:   vitals were not taken for this visit.          General: The patient was well developed and well nourished.  No acute distress. Cooperative with the examination  Neck: Trachea midline, no thyromegaly  Pulmonary: normal respiratory effort. No audible wheezing.   Cardiovascular: no diaphoresis   Abdomen: Soft, non-distended  Extremities: No pedal edema, normal in color  Skin: Normal temperature, no rash  Mental Status: The patient is awake, alert and oriented to person, place, and time.    Affect is normal. Fund of knowledge appropriate.   Recent and remote memory are intact. Attention span and concentration appear normal.  No delusions or hallucinations. Language function is normal.   There is no evidence of aphasia in conversational speech.  Cranial nerves:   -CN II: Visual fields full to bedside confrontation   -CN III, IV, VI: Pupils equal, round, and reactive to light; extraocular movements intact; no ptosis                                          -CN V: Facial sensation intact in V1 through V3 distributions   -CN VII: Face symmetric   -CN VIII: Hearing intact to conversational speech   -CN IX, X: Palate elevates symmetrically; normal phonation   -CN XI: Symmetric full strength of sternocleidomastoid and trapezius muscles   -  CN XII: Tongue protrudes midline  Motor: Muscle tone normal without spasticity or flaccidity. No atrophy.    No pronator drift.     Motor: Deltoid  Bicep Tricep Grip IO   Right upper ext  5 5 5 5 5    Left upper ext   5 5 5 5 5       Motor:  iliopsoas Quad Ham dorsi plantar   Right lower ext  5 5 4  4+ 4   Left lower ext 5 5 4  4+ 4   Sensory:   Light touch intact.  Proprioception intact  Reflexes:  DTRs 1+, symmetric  No Hoffmann's sign bilaterally  No Clonus bilaterally  Coordination: FTN intact. No tremors  Gait: Station normal, gait stable   Tandem walk intact.   Romberg negative.  GCS 15    Radiology Interpretation   Ct Head Wo Contrast    Result Date: 10/07/2018  HISTORY: Pain after fall. Technique: Noncontrast CT of the head. The following dose reduction techniques were utilized: automatic exposure control and/or adjustment of mA and/or kV according to patient size, and the use of iterative reconstruction technique. Comparison: None. Findings: There is no evidence of acute territorial infarction or intracranial hemorrhage. The ventricles and sulci are normal in size and configuration. There is no mass effect or midline shift. No extra-axial collections are seen. The globes and orbits appear unremarkable. The visualized paranasal sinuses and mastoid air cells are clear.      No acute intracranial abnormality. Georgann Housekeeper, MD  10/07/2018 1:05 PM    Mri Thoracic Spine Wo Contrast    Result Date: 10/16/2018  HISTORY: Lower extremity weakness bilaterally TECHNIQUE: Multiplanar, multisequence MR imaging of the thoracic and lumbar spine was performed without intravenous contrast. The examination was performed on a 3 Tesla system. COMPARISON: No prior studies are available. FINDINGS: In the thoracic spine, vertebral body heights are maintained. There is no listhesis. The thoracic spinal cord is normal in caliber and signal intensity. No focal intramedullary lesions are identified. There is no canal stenosis or neural foraminal encroachment. In the lumbar spine, vertebral body heights are preserved. There is normal osseous alignment. The conus medullaris terminates at a normal level. At L4-L5, there is disc desiccation with diffuse disc bulge. There is an  annular tear and superimposed mild, shallow central disc protrusion that minimally indents the thecal sac. The neural foramina remain patent. There is no significant disc pathology elsewhere in the lumbar spine.      1. The thoracic spinal cord is normal in caliber and signal intensity, and the conus medullaris terminates at a normal level. 2. There is no significant canal stenosis in the thoracolumbar spine. 3. 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 Nicoletta Dress, MD  10/16/2018 3:17 PM    Mri Lumbar Spine Wo Contrast    Result Date: 10/16/2018  HISTORY: Lower extremity weakness bilaterally TECHNIQUE: Multiplanar, multisequence MR imaging of the thoracic and lumbar spine was performed without intravenous contrast. The examination was performed on a 3 Tesla system. COMPARISON: No prior studies are available. FINDINGS: In the thoracic spine, vertebral body heights are maintained. There is no listhesis. The thoracic spinal cord is normal in caliber and signal intensity. No focal intramedullary lesions are identified. There is no canal stenosis or neural foraminal encroachment. In the lumbar spine, vertebral body heights are preserved. There is normal osseous alignment. The conus medullaris terminates at a normal level. At L4-L5, there is disc desiccation with diffuse disc bulge. There is  an annular tear and superimposed mild, shallow central disc protrusion that minimally indents the thecal sac. The neural foramina remain patent. There is no significant disc pathology elsewhere in the lumbar spine.      1. The thoracic spinal cord is normal in caliber and signal intensity, and the conus medullaris terminates at a normal level. 2. There is no significant canal stenosis in the thoracolumbar spine. 3. 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 Nicoletta Dress, MD  10/16/2018 3:17 PM      Impression/Plan     We reviewed the results of the EMG as well as MRI of thoracic and lumbar spine.  There  is mild to moderate disc degeneration at L4-L5, however she is likely experiencing neuropathy, could be potentially from a vitamin B12 deficiency secondary to EtOH use.  There is no indication for surgery at this time.  Patient was advised to follow-up with a registered dietitian.  Follow-up     Follow-up PRN    Marlaine Hind, MD

## 2018-11-06 ENCOUNTER — Ambulatory Visit (INDEPENDENT_AMBULATORY_CARE_PROVIDER_SITE_OTHER): Payer: Medicaid Other | Admitting: Neurological Surgery

## 2018-11-06 ENCOUNTER — Encounter (INDEPENDENT_AMBULATORY_CARE_PROVIDER_SITE_OTHER): Payer: Self-pay | Admitting: Neurological Surgery

## 2018-11-06 VITALS — BP 112/82 | HR 98 | Temp 97.9°F | Ht 64.0 in | Wt 134.4 lb

## 2018-11-06 DIAGNOSIS — G629 Polyneuropathy, unspecified: Secondary | ICD-10-CM

## 2018-11-06 NOTE — Progress Notes (Addendum)
Review of Systems   Constitutional: Positive for activity change.   HENT:        BALANCE DISTURBANCES   Eyes: Negative.    Respiratory: Negative.    Cardiovascular: Negative.    Gastrointestinal: Negative.    Endocrine: Negative.    Genitourinary: Negative.    Musculoskeletal: Positive for back pain, gait problem, joint swelling and myalgias.   Skin: Negative.    Allergic/Immunologic: Negative.    Neurological: Positive for dizziness, weakness and numbness.   Hematological: Negative.    Psychiatric/Behavioral: The patient is nervous/anxious.      Constant numbness feet.  Constant/intermittent pins/needles/tingling feet.  Intermittent sharp pain legs and feet.  Constant dull/achy pain legs and feet.  Constant weakness legs.      Taken by: Altamese Carolina Last, LPN

## 2018-11-10 ENCOUNTER — Telehealth (INDEPENDENT_AMBULATORY_CARE_PROVIDER_SITE_OTHER): Payer: Medicaid Other | Admitting: Neurology

## 2018-11-10 ENCOUNTER — Telehealth (HOSPITAL_BASED_OUTPATIENT_CLINIC_OR_DEPARTMENT_OTHER): Payer: Self-pay

## 2018-11-10 ENCOUNTER — Telehealth (INDEPENDENT_AMBULATORY_CARE_PROVIDER_SITE_OTHER): Payer: Self-pay | Admitting: Internal Medicine

## 2018-11-10 ENCOUNTER — Encounter (HOSPITAL_BASED_OUTPATIENT_CLINIC_OR_DEPARTMENT_OTHER): Payer: Self-pay | Admitting: Neurology

## 2018-11-10 VITALS — Ht 64.0 in | Wt 134.0 lb

## 2018-11-10 DIAGNOSIS — R29898 Other symptoms and signs involving the musculoskeletal system: Secondary | ICD-10-CM

## 2018-11-10 MED ORDER — DULOXETINE HCL 30 MG PO CPEP
30.00 mg | ORAL_CAPSULE | Freq: Two times a day (BID) | ORAL | 1 refills | Status: DC
Start: 2018-11-10 — End: 2018-11-11

## 2018-11-10 NOTE — Telephone Encounter (Signed)
Patient is requesting to know if she should be tested for diabetes given her history of feet issues. Please advise.    Patient can be reached at [phone]    Thank you

## 2018-11-10 NOTE — Telephone Encounter (Signed)
Dr. Otilio Jefferson,   Glucose was normal at last ov.   Pt saw neuro 4 days ago according to epic.   Any further recommendations ?

## 2018-11-10 NOTE — Telephone Encounter (Signed)
Agree. She had normal glucose twice in July. Diabetes is not the cause of her symptoms.

## 2018-11-10 NOTE — Telephone Encounter (Signed)
Patient called to let Provider know that she did not realize that Cymbalta and Duloxetine are the same medication. Patient stated that she was on Duloxetine and had bad side effect. Patient is requesting a diferent medication.

## 2018-11-10 NOTE — Telephone Encounter (Signed)
Increase the dose of Gabapentin 600 mg 4 times a day    Vernetta Dizdarevic

## 2018-11-10 NOTE — Progress Notes (Signed)
Subjective:      Patient ID: Erin Huber is a 33 y.o. female.    HPI     Verbal consent has been obtained from the patient to conduct a video visit encounter to minimize exposure to COVID-19.  The patient is a pleasant 33 years old female who is being followed via telemedicine for bilateral lower extremity paresthesias, weakness and difficulty walking and balancing.  She had extensive work-up including spinal tap, nerve conduction studies as well as MRI of the lumbar and thoracic spine.  The symptoms have been persistent, she reports having this sharp stabbing pain which are intermittent, not responding to gabapentin 600 mg 3 times a day.  She was also seen by neurosurgery and it was felt the symptoms could be related to neuropathy rather than a annular tear noted in the MRI lumbar spine.  As per the mother, she continues to drink though not heavily as she used to.  She is considering to cut down with time.     Prior note:  The patient is a pleasant 33 years old right-handed Caucasian female who is here for evaluation of bilateral leg weakness and paresthesias with difficulty walking and balancing.  The patient reports that about 3 months ago she started having tingling numbness and paresthesias in the legs followed by weakness which over the period of next few weeks got worse.  She was evaluated in [place] and her symptoms were labeled to likely secondary to alcohol abuse.  She does admit to have history of alcohol abuse drinking about 5-6 small drinks on daily basis for many years.  She was found to have very low H&H, potassium and elevated LFTs which have improved now.  The bilateral leg weakness with pain, pins-and-needles, paresthesias, burning sensation have progressed to involve up to the stomach and belly as well as the fingers.  She is using the walker to walk, has been recommended physical therapy which has not been started yet.  She denies having any sickness prior to the onset of the  symptoms, she reports that about 4 months ago she was able to walk normally without having any symptoms at all.  She has been taking gabapentin 800 mg 3 times a day without any help in the pain and paresthesias.    No Known Allergies    The following portions of the patient's history were reviewed and updated as appropriate: allergies, current medications, past family history, past medical history, past social history, past surgical history and problem list.    Past Medical History:   Diagnosis Date    Anxiety      Current Outpatient Medications on File Prior to Visit   Medication Sig Dispense Refill    folic acid (FOLVITE) 1 MG tablet Take 1 mg by mouth daily      gabapentin (NEURONTIN) 300 MG capsule Take 2 capsules (600 mg total) by mouth 3 (three) times daily 180 capsule 2    loratadine (CLARITIN) 10 MG tablet Take 10 mg by mouth daily      Multiple Vitamin (MULTIVITAMIN PO) Take by mouth      Naproxen Sodium (ALEVE PO) Take by mouth      thiamine (B-1) 100 MG tablet Take 100 mg by mouth daily      verapamil (VERELAN PM) 120 MG 24 hr capsule Take 1 capsule (120 mg total) by mouth nightly 90 capsule 0     Current Facility-Administered Medications on File Prior to Visit   Medication Dose Route  Frequency Provider Last Rate Last Dose    alias med E5 injection 380 mg  380 mg Intramuscular Q28 Days Miquel Dunn, RN   380 mg at 03/02/17 1415     Review of Systems   Constitutional: Positive for activity change and fatigue.   HENT: Negative.    Eyes: Negative.    Respiratory: Negative.    Cardiovascular: Negative.    Gastrointestinal: Negative.    Endocrine: Negative.    Genitourinary: Negative.    Musculoskeletal: Positive for gait problem.   Skin: Negative.    Allergic/Immunologic: Negative.    Neurological: Positive for weakness and numbness.   Hematological: Negative.    Psychiatric/Behavioral: Positive for decreased concentration, dysphoric mood and sleep disturbance. The patient is nervous/anxious.      All other systems were reviewed and are negative except as previously note din the HPI.          Family History   Problem Relation Age of Onset    Cancer Maternal Grandmother         breast ca    Drug abuse Sister      Objective:     General: WDWN, NAD. Vital signs reviewed.  HEENT: No nasal discharges, no obvious oral masses. No icterus or conjunctival hemorrhage noted.  Neck: Full ROM. No neck masses noted.  Extremities: No edema noted in the hands.   Skin: No obvious rashes or discoloration.  Chest:  No audible wheezing or dyspnea at rest.  Abdomen: No distention, no tenderness or guarding to patient palpation.  Psychiatric: Cooperative with exam, normal affect. Thought process and speech pattern normal.    NEUROLOGICAL EXAM    Mental Status: Awake, alert, oriented. Speech fluent without dysarthria. Follows commands well. Attention, memory, and concentration intact. Fund of knowledge appropriate for education.     Cranial Nerves: Pupils equally round. Extraocular movements are full. No nystagmus seen. Facial sensation to light touch appears intact. Face is symmetric. Hearing is intact to conversational speech. Tongue protrudes midline. Cranial nerves II-XII otherwise appears intact.     Motor: Moves all extremities well, without UE drift. No obvious focal weakness noted. Bulk appears normal. No tremor noted.    Sensation: Light touch intact.     Coordination: Finger to nose intact without dysmetria.     Gait: Deferred.    Spinal tap negative for elevated proteins    MRI of the lumbar and thoracic spine:  1. The thoracic spinal cord is normal in caliber and signal intensity,  and the conus medullaris terminates at a normal level.  2. There is no significant canal stenosis in the thoracolumbar spine.  3. 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      Assessment:     34 years old female with a history of heavy alcohol abuse presenting with bilateral lower extremity weakness that seems to be  progressing with sensory level up to the mid abdomen.    Extensive work-up including the spinal tap for GBS was negative.  In the MRI lumbar spine there was annular tear with shallow central disc protrusion at L4-L5 but the neurosurgery does not think that the symptoms are related to the annular tear.  She is doing the physical therapy, feels that the symptoms are slightly better but not much.  She continues to take alcohol which is likely the cause of her bilateral leg weakness at this time.  She was strongly recommended to cut down with alcohol which she has agreed to do so  in the next few weeks.  She will continue taking gabapentin 600 mg 3 times a day and  Will add Cymbalta 30 mg once daily and she will taper up the dose up to twice daily in 1 week.  She will continue taking other medications as recommended  Follow-up in 6 weeks.    Plan:       The patient will return in follow-up as outlined above. If there are any new neurologic symptoms or worsening of current symptoms, the patient is instructed to contact us by telephone.    Lynann Bologna, MD  Neurology, Neurophysiology  [facility]

## 2018-11-11 ENCOUNTER — Other Ambulatory Visit (HOSPITAL_BASED_OUTPATIENT_CLINIC_OR_DEPARTMENT_OTHER): Payer: Self-pay

## 2018-11-11 MED ORDER — GABAPENTIN 600 MG PO TABS
600.00 mg | ORAL_TABLET | Freq: Four times a day (QID) | ORAL | 2 refills | Status: AC
Start: 2018-11-11 — End: 2019-02-09

## 2018-11-11 NOTE — Telephone Encounter (Signed)
Pt informed

## 2018-11-11 NOTE — Telephone Encounter (Signed)
Contacted Patient and informed her that the Provider wants her to take Gabapentin 600 mg four (4) times daily. Patient verbalized understanding.

## 2018-11-16 ENCOUNTER — Telehealth: Payer: Self-pay | Admitting: Internal Medicine

## 2018-11-16 ENCOUNTER — Encounter (INDEPENDENT_AMBULATORY_CARE_PROVIDER_SITE_OTHER): Payer: Self-pay

## 2018-11-16 NOTE — Telephone Encounter (Signed)
Pt req call back from nurse    Pt wants to know how much longer she needs to do PT    Also wants PCP take on neuro visit    What is plan/next steps?    Pt contact [phone]

## 2018-11-17 NOTE — Telephone Encounter (Signed)
Would you like a video visit to assess and further advise?

## 2018-11-17 NOTE — Telephone Encounter (Signed)
PT should be done until she meets the goals that PT has set for discharge.     Can schedule video visit to discuss.

## 2018-11-19 NOTE — Telephone Encounter (Signed)
Pt informed

## 2018-11-23 ENCOUNTER — Telehealth (INDEPENDENT_AMBULATORY_CARE_PROVIDER_SITE_OTHER): Payer: Medicaid Other | Admitting: Internal Medicine

## 2018-11-23 ENCOUNTER — Encounter (INDEPENDENT_AMBULATORY_CARE_PROVIDER_SITE_OTHER): Payer: Self-pay

## 2018-11-23 ENCOUNTER — Encounter (INDEPENDENT_AMBULATORY_CARE_PROVIDER_SITE_OTHER): Payer: Self-pay | Admitting: Internal Medicine

## 2018-11-23 ENCOUNTER — Ambulatory Visit (INDEPENDENT_AMBULATORY_CARE_PROVIDER_SITE_OTHER): Payer: Medicaid Other | Admitting: Neurological Surgery

## 2018-11-23 DIAGNOSIS — G629 Polyneuropathy, unspecified: Secondary | ICD-10-CM

## 2018-11-23 DIAGNOSIS — R531 Weakness: Secondary | ICD-10-CM

## 2018-11-23 DIAGNOSIS — F1020 Alcohol dependence, uncomplicated: Secondary | ICD-10-CM

## 2018-11-23 NOTE — Progress Notes (Signed)
Have you seen any specialists/other providers since your last visit with [place]?    YES    Arm preference verified?   no    The patient is due for influenza vaccine and [facility] care plan letter.

## 2018-11-23 NOTE — Progress Notes (Signed)
Subjective:      Patient ID: Erin Huber is a 33 y.o. female  Chief Complaint   Patient presents with    Discuss Disability     Would like to get on disability for neuropathy on the feet and the legs. Pt reports it is getting better but isn't able to stand or walk well for long. Pt would like a second opinion from another neurologist. Didn't care for the one she saw. Pt was told that the results were inconclusive.       VIDEO VISIT  Verbal consent has been obtained from the patient to conduct a video and telephone visit to minimize exposure to COVID-19: yes      HPI    Video unable to connect. Completed by telephone.     Here for follow up neuropathy. Had follow up and hasn't had a great sense of what is going on. Would like a second opinion. Overall her pain has somewhat improved and weakness. But still with persistent pain.     PT told Erin Huber it's up to PCP or neuro when she should stop PT. Has PT re-eval last week and found that she is improving. Continues with PT.     Drinking a little bit every now and then. 2-3 minibottles every few days. Not sure if she wants     The following portions of the patient's history were reviewed and updated as appropriate: current medications, allergies, past family history, past medical history, past social history, past surgical history and problem list.     Review of Systems   Constitutional: Negative.    Respiratory: Negative.    Cardiovascular: Negative.    Musculoskeletal: Positive for gait problem.   Neurological: Positive for numbness.         There were no vitals taken for this visit. TELEVISIT  Objective:   Physical Exam  Telephone visit    Assessment:     1. Weakness  Ambulatory referral to Neurology   2. Alcoholism  Ambulatory referral to Neurology   3. Neuropathy  Ambulatory referral to Neurology         Plan:     33 y.o. female with alcohol dependency presents for weakness that has progressed over several months. Recently stabilized and improving with PT. Work  up has not revealed clear cause. Suspect alcohol related   -considering second opinion by neuro  -will need to determine prognosis to discuss long term disability with specialist  -continue PT  -once again strongly encouraged alcohol treatment program such as [facility]. Counseled on risk of worsening with continued alcohol use and less likely to fully recover her function and pain free. Cautioned on risk with continued alcohol use and improved success with being part of treatment program  -continue gabapentin, cymbalta per neuro  -will likely need treatment for hyperlipidemia  -monitor home bp  -counseled on signs/symptoms to warrant re-evaluation     Return in about 4 weeks (around 12/21/2018) for follow up current symptoms.     Gust Brooms, MD      Time spent in discussion: 20 min

## 2018-11-23 NOTE — Patient Instructions (Signed)
Alcohol Addiction    Does your drinking harm yourself or others? Or has it led to other problems with your daily life? If so, you may be addicted to alcohol.  You may have what's called an alcohol use disorder. Your healthcare provider may make this diagnosis if you have had at least 2 of these problems in a year:   You drink alcohol in larger amounts or for a longer period than you planned.   You often want to cut down or control how much you drink. Or you have often failed to do so.   You spend a lot of time getting alcohol, using it, or recovering from its use.   You crave or have a strong desire or urge to drink.   Your drinking makes it hard for you to be responsible at work, school, or home.   You keep on drinking even though you have had problems in relationships or social settings because of it.   You give up or miss important social, work, or other activities because of your drinking.   You drink alcohol at times when it's not physically safe, such as drinking then driving.   You keep on drinking even though you know it has caused physical or emotional problems.   You need more and more alcohol to get the same effects.   You hide how much you drink from family and friends.   You have withdrawal symptoms or use alcohol to avoid such symptoms.  [employer] last reviewed this educational content on 04/25/2015   2000-2020 [employer] [address], [place] [address]. All rights reserved. This information is not intended as a substitute for professional medical care. Always follow your healthcare professional's instructions.

## 2018-12-23 ENCOUNTER — Encounter (HOSPITAL_BASED_OUTPATIENT_CLINIC_OR_DEPARTMENT_OTHER): Payer: Self-pay | Admitting: Neurology

## 2018-12-23 ENCOUNTER — Telehealth (INDEPENDENT_AMBULATORY_CARE_PROVIDER_SITE_OTHER): Payer: Medicaid Other | Admitting: Neurology

## 2018-12-23 VITALS — Ht 64.0 in | Wt 134.0 lb

## 2018-12-23 DIAGNOSIS — R29898 Other symptoms and signs involving the musculoskeletal system: Secondary | ICD-10-CM

## 2018-12-23 NOTE — Progress Notes (Signed)
Subjective:      Patient ID: Erin Huber is a 33 y.o. female.    HPI     The patient is a pleasant 33 years old right-handed Caucasian female who is here for evaluation of bilateral leg weakness and paresthesias with difficulty walking and balancing.  The patient reports that about 3 months ago she started having tingling numbness and paresthesias in the legs followed by weakness which over the period of next few weeks got worse.  She was evaluated in [place] and her symptoms were labeled to likely secondary to alcohol abuse.  She does admit to have history of alcohol abuse drinking about 5-6 small drinks on daily basis for many years.  She was found to have very low H&H, potassium and elevated LFTs which have improved now.  The bilateral leg weakness with pain, pins-and-needles, paresthesias, burning sensation have progressed to involve up to the stomach and belly as well as the fingers.  She is using the walker to walk, has been recommended physical therapy which has not been started yet.  She denies having any sickness prior to the onset of the symptoms, she reports that about 4 months ago she was able to walk normally without having any symptoms at all.  She has been taking gabapentin 800 mg 3 times a day without any help in the pain and paresthesias.    No Known Allergies    The following portions of the patient's history were reviewed and updated as appropriate: allergies, current medications, past family history, past medical history, past social history, past surgical history and problem list.    Past Medical History:   Diagnosis Date    Anxiety      Current Outpatient Medications on File Prior to Visit   Medication Sig Dispense Refill    folic acid (FOLVITE) 1 MG tablet Take 1 mg by mouth daily      gabapentin (NEURONTIN) 600 MG tablet Take 1 tablet (600 mg total) by mouth 4 (four) times daily 120 tablet 2    loratadine (CLARITIN) 10 MG tablet Take 10 mg by mouth daily      Multiple Vitamin  (MULTIVITAMIN PO) Take by mouth      Naproxen Sodium (ALEVE PO) Take by mouth      thiamine (B-1) 100 MG tablet Take 100 mg by mouth daily      verapamil (VERELAN PM) 120 MG 24 hr capsule Take 1 capsule (120 mg total) by mouth nightly 90 capsule 0     Current Facility-Administered Medications on File Prior to Visit   Medication Dose Route Frequency Provider Last Rate Last Dose    alias med E5 injection 380 mg  380 mg Intramuscular Q28 Days Miquel Dunn, RN   380 mg at 03/02/17 1415     Review of Systems   Constitutional: Positive for activity change and fatigue.   HENT: Negative.    Eyes: Negative.    Respiratory: Negative.    Cardiovascular: Negative.    Gastrointestinal: Negative.    Endocrine: Negative.    Genitourinary: Negative.    Musculoskeletal: Positive for gait problem.   Skin: Negative.    Allergic/Immunologic: Negative.    Neurological: Positive for weakness and numbness.   Hematological: Negative.    Psychiatric/Behavioral: Positive for decreased concentration, dysphoric mood and sleep disturbance. The patient is nervous/anxious.     All other systems were reviewed and are negative except as previously note din the HPI.  Family History   Problem Relation Age of Onset    Cancer Maternal Grandmother         breast ca    Drug abuse Sister      Objective:     Vitals:    12/23/18 1359   Weight: 60.8 kg (134 lb)   Height: 1.626 m (5\' 4" )     Neurologic Exam    Constitutional: Vital signs reviewed.   Head: Normocephalic, atraumatic, neck supple no JVD  Eyes: No conjunctival injection. No discharge.   ENT: Mucous membranes moist   Neck: Normal range of motion. Non tender.   Respiratory/Chest: Clear to auscultation. No respiratory distress.   Cardiovascular: Regular rate and rhythm. No murmur.   Lower Extremity: No edema. No cyanosis.   Skin: Warm and dry. No rash.   Lymphatic: No cervical lymphadenopathy.   Psychiatric: Normal affect.     Mental Status: The patient was awake, alert, and  oriented. Conversation   was appropriate. Speech was fluent, and the patient followed commands   consistently.  Cranial Nerves: Pupils were equally round and reactive to light. Extraocular  movements were intact, without nystagmus. The patients face was symmetric,   and facial sensation was intact. Tongue and palate were midline.   Motor Exam: The patient had weakness in both lower extremities. Tone and bulk appeared   normal. There were no abnormal movements or tremor noted.   Sensation: Reduced light touch, vibration, pinprick up to the mid abdomen.    Coordination: Finger-to-nose was intact   without dysmetria. Romberg testing was positive.  Gait: Difficulty standing up on her own, requiring walker for stability, antalgic gait.  Deep Tendon Reflexes: Reduced to absent in the ankles and knees bilaterally  Toes were downgoing.    Assessment:     33 years old female with a history of heavy alcohol abuse presenting with bilateral lower extremity weakness that seems to be progressing with sensory level up to the mid abdomen.  The patient reports that up to 4 months ago she had no trouble and was able to walk or climb stairs without any trouble.  Apparently the symptoms have been labeled to alcohol which she has stopped but need to rule out autoimmune etiology like acute demyelinating neuropathy.  The option of further work-up including spinal tap, MRI of the spine as well as the nerve conduction studies was suggested.  The patient is okay to get the spinal tap in the emergency room today.  If the spinal tap is negative then will get the MRI lumbar and thoracic spine as outpatient.  We will increase the dose of gabapentin to 300 mg 3 times a day.  She will need physical therapy possibly acute rehabilitation if possible  She was strongly recommended to cut down and totally stop the alcohol back may in part be because of the neuropathy but with a concern of symptoms starting 3 months ago with progressive weakness, may  need to rule out other etiologies.        Plan:       The patient will return in follow-up as outlined above. If there are any new neurologic symptoms or worsening of current symptoms, the patient is instructed to contact us by telephone.    Lynann Bologna, MD  Neurology, Neurophysiology  [facility]

## 2018-12-28 NOTE — Progress Notes (Signed)
Subjective:      Patient ID: Erin Huber is a 33 y.o. female.    HPI     Verbal consent has been obtained from the patient to conduct a video visit encounter to minimize exposure to COVID-19.  The patient is a pleasant 33 years old female who is being followed via telemedicine for bilateral lower extremity paresthesias, weakness and difficulty walking and balancing.   The symptoms have improved significantly, she is able to walk with the help of her walker or the cane and at home she is not using anything though it is not completely back to normal.  She reports that she is still drinking, confirmed by her mother.  She is thinking of going back to her home in [place] and the mother is concerned about her being alone out there.    Prior note:  She had extensive work-up including spinal tap, nerve conduction studies as well as MRI of the lumbar and thoracic spine.  The symptoms have been persistent, she reports having this sharp stabbing pain which are intermittent, not responding to gabapentin 600 mg 3 times a day.  She was also seen by neurosurgery and it was felt the symptoms could be related to neuropathy rather than a annular tear noted in the MRI lumbar spine.  As per the mother, she continues to drink though not heavily as she used to.  She is considering to cut down with time.     Prior note:  The patient is a pleasant 33 years old right-handed Caucasian female who is here for evaluation of bilateral leg weakness and paresthesias with difficulty walking and balancing.  The patient reports that about 3 months ago she started having tingling numbness and paresthesias in the legs followed by weakness which over the period of next few weeks got worse.  She was evaluated in [place] and her symptoms were labeled to likely secondary to alcohol abuse.  She does admit to have history of alcohol abuse drinking about 5-6 small drinks on daily basis for many years.  She was found to have very low H&H,  potassium and elevated LFTs which have improved now.  The bilateral leg weakness with pain, pins-and-needles, paresthesias, burning sensation have progressed to involve up to the stomach and belly as well as the fingers.  She is using the walker to walk, has been recommended physical therapy which has not been started yet.  She denies having any sickness prior to the onset of the symptoms, she reports that about 4 months ago she was able to walk normally without having any symptoms at all.  She has been taking gabapentin 800 mg 3 times a day without any help in the pain and paresthesias.    No Known Allergies    The following portions of the patient's history were reviewed and updated as appropriate: allergies, current medications, past family history, past medical history, past social history, past surgical history and problem list.    Past Medical History:   Diagnosis Date    Anxiety      Current Outpatient Medications on File Prior to Visit   Medication Sig Dispense Refill    folic acid (FOLVITE) 1 MG tablet Take 1 mg by mouth daily      gabapentin (NEURONTIN) 600 MG tablet Take 1 tablet (600 mg total) by mouth 4 (four) times daily 120 tablet 2    loratadine (CLARITIN) 10 MG tablet Take 10 mg by mouth daily  Multiple Vitamin (MULTIVITAMIN PO) Take by mouth      Naproxen Sodium (ALEVE PO) Take by mouth      thiamine (B-1) 100 MG tablet Take 100 mg by mouth daily      verapamil (VERELAN PM) 120 MG 24 hr capsule Take 1 capsule (120 mg total) by mouth nightly 90 capsule 0     Current Facility-Administered Medications on File Prior to Visit   Medication Dose Route Frequency Provider Last Rate Last Dose    alias med E5 injection 380 mg  380 mg Intramuscular Q28 Days Miquel Dunn, RN   380 mg at 03/02/17 1415     Review of Systems   Constitutional: Positive for activity change and fatigue.   HENT: Negative.    Eyes: Negative.    Respiratory: Negative.    Cardiovascular: Negative.    Gastrointestinal:  Negative.    Endocrine: Negative.    Genitourinary: Negative.    Musculoskeletal: Positive for gait problem.   Skin: Negative.    Allergic/Immunologic: Negative.    Neurological: Positive for weakness and numbness.   Hematological: Negative.    Psychiatric/Behavioral: Positive for decreased concentration, dysphoric mood and sleep disturbance. The patient is nervous/anxious.     All other systems were reviewed and are negative except as previously note din the HPI.          Family History   Problem Relation Age of Onset    Cancer Maternal Grandmother         breast ca    Drug abuse Sister      Objective:     General: WDWN, NAD. Vital signs reviewed.  HEENT: No nasal discharges, no obvious oral masses. No icterus or conjunctival hemorrhage noted.  Neck: Full ROM. No neck masses noted.  Extremities: No edema noted in the hands.   Skin: No obvious rashes or discoloration.  Chest:  No audible wheezing or dyspnea at rest.  Abdomen: No distention, no tenderness or guarding to patient palpation.  Psychiatric: Cooperative with exam, normal affect. Thought process and speech pattern normal.    NEUROLOGICAL EXAM    Mental Status: Awake, alert, oriented. Speech fluent without dysarthria. Follows commands well. Attention, memory, and concentration intact. Fund of knowledge appropriate for education.     Cranial Nerves: Pupils equally round. Extraocular movements are full. No nystagmus seen. Facial sensation to light touch appears intact. Face is symmetric. Hearing is intact to conversational speech. Tongue protrudes midline. Cranial nerves II-XII otherwise appears intact.     Motor: Moves all extremities well, without UE drift. No obvious focal weakness noted. Bulk appears normal. No tremor noted.    Sensation: Light touch intact.     Coordination: Finger to nose intact without dysmetria.     Gait: Deferred.    Spinal tap negative for elevated proteins    MRI of the lumbar and thoracic spine:  1. The thoracic spinal cord is  normal in caliber and signal intensity,  and the conus medullaris terminates at a normal level.  2. There is no significant canal stenosis in the thoracolumbar spine.  3. 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      Assessment:     33 years old female with a history of heavy alcohol abuse presenting with bilateral lower extremity weakness that seems to be progressing with sensory level up to the mid abdomen.    Extensive work-up including the spinal tap for GBS was negative.  In the MRI lumbar spine there was  annular tear with shallow central disc protrusion at L4-L5 but the neurosurgery does not think that the symptoms are related to the annular tear.  She is doing the physical therapy, feels that the symptoms are better.  She continues to take alcohol which is likely the cause of her bilateral leg weakness at this time.  She was strongly recommended to cut down with alcohol which she has agreed to do so in the next few weeks.  She will continue taking gabapentin 600 mg 3 times a day and  She will continue taking other medications as recommended  Follow-up in 6 weeks.    Plan:       The patient will return in follow-up as outlined above. If there are any new neurologic symptoms or worsening of current symptoms, the patient is instructed to contact us by telephone.    Lynann Bologna, MD  Neurology, Neurophysiology  [facility]

## 2019-03-30 ENCOUNTER — Other Ambulatory Visit (INDEPENDENT_AMBULATORY_CARE_PROVIDER_SITE_OTHER): Payer: Self-pay | Admitting: Internal Medicine

## 2019-03-30 DIAGNOSIS — R42 Dizziness and giddiness: Secondary | ICD-10-CM

## 2019-03-31 NOTE — Telephone Encounter (Signed)
Refill sent.

## 2019-04-20 ENCOUNTER — Telehealth (INDEPENDENT_AMBULATORY_CARE_PROVIDER_SITE_OTHER): Payer: Self-pay | Admitting: Internal Medicine

## 2019-04-20 NOTE — Telephone Encounter (Signed)
Please call pharmacy directly for clarification. Also, she is overdue for follow up.

## 2019-04-20 NOTE — Telephone Encounter (Signed)
Please advise 

## 2019-04-20 NOTE — Telephone Encounter (Signed)
Spoke with pharmacy - Verapamil not covered needs PA   -- ins # to call [phone] (doesn't go through cover my meds)    -- Erin Huber since I am not here the next 2 days, would you please call and see what we need.  Thank you so much

## 2019-04-20 NOTE — Telephone Encounter (Signed)
Pt's mother calling stating that verapamil (VERELAN PM) 120 MG 24 hr capsule was denied by the insurance.  Mother stated that on the letter that that she receieved from the ins it said that an alternative would be VERAPAMIL TAB IRNER. Pt's mother would like a call back [phone].      [facility]  [address]  [place], [place]   [phone] (Phone)  [phone] (Fax)

## 2019-04-22 NOTE — Telephone Encounter (Signed)
Called pharmacy and they are unable to provide a KEY.   Need to call [phone]

## 2019-04-29 MED ORDER — DILTIAZEM HCL ER COATED BEADS 120 MG PO CP24
120.00 mg | ORAL_CAPSULE | Freq: Every day | ORAL | 0 refills | Status: DC
Start: 2019-04-29 — End: 2019-06-13

## 2019-04-29 NOTE — Telephone Encounter (Signed)
Refill sent for cartia. She is overdue for follow up.

## 2019-04-29 NOTE — Telephone Encounter (Signed)
Called to attempt Pa.   This was already submitted on 01/22 and denied on 1/29.   They are stating alternative that will be covered it cartia xt.

## 2019-04-29 NOTE — Telephone Encounter (Signed)
Left detailed message for pt 

## 2019-05-16 ENCOUNTER — Other Ambulatory Visit (HOSPITAL_BASED_OUTPATIENT_CLINIC_OR_DEPARTMENT_OTHER): Payer: Self-pay

## 2019-05-16 DIAGNOSIS — R29898 Other symptoms and signs involving the musculoskeletal system: Secondary | ICD-10-CM

## 2019-05-16 MED ORDER — GABAPENTIN 300 MG PO CAPS
300.00 mg | ORAL_CAPSULE | Freq: Three times a day (TID) | ORAL | 0 refills | Status: DC
Start: 2019-05-16 — End: 2019-06-13

## 2019-06-01 DIAGNOSIS — K7031 Alcoholic cirrhosis of liver with ascites: Secondary | ICD-10-CM | POA: Insufficient documentation

## 2019-06-01 DIAGNOSIS — K852 Alcohol induced acute pancreatitis without necrosis or infection: Secondary | ICD-10-CM

## 2019-06-01 DIAGNOSIS — G629 Polyneuropathy, unspecified: Secondary | ICD-10-CM | POA: Insufficient documentation

## 2019-06-01 DIAGNOSIS — K7460 Unspecified cirrhosis of liver: Secondary | ICD-10-CM | POA: Insufficient documentation

## 2019-06-01 HISTORY — DX: Alcohol induced acute pancreatitis without necrosis or infection: K85.20

## 2019-06-13 ENCOUNTER — Encounter (INDEPENDENT_AMBULATORY_CARE_PROVIDER_SITE_OTHER): Payer: Self-pay | Admitting: Internal Medicine

## 2019-06-13 ENCOUNTER — Ambulatory Visit (INDEPENDENT_AMBULATORY_CARE_PROVIDER_SITE_OTHER): Payer: Medicaid Other | Admitting: Internal Medicine

## 2019-06-13 VITALS — BP 109/69 | HR 99 | Temp 98.4°F | Wt 116.0 lb

## 2019-06-13 DIAGNOSIS — D473 Essential (hemorrhagic) thrombocythemia: Secondary | ICD-10-CM

## 2019-06-13 DIAGNOSIS — R531 Weakness: Secondary | ICD-10-CM

## 2019-06-13 DIAGNOSIS — F1020 Alcohol dependence, uncomplicated: Secondary | ICD-10-CM

## 2019-06-13 DIAGNOSIS — D75839 Thrombocytosis, unspecified: Secondary | ICD-10-CM

## 2019-06-13 DIAGNOSIS — K7031 Alcoholic cirrhosis of liver with ascites: Secondary | ICD-10-CM

## 2019-06-13 DIAGNOSIS — F419 Anxiety disorder, unspecified: Secondary | ICD-10-CM

## 2019-06-13 DIAGNOSIS — E785 Hyperlipidemia, unspecified: Secondary | ICD-10-CM

## 2019-06-13 DIAGNOSIS — G629 Polyneuropathy, unspecified: Secondary | ICD-10-CM

## 2019-06-13 DIAGNOSIS — F331 Major depressive disorder, recurrent, moderate: Secondary | ICD-10-CM

## 2019-06-13 HISTORY — DX: Major depressive disorder, recurrent, moderate: F33.1

## 2019-06-13 LAB — COMPREHENSIVE METABOLIC PANEL
ALT: 59 U/L — ABNORMAL HIGH (ref 0–55)
AST (SGOT): 71 U/L — ABNORMAL HIGH (ref 5–34)
Albumin/Globulin Ratio: 0.8 — ABNORMAL LOW (ref 0.9–2.2)
Albumin: 3.3 g/dL — ABNORMAL LOW (ref 3.5–5.0)
Alkaline Phosphatase: 153 U/L — ABNORMAL HIGH (ref 37–106)
Anion Gap: 13 (ref 5.0–15.0)
BUN: 17 mg/dL (ref 7.0–19.0)
Bilirubin, Total: 2 mg/dL — ABNORMAL HIGH (ref 0.2–1.2)
CO2: 27 mEq/L (ref 21–29)
Calcium: 9.3 mg/dL (ref 8.5–10.5)
Chloride: 97 mEq/L — ABNORMAL LOW (ref 100–111)
Creatinine: 0.8 mg/dL (ref 0.4–1.5)
Globulin: 4.3 g/dL — ABNORMAL HIGH (ref 2.0–3.7)
Glucose: 71 mg/dL (ref 70–100)
Potassium: 4.1 mEq/L (ref 3.5–5.1)
Protein, Total: 7.6 g/dL (ref 6.0–8.3)
Sodium: 137 mEq/L (ref 136–145)

## 2019-06-13 LAB — LIPID PANEL
Cholesterol / HDL Ratio: 7.8
Cholesterol: 376 mg/dL — ABNORMAL HIGH (ref 0–199)
HDL: 48 mg/dL (ref 40–9999)
LDL Calculated: 301 mg/dL — ABNORMAL HIGH (ref 0–99)
Triglycerides: 137 mg/dL (ref 34–149)
VLDL Calculated: 27 mg/dL (ref 10–40)

## 2019-06-13 LAB — CBC
Absolute NRBC: 0 10*3/uL (ref 0.00–0.00)
Hematocrit: 32.6 % — ABNORMAL LOW (ref 34.7–43.7)
Hgb: 10.3 g/dL — ABNORMAL LOW (ref 11.4–14.8)
MCH: 34.1 pg — ABNORMAL HIGH (ref 25.1–33.5)
MCHC: 31.6 g/dL (ref 31.5–35.8)
MCV: 107.9 fL — ABNORMAL HIGH (ref 78.0–96.0)
MPV: 12.6 fL — ABNORMAL HIGH (ref 8.9–12.5)
Nucleated RBC: 0 /100 WBC (ref 0.0–0.0)
Platelets: 271 10*3/uL (ref 142–346)
RBC: 3.02 10*6/uL — ABNORMAL LOW (ref 3.90–5.10)
RDW: 16 % — ABNORMAL HIGH (ref 11–15)
WBC: 22.15 10*3/uL — ABNORMAL HIGH (ref 3.10–9.50)

## 2019-06-13 LAB — HEMOLYSIS INDEX: Hemolysis Index: 1 (ref 0–18)

## 2019-06-13 LAB — GFR: EGFR: >60

## 2019-06-13 LAB — LIPASE: Lipase: 137 U/L — ABNORMAL HIGH (ref 8–78)

## 2019-06-13 MED ORDER — CITALOPRAM HYDROBROMIDE 10 MG PO TABS
10.0000 mg | ORAL_TABLET | Freq: Every morning | ORAL | 1 refills | Status: DC
Start: 2019-06-13 — End: 2019-07-06

## 2019-06-13 MED ORDER — LACTULOSE 10 GM/15ML PO SOLN
20.00 g | Freq: Three times a day (TID) | ORAL | 0 refills | Status: AC
Start: 2019-06-13 — End: 2019-07-13

## 2019-06-13 NOTE — Progress Notes (Addendum)
06/13/2019  Spoke with patient after her appointment to inform her that we scheduled a visit with [facility] in [place], on 06/15/2019 8 AM, with Dr. Roxy Cedar.     06/16/2019  Spoke with patient and her mom to check in. There is a lot going on (many appointments, trying to schedule an MRI, getting lots of labs back) but they are managing to coordinate. The biggest issue right now is Erin Huber's leg weakness and brain, which she saw neurology for earlier today. They are working on getting [name] a head MRI, and waiting for lab results.

## 2019-06-13 NOTE — Progress Notes (Signed)
Have you seen any specialists/other providers since your last visit with [place]?    Yes  Hospitalization    Arm preference verified?   Yes    The patient is due for pneumonia vaccine and [facility]

## 2019-06-13 NOTE — Patient Instructions (Signed)
Discharge Instructions for Cirrhosis of the Liver  You have been diagnosed with cirrhosis of the liver. This is a long-term (chronic)problem. It occurs when liver tissue is destroyed and replaced by scar tissue. Causes of cirrhosis include:   Infection such as viral hepatitis   Chronic alcoholism   The bodys immune system attacks healthy cells (autoimmune disorders)   Obesity, diabetes, high blood pressure, and high cholesterol   Medicine side effects   Genetic diseases  Sometimes the exact cause is unknown. You may not have any symptoms at first. Or your symptoms may be mild. But they usually get worse. Cirrhosis is likely to occur if you have a history of long-term alcohol abuse. Cirrhosis cant be cured. But it can be treatedand sometimes can improve.  Home care  Alcohol   People with liver disease should not drink alcohol. If you stop drinking, you may feel better and live longer.   If you are a chronic alcohol user, you will have withdrawal symptoms. Talk with your healthcare provider for more information.    If alcohol is a problem, ask your provider about medicine that can help you quit drinking.    Find a local Alcoholics Anonymous support group online.  Diet   Ask your provider what kind of diet you should follow. You may be asked to limit or not eat certain foods. Do not limit your protein intake.   Weigh yourself daily and keep a weight log. If you have a sudden change in weight, call your provider.   Cut back on salt:  ? Limit canned, dried, packaged, and fast foods.  ? Dont add salt to your food at the table.  ? Season foods with herbs instead of salt when you cook.  Medicines, supplements, and vaccines   Take your medicines exactly as directed.   Talk with your provider before taking vitamins, over-the-counter medicines, or herbal supplements. Some herbal supplements may be toxic to the liver. Pain killers called NSAIDs (nonsteroidal anti-inflammatory drugs) such as ibuprofen  can harm the liver if you have cirrhosis.   Don't take aspirin or other blood-thinning medicines.   Discuss vitamin supplements and deficiencies with your provider.   Ask your provider about getting vaccines for viruses that can cause liver diseases.    Follow-up care  Follow up with your healthcare provider, or as advised. You will likely have the following tests:   Lab tests   Blood tests for liver cancer   Ultrasound or MRI of your liver every 6 months   Endoscopy to check for swollen veins (varices) in your digestive tract  When to call your provider  Call your healthcare provider right away if you have any of the following:   Fever of100.61F(38.0C) or higher, or as directed by your provider   Extreme tiredness (fatigue),weakness,or lack of appetite   Vomiting (with or without blood)   Yellowing of your skin or eyes (jaundice)   Itching   Swelling in your belly or legs   Black or tarry stools   Skin that bruises easily   Confusion or trouble thinking clearly  StayWell last reviewed this educational content on 08/22/2017   2000-2020 [employer], [place]. [address], [place], [place] [address]. All rights reserved. This information is not intended as a substitute for professional medical care. Always follow your healthcare professional's instructions.        Treating Cirrhosis  Cirrhosis is a condition where the liver is damaged. Scar tissue slowly replaces healthy tissue.  This makes the liver stop working properly. Treatment can control or slow liver scarring. Follow your healthcare providers instructions closely to get the most out of your treatment. And ask your family and friends for support.   Making a treatment plan  You and your healthcare provider will decide on a treatment plan thats best for you. The plan may include one or more of the following:    Not drinkingalcohol. Heavy alcohol use can damage the liver. Once the liver is damaged, even a small amount of alcohol can  cause problems. You can slow down thecirrhosis getting worseif you stop all alcohol use.   Taking medicines. You may need to take these to treat some causes of cirrhosis. These include infection or a bile duct blockage. For instance, hepatitis C can now be cured with medicine. You may also need medicine to help your blood clot. And you may need medicineif your immune system is attacking the liver or bile ducts. You should not take certain other medicines if you have cirrhosis. These include NSAIDs such as ibuprofen and naproxen.   Treating symptoms. Cirrhosis can cause swelling in your stomach and legs. A low-salt diet can help ease this symptom. Stay under about 2,000 mg of sodium a day. Taking water pills (diuretics) may also be prescribed to help with swelling.   Eating healthy foods   Losing extra weight. This is especially important ifyou are overweight, or have diabetes, high blood pressure, or high cholesterol and triglycerides. Controlling these condition can slow down thecirrhosis getting worse. Exercise can help you lose weight.   Removing iron from your blood. Sometimes the amount of iron in your liver is higher than normal. Your healthcare provider may remove extra iron from your blood.  Severe cases of cirrhosis may need special treatments. Your healthcare provider can discuss them with you.   Vaccines  Be sure to ask your healthcare provider about recommended vaccines. These include vaccines for viruses than can cause liver disease.   Don't drink alcohol  Alcohol use can destroy liver cells. If you have problems quitting alcohol, ask your healthcare provider to get the support you need. Your healthcare provider may be able to suggest local groups that can help you stop drinking.   StayWell last reviewed this educational content on 08/22/2017   2000-2020 [employer], [place]. [address] [place], [place] [address]. All rights reserved. This information is not intended as a substitute  for professional medical care. Always follow your healthcare professional's instructions.

## 2019-06-13 NOTE — Progress Notes (Signed)
Subjective:      Patient ID: Erin Huber is a 34 y.o. female.    Chief Complaint:  Chief Complaint   Patient presents with    Hospital Follow-up     d/c cirrhosis of the liver, discuss labs, referral to specialists     Anxiety     discuss medication     Presents with mom who contributes to history.     HPI   Here for follow-up from recent hospitalization.   Inpatient facility: [facility]  Date of admission: March 9  Date of discharge: March 13  Discharge diagnosis: cirrhosis  Hospital course:   Was having abdominal pain and confusion. Was living with boyfriend in [place]. He took her to hospital and found to have cirrhosis. Ammonia level was high and now on lactulose. Not sure what happened. Doesn't remember a lot. Stopped drinking prior to hospitalization. Now home with mom. Thinks she will be here for a little while. Needs to see GI>     Not sure why she's off the cymbalta.     Discharge Summary    Date of Admission: 05/31/2019 Name: Erin Huber   Date of Discharge: 06/04/2019 DOB: April 05, 1985  Discharging Team: Hospitalist Medicine MRN: [id number]  Consultants: Gastroenterology  Primary Care Physician: Pcp None    Discharge Diagnoses:   Cirrhosis (CMS/HCC)  Alcohol-induced acute pancreatitis  Alcohol abuse  Neuropathy  Ascites due to alcoholic cirrhosis (CMS/HCC)        Discharge Medications     .   furosemide 40 MG tablet  Take 1 tablet (40 mg total) by mouth daily.  Commonly known as: LASIX  Start taking on: June 05, 2019    gabapentin 300 MG capsule  Take 600 mg by mouth 4 (four) times a day.   Commonly known as: NEURONTIN    lactulose 20 gram/30 mL Soln  Take 30 mL (20 g total) by mouth 3 (three) times a day for 30 days.  Commonly known as: ENULOSE    prednisoLONE 15 mg/5 mL (3 mg/mL) solution  Take 13.3 mL (40 mg total) by mouth daily for 7 days.  Commonly known as: ORAPRED  Start taking on: June 05, 2019    spironolactone 50 MG tablet  Take 1 tablet (50 mg total) by mouth daily.  Commonly  known as: ALDACTONE  Start taking on: June 05, 2019        Other Discharge Instructions:   Disposition: Home  Follow Up: pcp and Gi next week, labs on 3/18 advised  Activity: As tolerated  Diet: 2gm NA    Results:   Last Weight: 63.8 kg (140 lb 10.5 oz)  Lab Results   Component Value Date   WBC 18.0 (H) 06/02/2019   HGB 8.0 (L) 06/02/2019   HCT 24 (L) 06/02/2019   PLT 255 06/02/2019   INR 1.59 05/31/2019   NA 137 06/03/2019   K 4.5 06/03/2019   CL 105 06/03/2019   CO2 27 06/03/2019   BUN 10 06/03/2019   CREAT 0.52 06/03/2019   GLU 74 06/03/2019   CA 8.3 (L) 06/03/2019   MG 1.7 06/02/2019   PHOS 3.7 06/02/2019   ALB 2.0 (L) 06/02/2019   TP 5.4 (L) 06/02/2019   ALT 26 06/02/2019   AST 81 (H) 06/02/2019   BILIT 2.3 (H) 06/02/2019   LIPAS 55 06/02/2019   TROP <0.03 05/31/2019   TRIG 107 06/02/2019     Unresulted Labs   None  Radiology: Ecg 12 Lead;    Result Date: 05/31/2019  Sinus tachycardia ST & T wave abnormality, consider anterior ischemia Abnormal ECG No previous ECGs available Confirmed by MYLES, HERBERT L. ([id number]) on 05/31/2019 9:39:51 PM    US Gallbladder Or Ruq    Result Date: 06/02/2019  IMPRESSION: 1. Gallbladder is contracted and demonstrates a thickened wall, without definite cholelithiasis. Evaluation is technically limited related to the contracted state and wall thickening. This could relate to generalized ascites and edema or cholecystitis. Nuclear medicine hepatobiliary scan may be useful in further evaluation as clinically indicated. 2. Abundant ascites. 3. Mildly nodular contour of the liver suggesting cirrhosis.    Xr Chest 2 Views    Result Date: 06/01/2019  IMPRESSION: Small right pleural effusion with a basilar opacity related to atelectasis or infiltrate..     Ct Abdomen And Pelvis With Iv Contrast    Result Date: 05/31/2019  IMPRESSION: 1. Morphologic sequelae of hepatic cirrhosis and portal venous hypertension, including mild splenomegaly, as noted above. The SMV, splenic vein, and portal veins  appear grossly patent. 2. Moderate to large volume of ascites throughout the abdomen and pelvis, as noted above. 3. Small right pleural effusion. 4. Question mild peripancreatic fat stranding/edema, raising the possibility of underlying acute pancreatitis. Correlation with pancreatic enzyme testing is recommended. 5. Multiple mildly enlarged porta hepatis and portacaval lymph nodes, which are nonspecific, most likely reactive in etiology, related to the patient's cirrhosis. Follow-up imaging recommended.     History of Present Illness: Please see admission note for full details regarding the patient's presentation. In brief, this 34 y.o. female admitted w/cirrhosis    Hospital Course:   34 y/o female with hx of heavy alcohol abuse and neuropathy presents with increased abdominalswelling, with associated tightness/discomfort overlast fewweeks. Over the last few days she has also noticed more severe abdominal pain in the epigastric area. Patient reports associated nausea but denies any vomiting.   Boyfriend reported agitation and confusion with discontinuation of alcohol. Boyfriendreports agitation has resolved but persistentforgetfulness, mild confusion.  CT scan a/p showed r pleural effusion, moderate to severe ascites and cirrhotic liver with mild splenomegaly. Patent SM, portal and splenic veins.Questionable pancreatic edema.    -Newly diagnosed alcoholic cirrhosis / alcoholic hepatitis  Appreciate GI assistance, started on prednisolone; they advise repeat labs on March 18 to determine if therapy is beneficial for continuation of 28-day course this is complicated by the fact that she is from [place] area and does not even know the name of her primary care doctor. We will need to coordinate for safe discharge planning  S/p Paracentesis with 2.4 liters removal, sbp r/o  Started on diuretics   Again counseled on alcohol cessation / low salt diet / dietitian consult   Per GI:  1. Continue  Prednisolone 40mg  QD for alcoholic hepatitis  2. Diuresis has been started with Lasix 40 and Aldactone 50. BMP today is pending. Could probably titrate dose up as tolerated if lytes/renal function/BP allow.  3. Low sodium diet, <2g daily  4. Lactulose can be titrated to a goal of 3 soft BMs daily  5. Complete alcohol abstinence was again encouraged, and is essential to her recovery  6. She is inquiring about discharge. I don't see a GI reason she would need to stay in house. We would like her to get labs drawn on 3/18 after completing 7 days of prednisolone therapy to see if she is a responder. If so, we would recommend a full 28 day  course. I have sent a message to our office to help coordinate this lab draw.  -She will need to establish care long-term with an outpatient GI provider for continued mgmt of cirrhosis and to discuss EGD for variceal screening. She is from [place] and will find a local GI closer to her home.    -Anasarca / hypoalbuminemia secondary to above    -Mild hepatic encephalopathy  On lactulose, emphasized to her the importance of compliance and loose stools 3 times a day    -Leukocytosis  Likely reactive without focal signs of infection    -Alcoholism  SA counseling  high dose thiamine x 3 days    -Neuropathy  Continue gabapentin  Normal B12 levels    -Macrocytic anemia  Due to alcoholism / mild iron deficiency  Normal b12 / folate.    Prior hospitalization within past 30 days: No   Prior hospitalization within past 6 months: Yes  Comorbid conditions: alcoholism  Medical procedure(s) performed: IV hydration, IV antibiotic and abdominal paracentesis  Surgical procedure(s) performed: none  Diagnostic tests performed: chest x-ray, CT abdomen and CT pelvis  Pending tests: none  Home care: ADL's intact and family assisting with home health needs  Specialist follow-up: GI  Post-hospital monitoring: none  Mobility status: poor mobility and gait dysfunction     Medication  Reconciliation(required):  Hospital discharge medications reviewed and reconciled with current medication list.    Advanced Directives:  Not addressed today.    Problem List:  Patient Active Problem List   Diagnosis    Abnormal clinical finding    Alcohol dependence with alcohol-induced sleep disorder    Anxiety    Dizziness    Chronic gout without tophus, unspecified cause, unspecified site    Hyperlipidemia, unspecified hyperlipidemia type    Essential hypertension    Alcohol-induced acute pancreatitis    Ascites due to alcoholic cirrhosis    Cirrhosis    Neuropathy       Current Medications:  Current Outpatient Medications   Medication Sig Dispense Refill    folic acid (FOLVITE) 1 MG tablet Take 1 mg by mouth daily      furosemide (LASIX) 40 MG tablet Take 40 mg by mouth      gabapentin (NEURONTIN) 600 MG tablet Take 600 mg by mouth 4 (four) times daily      lactulose (CHRONULAC) 10 GM/15ML solution Take 20 g by mouth      loratadine (CLARITIN) 10 MG tablet Take 10 mg by mouth daily      Multiple Vitamin (MULTIVITAMIN PO) Take by mouth      spironolactone (ALDACTONE) 50 MG tablet Take 50 mg by mouth      dilTIAZem (CARDIZEM CD) 120 MG 24 hr capsule Take 1 capsule (120 mg total) by mouth daily 90 capsule 0    gabapentin (NEURONTIN) 300 MG capsule Take 1 capsule (300 mg total) by mouth 3 (three) times daily 90 capsule 0    Naproxen Sodium (ALEVE PO) Take by mouth      thiamine (B-1) 100 MG tablet Take 100 mg by mouth daily       Current Facility-Administered Medications   Medication Dose Route Frequency Provider Last Rate Last Admin    alias med E5 injection 380 mg  380 mg Intramuscular Q28 Days Miquel Dunn, RN   380 mg at 03/02/17 1415       Allergies:  No Known Allergies    Past Medical History:  Past Medical History:   Diagnosis Date  Anxiety        Past Surgical History:  Past Surgical History:   Procedure Laterality Date    WISDOM TOOTH EXTRACTION Bilateral 2008       Family  History:  Family History   Problem Relation Age of Onset    Cancer Maternal Grandmother         breast ca    Drug abuse Sister        Social History:  Social History     Tobacco Use    Smoking status: Current Every Day Smoker     Packs/day: 0.50     Types: Cigarettes    Smokeless tobacco: Never Used   Substance Use Topics    Alcohol use: Yes     Comment: Drinks 8-10 shots of liquor, 1 bottle of wine and 3-6 hard llemonades daily    Drug use: No        The following sections were reviewed this encounter by the provider:         ROS:  Review of Systems   Constitutional: Positive for fatigue.   Gastrointestinal: Negative for abdominal pain.   Neurological: Positive for weakness.       Vitals:  BP 109/69    Pulse 99    Temp 98.4 F (36.9 C) (Temporal)    Wt 52.6 kg (116 lb)    SpO2 98%    BMI 19.91 kg/m     Objective:     Physical Exam:  Physical Exam  Vitals and nursing note reviewed.   Constitutional:       General: She is not in acute distress.     Appearance: She is well-developed.   Eyes:      Conjunctiva/sclera: Conjunctivae normal.   Cardiovascular:      Rate and Rhythm: Normal rate and regular rhythm.      Heart sounds: Normal heart sounds.   Pulmonary:      Effort: Pulmonary effort is normal.      Breath sounds: Normal breath sounds.   Abdominal:      General: Abdomen is protuberant. Bowel sounds are increased. There is distension.      Palpations: Abdomen is soft. There is hepatomegaly (liver edge palpable and below CPA by percussion). There is no fluid wave or splenomegaly.      Tenderness: There is no abdominal tenderness.   Skin:     Comments: Possible early spider angioma noted on chest, slight erythema of palms   Neurological:      Mental Status: She is alert and oriented to person, place, and time.         Assessment:     1. Alcoholic cirrhosis of liver with ascites  - lactulose (CHRONULAC) 10 GM/15ML solution; Take 30 mLs (20 g total) by mouth 3 (three) times daily  Dispense: 2700 mL; Refill: 0   - CBC without differential  - Comprehensive metabolic panel  - Ammonia  - Lipase  - Ambulatory referral to Physical Therapy    2. Moderate episode of recurrent major depressive disorder    3. Thrombocytosis  - lactulose (CHRONULAC) 10 GM/15ML solution; Take 30 mLs (20 g total) by mouth 3 (three) times daily  Dispense: 2700 mL; Refill: 0  - CBC without differential  - Comprehensive metabolic panel  - Ammonia  - Lipase    4. Alcoholism  - lactulose (CHRONULAC) 10 GM/15ML solution; Take 30 mLs (20 g total) by mouth 3 (three) times daily  Dispense: 2700 mL; Refill: 0  -  CBC without differential  - Comprehensive metabolic panel  - Ammonia  - Lipase    5. Weakness  - Ambulatory referral to Physical Therapy    6. Neuropathy  - Ambulatory referral to Physical Therapy    7. Anxiety  - citalopram (CeleXA) 10 MG tablet; Take 1 tablet (10 mg total) by mouth every morning  Dispense: 30 tablet; Refill: 1    8. Hyperlipidemia, unspecified hyperlipidemia type  - Lipid panel    34 y.o. female with long history of alcoholism presents after recent hospital discharge for acute on chronic liver failure. Also likely had pancreatitis. Improved with paracentesis and treatment with lactulose as well as prednisolone, diuretics. Possibly also had component of alcohol withdrawal with acute mental status changes.   -needs appt with hepatology asap  -check labs as ordered  -consider resuming prednisolone if improvement in bili noted  -congratulated her on her sobriety so far  -strongly advised substance abuse program for long term success. Counseled to completely avoid alcohol.   -refer for PT as weakness and neuropathy worsened  -anxiety worsened, need to avoid cymbalta as she was previously on due to liver failure. Will do trial of low dose celexa that is safe in cirrhosis.   -continue lactulose TID and titrate to 3 soft stools daily  -continue lasix and spironolactone  -low salt diet  -resume thiamine 100mg  daily    Plan:   Disease/Illness  Education:  Patient educated regarding symptom management, adherence to current medication regimen and current plan of care. Patient encouraged to contact us with any questions or needs.  Patient agreeable to current plan of care and verbalizes understanding of information provided.  Home Health Needs:  No current home health services needed at this time.  Community Service Needs:  substance abuse Development worker, community of Referral Orders:  New referrals entered into EMR - see orders section  Communication with Health Care Providers:  No active communication with additional health care providers needed at this time.  [place] 90+ day Treatment Plan:  Not applicable    Transitional Care Management Coding Criteria:  TCM coding criteria not met.    Return in about 2 weeks (around 06/27/2019) for follow up current symptoms.    Mariann Laster, MD     The following activities were performed on the date of service:  preparing to see the patient: - chart review   - review of prior labs  - review of prior imaging tests  - review of prior ED visits  obtaining and/or reviewing the separately obtained history  performing a medically appropriate examination and/or evaluation   counseling and educating the patient/family/caregiver  ordering medications, tests, or procedures  referring and communicating with other health care professionals (when not separately reported)   documenting clinical information in the electronic or other health records    Total time spent performing activities on date of service:  45  minutes

## 2019-06-14 LAB — AMMONIA: Ammonia: 46 umol/L (ref 18–72)

## 2019-06-15 ENCOUNTER — Telehealth (INDEPENDENT_AMBULATORY_CARE_PROVIDER_SITE_OTHER): Payer: Medicaid Other | Admitting: Neurology

## 2019-06-15 ENCOUNTER — Telehealth (INDEPENDENT_AMBULATORY_CARE_PROVIDER_SITE_OTHER): Payer: Self-pay | Admitting: Internal Medicine

## 2019-06-15 ENCOUNTER — Encounter (HOSPITAL_BASED_OUTPATIENT_CLINIC_OR_DEPARTMENT_OTHER): Payer: Self-pay | Admitting: Neurology

## 2019-06-15 DIAGNOSIS — R531 Weakness: Secondary | ICD-10-CM

## 2019-06-15 DIAGNOSIS — M5126 Other intervertebral disc displacement, lumbar region: Secondary | ICD-10-CM

## 2019-06-15 NOTE — Telephone Encounter (Signed)
LVM for pt to return call

## 2019-06-15 NOTE — Progress Notes (Signed)
Subjective:      Patient ID: Erin Huber is a 34 y.o. female.      Verbal consent has been obtained from the patient to conduct a video visit encounter to minimize exposure to COVID-19.  The patient is a pleasant 34 years old female who is being followed via telephone for bilateral lower extremity paresthesias, weakness and difficulty walking and balancing. He was seen in October of last year, however symptoms were likely secondary to heavy alcohol abuse which she has been doing up until a month ago. As per the mother, she was recently admitted in the hospital for cirrhosis, she reports that she has stopped taking alcohol since last 1 month. She feels her symptoms have worsened since the last visit.    Prior note:  The symptoms have improved significantly, she is able to walk with the help of her walker or the cane and at home she is not using anything though it is not completely back to normal.  She reports that she is still drinking, confirmed by her mother.  She is thinking of going back to her home in [place] and the mother is concerned about her being alone out there.    Prior note:  She had extensive work-up including spinal tap, nerve conduction studies as well as MRI of the lumbar and thoracic spine.  The symptoms have been persistent, she reports having this sharp stabbing pain which are intermittent, not responding to gabapentin 600 mg 3 times a day.  She was also seen by neurosurgery and it was felt the symptoms could be related to neuropathy rather than a annular tear noted in the MRI lumbar spine.  As per the mother, she continues to drink though not heavily as she used to.  She is considering to cut down with time.     Prior note:  The patient is a pleasant 34 years old right-handed Caucasian female who is here for evaluation of bilateral leg weakness and paresthesias with difficulty walking and balancing.  The patient reports that about 3 months ago she started having tingling numbness and  paresthesias in the legs followed by weakness which over the period of next few weeks got worse.  She was evaluated in [place] and her symptoms were labeled to likely secondary to alcohol abuse.  She does admit to have history of alcohol abuse drinking about 5-6 small drinks on daily basis for many years.  She was found to have very low H&H, potassium and elevated LFTs which have improved now.  The bilateral leg weakness with pain, pins-and-needles, paresthesias, burning sensation have progressed to involve up to the stomach and belly as well as the fingers.  She is using the walker to walk, has been recommended physical therapy which has not been started yet.  She denies having any sickness prior to the onset of the symptoms, she reports that about 4 months ago she was able to walk normally without having any symptoms at all.  She has been taking gabapentin 800 mg 3 times a day without any help in the pain and paresthesias.    No Known Allergies    The following portions of the patient's history were reviewed and updated as appropriate: allergies, current medications, past family history, past medical history, past social history, past surgical history and problem list.    Past Medical History:   Diagnosis Date    Anxiety     Moderate episode of recurrent major depressive disorder 06/13/2019  Current Outpatient Medications on File Prior to Visit   Medication Sig Dispense Refill    citalopram (CeleXA) 10 MG tablet Take 1 tablet (10 mg total) by mouth every morning 30 tablet 1    furosemide (LASIX) 40 MG tablet Take 40 mg by mouth      gabapentin (NEURONTIN) 600 MG tablet Take 600 mg by mouth 4 (four) times daily      lactulose (CHRONULAC) 10 GM/15ML solution Take 30 mLs (20 g total) by mouth 3 (three) times daily 2700 mL 0    loratadine (CLARITIN) 10 MG tablet Take 10 mg by mouth daily      Multiple Vitamin (MULTIVITAMIN PO) Take by mouth      spironolactone (ALDACTONE) 50 MG tablet Take 50 mg by  mouth      thiamine (B-1) 100 MG tablet Take 100 mg by mouth daily       No current facility-administered medications on file prior to visit.     Review of Systems   Constitutional: Positive for activity change and fatigue.   HENT: Negative.    Eyes: Negative.    Respiratory: Negative.    Cardiovascular: Negative.    Gastrointestinal: Negative.    Endocrine: Negative.    Genitourinary: Negative.    Musculoskeletal: Positive for extremity weakness and gait problem.   Skin: Negative.    Allergic/Immunologic: Negative.    Neurological: Positive for weakness and numbness.   Hematological: Negative.    Psychiatric/Behavioral: Positive for decreased concentration, dysphoric mood and sleep disturbance. The patient is nervous/anxious.     All other systems were reviewed and are negative except as previously note din the HPI.          Family History   Problem Relation Age of Onset    Cancer Maternal Grandmother         breast ca    Drug abuse Sister      Objective:       Patient not seen as it was a telephone consultation    Spinal tap negative for elevated proteins    MRI of the lumbar and thoracic spine:  1. The thoracic spinal cord is normal in caliber and signal intensity,  and the conus medullaris terminates at a normal level.  2. There is no significant canal stenosis in the thoracolumbar spine.  3. 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      Assessment:     34 years old female with a history of heavy alcohol abuse presenting with bilateral lower extremity weakness that seems to be progressing with sensory level up to the mid abdomen.    Extensive work-up including the spinal tap for GBS was negative.  In the MRI lumbar spine there was annular tear with shallow central disc protrusion at L4-L5 but the neurosurgery did not felt the symptoms were related to the annular tear.      She feels the symptoms have worsened since the last visit, she does admit to have kept on drinking alcohol up until a  month ago. As per the mother, she was recently admitted in the hospital for cirrhosis and is getting GI work-up including endoscopy in the next few days  She was taking gabapentin 600 mg 4 times a day but by mistake received the prescription of 300 mg 3 times a day  She was recommended to increase the dose to 600 mg 3 times a day  She will follow up in person next week for detailed neurological  exam but was strongly recommended to cut down on alcohol which is likely the cause of her symptoms.  She will continue thiamine 100 mg once daily  Continue other medications as recommended  Follow-up in next week    Plan:       The patient will return in follow-up as outlined above. If there are any new neurologic symptoms or worsening of current symptoms, the patient is instructed to contact us by telephone.    Lynann Bologna, MD  Neurology, Neurophysiology  [facility]

## 2019-06-15 NOTE — Telephone Encounter (Signed)
patient is calling and wanting to know if the Dr. has reviewed her labs yet and would like a call back to go over [facility]    Please call the patient back     Thank you

## 2019-06-15 NOTE — Telephone Encounter (Signed)
Pt calls returning Caitlyn's call. Please advise    [phone]

## 2019-06-16 ENCOUNTER — Other Ambulatory Visit (FREE_STANDING_LABORATORY_FACILITY): Payer: Medicaid Other

## 2019-06-16 ENCOUNTER — Telehealth (INDEPENDENT_AMBULATORY_CARE_PROVIDER_SITE_OTHER): Payer: Self-pay | Admitting: Internal Medicine

## 2019-06-16 ENCOUNTER — Encounter (HOSPITAL_BASED_OUTPATIENT_CLINIC_OR_DEPARTMENT_OTHER): Payer: Self-pay | Admitting: Neurology

## 2019-06-16 ENCOUNTER — Ambulatory Visit (INDEPENDENT_AMBULATORY_CARE_PROVIDER_SITE_OTHER): Payer: Medicaid Other | Admitting: Neurology

## 2019-06-16 VITALS — BP 130/73 | HR 118 | Temp 97.7°F | Resp 18 | Ht 64.0 in | Wt 116.0 lb

## 2019-06-16 DIAGNOSIS — R29898 Other symptoms and signs involving the musculoskeletal system: Secondary | ICD-10-CM

## 2019-06-16 LAB — T3: T3: 0.79 ng/mL (ref 0.48–1.59)

## 2019-06-16 LAB — T3, FREE: T3, Free: 2.86 pg/mL (ref 1.71–3.71)

## 2019-06-16 LAB — HEMOLYSIS INDEX: Hemolysis Index: 0 (ref 0–18)

## 2019-06-16 LAB — VITAMIN B12: Vitamin B-12: 1070 pg/mL — ABNORMAL HIGH (ref 211–911)

## 2019-06-16 LAB — TSH: TSH: 3.43 u[IU]/mL (ref 0.35–4.94)

## 2019-06-16 LAB — T4, FREE: T4 Free: 1.05 ng/dL (ref 0.70–1.48)

## 2019-06-16 NOTE — Telephone Encounter (Signed)
Patient is returning call from caitlyn. Please return call at noted phone number below.      Patient Preferred Callback Number: [phone]

## 2019-06-16 NOTE — Telephone Encounter (Signed)
Patient missed a phone call from someone in the office to go over her lab results. Her contact number is [phone].  Please advise.

## 2019-06-16 NOTE — Telephone Encounter (Signed)
Spoke with patient, aware of labs 

## 2019-06-16 NOTE — Progress Notes (Signed)
Subjective:      Patient ID: Erin Huber is a 34 y.o. female.      Verbal consent has been obtained from the patient to conduct a video visit encounter to minimize exposure to COVID-19.  The patient is a pleasant 34 years old female who is being followed via telephone for bilateral lower extremity paresthesias, weakness and difficulty walking and balancing. She  was seen in October of last year, however symptoms were likely secondary to heavy alcohol abuse which she has been doing up until a month ago. As per the mother, she was recently admitted in the hospital for cirrhosis, she reports that she has stopped taking alcohol since last 1 month. She feels her symptoms have worsened since the last visit.    Prior note:  The symptoms have improved significantly, she is able to walk with the help of her walker or the cane and at home she is not using anything though it is not completely back to normal.  She reports that she is still drinking, confirmed by her mother.  She is thinking of going back to her home in [place] and the mother is concerned about her being alone out there.    Prior note:  She had extensive work-up including spinal tap, nerve conduction studies as well as MRI of the lumbar and thoracic spine.  The symptoms have been persistent, she reports having this sharp stabbing pain which are intermittent, not responding to gabapentin 600 mg 3 times a day.  She was also seen by neurosurgery and it was felt the symptoms could be related to neuropathy rather than a annular tear noted in the MRI lumbar spine.  As per the mother, she continues to drink though not heavily as she used to.  She is considering to cut down with time.     Prior note:  The patient is a pleasant 34 years old right-handed Caucasian female who is here for evaluation of bilateral leg weakness and paresthesias with difficulty walking and balancing.  The patient reports that about 3 months ago she started having tingling numbness  and paresthesias in the legs followed by weakness which over the period of next few weeks got worse.  She was evaluated in [place] and her symptoms were labeled to likely secondary to alcohol abuse.  She does admit to have history of alcohol abuse drinking about 5-6 small drinks on daily basis for many years.  She was found to have very low H&H, potassium and elevated LFTs which have improved now.  The bilateral leg weakness with pain, pins-and-needles, paresthesias, burning sensation have progressed to involve up to the stomach and belly as well as the fingers.  She is using the walker to walk, has been recommended physical therapy which has not been started yet.  She denies having any sickness prior to the onset of the symptoms, she reports that about 4 months ago she was able to walk normally without having any symptoms at all.  She has been taking gabapentin 800 mg 3 times a day without any help in the pain and paresthesias.    No Known Allergies    The following portions of the patient's history were reviewed and updated as appropriate: allergies, current medications, past family history, past medical history, past social history, past surgical history and problem list.    Past Medical History:   Diagnosis Date    Anxiety     Cirrhosis of liver     Hypothyroidism  Moderate episode of recurrent major depressive disorder 06/13/2019    Neuropathy      Current Outpatient Medications on File Prior to Visit   Medication Sig Dispense Refill    citalopram (CeleXA) 10 MG tablet Take 1 tablet (10 mg total) by mouth every morning 30 tablet 1    furosemide (LASIX) 40 MG tablet Take 40 mg by mouth daily         gabapentin (NEURONTIN) 300 MG capsule Take 600 mg by mouth 4 (four) times daily         lactulose (CHRONULAC) 10 GM/15ML solution Take 30 mLs (20 g total) by mouth 3 (three) times daily 2700 mL 0    loratadine (CLARITIN) 10 MG tablet Take 10 mg by mouth daily      Multiple Vitamin (MULTIVITAMIN  PO) Take 1 capsule by mouth daily Gummy version        spironolactone (ALDACTONE) 50 MG tablet Take 50 mg by mouth daily         thiamine (B-1) 100 MG tablet Take 100 mg by mouth daily       No current facility-administered medications on file prior to visit.     Review of Systems   Constitutional: Positive for activity change and fatigue.   HENT: Negative.    Eyes: Negative.    Respiratory: Negative.    Cardiovascular: Negative.    Gastrointestinal: Negative.    Endocrine: Negative.    Genitourinary: Negative.    Musculoskeletal: Positive for gait problem.   Skin: Negative.    Allergic/Immunologic: Negative.    Neurological: Positive for weakness and numbness.   Hematological: Negative.    Psychiatric/Behavioral: Positive for decreased concentration, dysphoric mood and sleep disturbance. The patient is nervous/anxious.     All other systems were reviewed and are negative except as previously note din the HPI.          Family History   Problem Relation Age of Onset    Cancer Maternal Grandmother         breast ca    Drug abuse Sister      Objective:       Patient not seen as it was a telephone consultation    Spinal tap negative for elevated proteins    MRI of the lumbar and thoracic spine:  1. The thoracic spinal cord is normal in caliber and signal intensity,  and the conus medullaris terminates at a normal level.  2. There is no significant canal stenosis in the thoracolumbar spine.  3. 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      Assessment:     34 years old female with a history of heavy alcohol abuse presenting with bilateral lower extremity weakness that seems to be progressing with sensory level up to the mid abdomen.    Extensive work-up including the spinal tap for GBS was negative.  In the MRI lumbar spine there was annular tear with shallow central disc protrusion at L4-L5 but the neurosurgery did not felt the symptoms were related to the annular tear.      She feels the symptoms  have worsened since the last visit, she does admit to have kept on drinking alcohol up until a month ago. As per the mother, she was recently admitted in the hospital for cirrhosis and is getting GI work-up including endoscopy in the next few days  She was taking gabapentin 600 mg 4 times a day but by mistake received the prescription of 300  mg 3 times a day  She was recommended to increase the dose to 600 mg 3 times a day  She will follow up in person next week for detailed neurological exam but was strongly recommended to cut down on alcohol which is likely the cause of her symptoms.  She will continue thiamine 100 mg once daily  Continue other medications as recommended  Follow-up in next week    Plan:       The patient will return in follow-up as outlined above. If there are any new neurologic symptoms or worsening of current symptoms, the patient is instructed to contact us by telephone.    Lynann Bologna, MD  Neurology, Neurophysiology  [facility]

## 2019-06-16 NOTE — Telephone Encounter (Signed)
Please call pt back to talk about results please reach out [phone]

## 2019-06-20 ENCOUNTER — Encounter (INDEPENDENT_AMBULATORY_CARE_PROVIDER_SITE_OTHER): Payer: Self-pay | Admitting: Internal Medicine

## 2019-06-20 ENCOUNTER — Ambulatory Visit (INDEPENDENT_AMBULATORY_CARE_PROVIDER_SITE_OTHER): Payer: Medicaid Other | Admitting: Internal Medicine

## 2019-06-20 VITALS — BP 101/70 | HR 96 | Temp 98.0°F | Wt 120.6 lb

## 2019-06-20 DIAGNOSIS — F419 Anxiety disorder, unspecified: Secondary | ICD-10-CM

## 2019-06-20 DIAGNOSIS — K7031 Alcoholic cirrhosis of liver with ascites: Secondary | ICD-10-CM

## 2019-06-20 NOTE — Progress Notes (Signed)
Have you seen any specialists/other providers since your last visit with [place]?    Yes  Gastro  Neuro  [facility]     [facility] preference verified?   Yes    The patient is due for pap smear, pneumonia vaccine and [facility] care plan

## 2019-06-20 NOTE — Progress Notes (Signed)
Subjective:      Patient ID: Erin Huber is a 34 y.o. female  Chief Complaint   Patient presents with    Cirrhosis     follow up, still not feeling any better    Bloated     stomach bloating, uncomfortable       Presents with mom who contributes to history.      HPI    Here to follow up about her current symptoms. Mostly regarding fairly new diagnosis of hepatic failure and cirrhosis.   Had some questions about the labs done last week, like elevated WBC.  Pt was called twice, but didn't remember conversation to discuss with mom. So mom has questions.   Saw GI last week. Had repeat labs. Hasn't seen results. Recommended endoscopy next week and scheduled for Wed. Had repeat labs. Not yet available.   Had a nosebleed last week. Took 30-45 min. Stopped eventually. Feels abdomen is tight and large since last week.   Saw neuro Dr. Chales Abrahams last week. Checked thyroid function tests and were normal so far. Has MRI brain next week and follow up after.       The following portions of the patient's history were reviewed and updated as appropriate: current medications, allergies, past family history, past medical history, past social history, past surgical history and problem list.     Review of Systems   Gastrointestinal: Positive for abdominal distention.   Psychiatric/Behavioral: Positive for confusion.         BP 101/70    Pulse 96    Temp 98 F (36.7 C) (Temporal)    Wt 54.7 kg (120 lb 9.6 oz)    LMP 06/02/2019 (Approximate)    BMI 20.70 kg/m    Objective:   Physical Exam   Constitutional: She is oriented to person, place, and time. She appears well-developed and well-nourished. No distress.   Eyes: Conjunctivae are normal.   Cardiovascular: Normal rate and regular rhythm. Exam reveals friction rub (occasional, not consistent).   Pulmonary/Chest: Effort normal and breath sounds normal.   Abdominal: Soft. She exhibits distension, fluid wave and ascites. She exhibits no mass. Bowel sounds are increased. There is  hepatomegaly. There is no splenomegaly. There is no abdominal tenderness. There is no rigidity and no guarding.   Neurological: She is alert and oriented to person, place, and time.   Psychiatric: She has a normal mood and affect.   Nursing note and vitals reviewed.      Assessment:     1. Alcoholic cirrhosis of liver with ascites     2. Ascites due to alcoholic cirrhosis     3. Anxiety           Plan:     34 y.o. female presents with alcoholic cirrhosis. Fairly new diagnosis, still undergoing work up. Also with worsening ascites.   -started on xifaxan by GI  -continues on lactulose, lasix, spironolactone, to be managed by GI  -plan for EGD this week to evaluated for varices.   -would follow up with GI for discussion about plan for paracentesis when needed.   -plan for MRI brain next week  -call for GI consult note and labs. Suspect elevated INR causing prolonged epistaxis.   -counseled on alcohol use. Congratulated for sober this far. Encouraged continued abstinence including if possible transplant indicated in the future.   -counseled on signs/symptoms to warrant re-evaluation     Return in about 4 weeks (around 07/18/2019) for follow up current symptoms.  Gust Brooms, MD      The following activities were performed on the date of service:  preparing to see the patient: - chart review   - review of prior labs  obtaining and/or reviewing the separately obtained history  performing a medically appropriate examination and/or evaluation   counseling and educating the patient/family/caregiver  documenting clinical information in the electronic or other health records    Total time spent performing activities on date of service:  40 min  minutes

## 2019-06-20 NOTE — Progress Notes (Addendum)
UPDATE 3/31: Called patient (with mother on speaker) and confirmed that labs were drawn at GI. When I called GI to get records, they sent office visit records (still waiting for that to get into Epic) but labs just aren't back yet. Endoscopy rescheduled for tomorrow morning (4/1).         Thyroid- [facility]. Had gone last     Saw GI last week and will have endoscopy on Wednesday.     She has had 1 nose bleed, and a scab that started bleeding on chin, 30 -45 mins per mother.     When saw neurologist, ordered MRI brain next Wednesday, followed by appointment    Lactulose   Furosemide and Spironolactone -

## 2019-06-20 NOTE — Patient Instructions (Signed)
Cirrhosis    The liver is found on the right side of your belly (abdomen). It'sjust below the rib cage. The liver has many important jobs. It removes toxins from the blood. It also helps your blood clot to stop bleeding.Cirrhosis happens when the liver is scarred or injured. This damage is permanent. It can cause your liver to stop working(liver failure).   The most common causes of cirrhosis are long-term heavy alcohol use and having hepatitis B or C. Other causes include nonalcoholic steatohepatitis (NASH or fatty liver disease), autoimmune disease, hemochromatosis, toxins, certain medicines, and certain viruses.   Common symptoms of cirrhosis include:   Tiredness or weakness   Loss of appetite   Nausea and vomiting   Easy bleeding and bruising   Swelling of the belly (abdomen)   Weight loss   Yellowing of the eyes or skin (jaundice)   Itching   Confusion  Treatment helps ease symptoms and prevent more liver damage. You may also get treatment to fight or cure the hepatitis virus. Quitting alcohol will help slow down the disease getting worse. It may also prevent more complications. If cirrhosis gets worse and becomes life threatening,you may need aliver transplant.   Home care   Don't take medicines that can makeliver damage worse.Your healthcare provider will tell youif any of the medicines you now take need to be changed. Talk with your provider or pharmacist before taking any medicine not prescribed. These include dietary supplementsand herbs. Some of these may makeliver damage worse.   Talk with your healthcare provider aboutmedicines that haveacetaminophen or NSAIDs such as ibuprofen and naproxen.These can also harm your liver.   Stop drinking alcohol.If youfind it hard to stop drinking, seek professional help. Consider joining Alcoholics Anonymousor another type of treatment programfor support.   If you use IV drugs, you are at high risk for hepatitis B and C. Seek help to  stop.   Be sure to ask your healthcare provider about recommended vaccines. These include vaccines for viruses that can cause liver disease.    Follow-up care  Follow up with your healthcare provider, or as advised. If you have cirrhosis, you may need more testing to look for complications, including liver cancer.   For more information and to learn about support groups for people with liver disease, contact:    [employer],www.liverfoundation.org,800-465-4837   Hepatitis [employer],,www.hepatitisfoundation.org, 800-891-0707  When to seek medical advice  Call your healthcare provider right away if you haveany of the following:   Rapid weight gain with increased size of your belly (abdomen) or leg swelling   Yellow color of your skin or eyes (jaundice) gets worse   Excess bleeding from cuts or injuries  [employer] last reviewed this educational content on 04/24/2018   2000-2020 [employer] [address], [place] [address]. All rights reserved. This information is not intended as a substitute for professional medical care. Always follow your healthcare professional's instructions.

## 2019-06-21 ENCOUNTER — Ambulatory Visit (INDEPENDENT_AMBULATORY_CARE_PROVIDER_SITE_OTHER): Payer: Medicaid Other

## 2019-06-21 DIAGNOSIS — Z23 Encounter for immunization: Secondary | ICD-10-CM

## 2019-06-22 LAB — VITAMIN D 1,25 DIHYDROXY: 1,25-Dihydroxyvitamin D, S: 14 pg/mL — ABNORMAL LOW (ref 18–78)

## 2019-06-22 LAB — REVERSE T3, SERUM LC/MS: T3, Reverse: 27 ng/dL — ABNORMAL HIGH (ref 10–24)

## 2019-06-22 LAB — VITAMIN B1, WHOLE BLOOD: Whole Blood Vitamin B1: 127 nmol/L (ref 70–180)

## 2019-06-23 ENCOUNTER — Encounter (HOSPITAL_BASED_OUTPATIENT_CLINIC_OR_DEPARTMENT_OTHER): Payer: Self-pay

## 2019-06-24 ENCOUNTER — Encounter (INDEPENDENT_AMBULATORY_CARE_PROVIDER_SITE_OTHER): Payer: Self-pay | Admitting: Internal Medicine

## 2019-06-27 ENCOUNTER — Telehealth (INDEPENDENT_AMBULATORY_CARE_PROVIDER_SITE_OTHER): Payer: Self-pay | Admitting: Internal Medicine

## 2019-06-27 NOTE — Telephone Encounter (Signed)
Informed patients mother that everything has been faxed

## 2019-06-27 NOTE — Telephone Encounter (Signed)
Pt mother called and would like to know if you all faxed over her daughters cholesterol bw and Dr notes from pcp to [facility] [address]  [phone]  Please call her mother at [phone]

## 2019-06-29 ENCOUNTER — Ambulatory Visit
Admission: RE | Admit: 2019-06-29 | Discharge: 2019-06-29 | Disposition: A | Discharge status: Home / Self Care | Payer: Medicaid Other | Source: Ambulatory Visit | Attending: Neurology | Admitting: Neurology

## 2019-06-29 DIAGNOSIS — R29898 Other symptoms and signs involving the musculoskeletal system: Secondary | ICD-10-CM | POA: Insufficient documentation

## 2019-06-30 ENCOUNTER — Encounter (INDEPENDENT_AMBULATORY_CARE_PROVIDER_SITE_OTHER): Payer: Self-pay

## 2019-06-30 ENCOUNTER — Encounter (HOSPITAL_BASED_OUTPATIENT_CLINIC_OR_DEPARTMENT_OTHER): Payer: Self-pay | Admitting: Neurology

## 2019-06-30 ENCOUNTER — Other Ambulatory Visit (HOSPITAL_BASED_OUTPATIENT_CLINIC_OR_DEPARTMENT_OTHER): Payer: Self-pay

## 2019-06-30 ENCOUNTER — Ambulatory Visit (INDEPENDENT_AMBULATORY_CARE_PROVIDER_SITE_OTHER): Payer: Medicaid Other | Admitting: Neurology

## 2019-06-30 VITALS — BP 100/69 | HR 99 | Resp 20 | Ht 64.0 in | Wt 120.0 lb

## 2019-06-30 DIAGNOSIS — R29898 Other symptoms and signs involving the musculoskeletal system: Secondary | ICD-10-CM

## 2019-06-30 MED ORDER — GABAPENTIN 300 MG PO CAPS
600.00 mg | ORAL_CAPSULE | Freq: Three times a day (TID) | ORAL | 2 refills | Status: DC
Start: 2019-06-30 — End: 2019-10-18

## 2019-06-30 MED ORDER — VITAMIN D (ERGOCALCIFEROL) 1.25 MG (50000 UT) PO CAPS
50000.00 [IU] | ORAL_CAPSULE | ORAL | 1 refills | Status: AC
Start: 2019-06-30 — End: ?

## 2019-06-30 NOTE — Progress Notes (Signed)
Subjective:      Patient ID: Erin Huber is a 34 y.o. female.      The patient is a pleasant 34 years old female who is is here for bilateral lower extremity paresthesias, weakness and difficulty walking and balancing.  She continues to have the symptoms, had further work-up which showed low vitamin D.  She also had the MRI which is reported as normal.  She also had upper GI endoscopy and was found to have ulcers, being followed by gastroenterology.  Her symptoms are felt to be secondary to heavy alcohol abuse which she has been doing up until a month ago. As per the mother, she was recently admitted in the hospital for cirrhosis, she reports that she has stopped taking alcohol since last 1 month. She feels her symptoms have worsened since the last visit.    Prior note:  The symptoms have improved significantly, she is able to walk with the help of her walker or the cane and at home she is not using anything though it is not completely back to normal.  She reports that she is still drinking, confirmed by her mother.  She is thinking of going back to her home in [place] and the mother is concerned about her being alone out there.    Prior note:  She had extensive work-up including spinal tap, nerve conduction studies as well as MRI of the lumbar and thoracic spine.  The symptoms have been persistent, she reports having this sharp stabbing pain which are intermittent, not responding to gabapentin 600 mg 3 times a day.  She was also seen by neurosurgery and it was felt the symptoms could be related to neuropathy rather than a annular tear noted in the MRI lumbar spine.  As per the mother, she continues to drink though not heavily as she used to.  She is considering to cut down with time.     Prior note:  The patient is a pleasant 33 years old right-handed Caucasian female who is here for evaluation of bilateral leg weakness and paresthesias with difficulty walking and balancing.  The patient reports that  about 3 months ago she started having tingling numbness and paresthesias in the legs followed by weakness which over the period of next few weeks got worse.  She was evaluated in [place] and her symptoms were labeled to likely secondary to alcohol abuse.  She does admit to have history of alcohol abuse drinking about 5-6 small drinks on daily basis for many years.  She was found to have very low H&H, potassium and elevated LFTs which have improved now.  The bilateral leg weakness with pain, pins-and-needles, paresthesias, burning sensation have progressed to involve up to the stomach and belly as well as the fingers.  She is using the walker to walk, has been recommended physical therapy which has not been started yet.  She denies having any sickness prior to the onset of the symptoms, she reports that about 4 months ago she was able to walk normally without having any symptoms at all.  She has been taking gabapentin 800 mg 3 times a day without any help in the pain and paresthesias.    No Known Allergies    The following portions of the patient's history were reviewed and updated as appropriate: allergies, current medications, past family history, past medical history, past social history, past surgical history and problem list.    Past Medical History:   Diagnosis Date  Alcohol-induced acute pancreatitis 06/01/2019    Anxiety     Cirrhosis of liver     Hypothyroidism     Moderate episode of recurrent major depressive disorder 06/13/2019    Neuropathy      Current Outpatient Medications on File Prior to Visit   Medication Sig Dispense Refill    citalopram (CeleXA) 10 MG tablet Take 1 tablet (10 mg total) by mouth every morning 30 tablet 1    furosemide (LASIX) 40 MG tablet Take 40 mg by mouth daily         gabapentin (NEURONTIN) 300 MG capsule Take 600 mg by mouth 4 (four) times daily         lactulose (CHRONULAC) 10 GM/15ML solution Take 30 mLs (20 g total) by mouth 3 (three) times daily 2700 mL 0     loratadine (CLARITIN) 10 MG tablet Take 10 mg by mouth daily      Multiple Vitamin (MULTIVITAMIN PO) Take 1 capsule by mouth daily Gummy version        spironolactone (ALDACTONE) 50 MG tablet Take 50 mg by mouth daily         thiamine (B-1) 100 MG tablet Take 100 mg by mouth daily      Xifaxan 550 MG Tab 550 mg 2 (two) times daily       No current facility-administered medications on file prior to visit.     Review of Systems   Constitutional: Positive for activity change and fatigue.   HENT: Negative.    Eyes: Negative.    Respiratory: Negative.    Cardiovascular: Negative.    Gastrointestinal: Negative.    Endocrine: Negative.    Genitourinary: Negative.    Musculoskeletal: Positive for gait problem.   Skin: Negative.    Allergic/Immunologic: Negative.    Neurological: Positive for weakness and numbness.   Hematological: Negative.    Psychiatric/Behavioral: Positive for decreased concentration, dysphoric mood and sleep disturbance. The patient is nervous/anxious.     All other systems were reviewed and are negative except as previously note din the HPI.          Family History   Problem Relation Age of Onset    Cancer Maternal Grandmother         breast ca    Drug abuse Sister      Objective:       Mental Status: The patient was awake, alert, and oriented. Conversation   was appropriate. Speech was fluent, and the patient followed commands   consistently.  Cranial Nerves: Pupils were equally round and reactive to light. Extraocular  movements were intact, without nystagmus. The patients face was symmetric,   and facial sensation was intact. Tongue and palate were midline.   Motor Exam: The patient had generalized weakness, lower extremities more than upper. Tone and bulk appeared   normal. There were no abnormal movements or tremor noted.   Sensation: Light touch paired with reduced vibration and pin prick.  Coordination: Finger-to-nose and heel-to-shin testing was impaired.   Gait: Ataxic, wide based    Deep Tendon Reflexes: Reduced to absent in the knees and ankles   Toes were downgoing.    MRI brain: There are small FLAIR hyperintensities in the white matter  of the cerebral hemispheres which are nonspecific. Diagnostic  possibilities include but are not limited to the sequela of migraine  headaches, demyelination, Lyme disease, vasculitis or a prior  infectious/inflammatory process.    Spinal tap negative for elevated proteins  MRI of the lumbar and thoracic spine:  1. The thoracic spinal cord is normal in caliber and signal intensity,  and the conus medullaris terminates at a normal level.  2. There is no significant canal stenosis in the thoracolumbar spine.  3. 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      Assessment:     34 years old female gait imbalance likely secondary to neuropathy  History of heavy alcohol abuse  Vitamin D deficiency  Severe anemia  Cirrhosis    With a history of heavy alcohol abuse presenting with bilateral lower extremity weakness that seems to be progressing.    Extensive work-up including the spinal tap for GBS was negative.  In the MRI lumbar spine there was annular tear with shallow central disc protrusion at L4-L5 but the neurosurgery did not felt the symptoms were related to the annular tear.      She feels the symptoms have worsened since the last visit, she does admit to have kept on drinking alcohol up until a month ago.   Will continue Gabapentin 600 mg 3 times a day  Physical therapy for difficulty walking, balancing  She will continue thiamine 100 mg a day  Will start vitamin D 50,000 units once weekly  She will continue to follow-up with the gastroenterology for gastric ulcer treatment  Follow-up with primary care doctor for anemia  Continue other medications as recommended  Follow-up in 6 weeks    Plan:       The patient will return in follow-up as outlined above. If there are any new neurologic symptoms or worsening of current symptoms, the patient  is instructed to contact us by telephone.    Lynann Bologna, MD  Neurology, Neurophysiology   [facility]

## 2019-07-01 ENCOUNTER — Encounter (INDEPENDENT_AMBULATORY_CARE_PROVIDER_SITE_OTHER): Payer: Self-pay | Admitting: Internal Medicine

## 2019-07-05 ENCOUNTER — Telehealth (INDEPENDENT_AMBULATORY_CARE_PROVIDER_SITE_OTHER): Payer: Self-pay | Admitting: Internal Medicine

## 2019-07-05 ENCOUNTER — Other Ambulatory Visit (INDEPENDENT_AMBULATORY_CARE_PROVIDER_SITE_OTHER): Payer: Self-pay | Admitting: Internal Medicine

## 2019-07-05 DIAGNOSIS — F419 Anxiety disorder, unspecified: Secondary | ICD-10-CM

## 2019-07-05 DIAGNOSIS — E785 Hyperlipidemia, unspecified: Secondary | ICD-10-CM

## 2019-07-05 NOTE — Telephone Encounter (Signed)
Pt mother Pam calls stating she was advised by pcp to see the gastroenterologist to manage cholesterol, but pt was advised by gastroenterologist that the they are not able to do that.     They advised pt to contact pcp to help manage cholesterol. Pt mother would like to know what pcp recommends for pt.     Please advise.   [phone].          [id number] [place], [place] - [id number] Phone:  [phone]   Fax:  [phone]

## 2019-07-06 NOTE — Telephone Encounter (Signed)
Refill sent.

## 2019-07-07 NOTE — Telephone Encounter (Signed)
Do you need VV to discuss?

## 2019-07-07 NOTE — Telephone Encounter (Signed)
I asked for GI opinion if statin is contra-indicated given her cirrhosis. Not for them to manage. Please call the GI office to clarify the question.

## 2019-07-08 ENCOUNTER — Encounter (INDEPENDENT_AMBULATORY_CARE_PROVIDER_SITE_OTHER): Payer: Self-pay

## 2019-07-14 NOTE — Telephone Encounter (Signed)
Tried calling GI- closed will call back Monday

## 2019-07-14 NOTE — Telephone Encounter (Signed)
Mother aware of status will call her back as soon as I have information

## 2019-07-14 NOTE — Telephone Encounter (Signed)
Pt mother Pam call again requesting for an update on her message from 07/05/19. Please advise    Rinaldo Cloud   [phone]

## 2019-07-22 ENCOUNTER — Telehealth (INDEPENDENT_AMBULATORY_CARE_PROVIDER_SITE_OTHER): Payer: Self-pay | Admitting: Internal Medicine

## 2019-07-22 NOTE — Telephone Encounter (Signed)
Spoke with Agustin Cree- Dr. Maple Hudson nurse she returns to the office on Tuesday and will get back to me then advised she can ask to speak with me directly.

## 2019-07-22 NOTE — Telephone Encounter (Signed)
Updated mother- see previous encounter

## 2019-07-22 NOTE — Telephone Encounter (Signed)
Pt's Mother called requesting to speak to MA Caitlyn, was advised pt's mother will get a call back by today 04/30. please call back at [phone].    Thank you.

## 2019-07-25 NOTE — Telephone Encounter (Signed)
Erin Huber called back to let us know doctor is advising low dose of rosuvastatin or pravastatin with close monitoring of liver labs.

## 2019-07-26 MED ORDER — ROSUVASTATIN CALCIUM 10 MG PO TABS
10.0000 mg | ORAL_TABLET | Freq: Every day | ORAL | 0 refills | Status: DC
Start: 2019-07-26 — End: 2019-08-30

## 2019-07-26 NOTE — Telephone Encounter (Signed)
Placed order for crestor 5mg  daily. Prescription sent. She should take in the evening to minimize potential side effect of muscle aches (usually very well tolerated). She should avoid grapefruit products as they interact with statins. She should not get pregnant on this medication. She needs to consider contraceptive options with GYN.   We should recheck her labs in 3 months.

## 2019-07-26 NOTE — Addendum Note (Signed)
Addended by: Mariann Laster on: 07/26/2019 04:10 PM     Modules accepted: Orders

## 2019-07-27 ENCOUNTER — Telehealth (INDEPENDENT_AMBULATORY_CARE_PROVIDER_SITE_OTHER): Payer: Self-pay | Admitting: Internal Medicine

## 2019-07-27 NOTE — Telephone Encounter (Signed)
Patients mother aware  

## 2019-07-27 NOTE — Telephone Encounter (Signed)
Pt return phone call with no VM just a missed call from office

## 2019-07-28 NOTE — Telephone Encounter (Signed)
See patient note

## 2019-08-17 ENCOUNTER — Other Ambulatory Visit (HOSPITAL_BASED_OUTPATIENT_CLINIC_OR_DEPARTMENT_OTHER): Payer: Self-pay

## 2019-08-17 DIAGNOSIS — R29898 Other symptoms and signs involving the musculoskeletal system: Secondary | ICD-10-CM

## 2019-08-17 MED ORDER — GABAPENTIN 600 MG PO TABS
600.0000 mg | ORAL_TABLET | Freq: Three times a day (TID) | ORAL | 0 refills | Status: DC
Start: 2019-08-17 — End: 2019-09-28

## 2019-08-17 NOTE — Telephone Encounter (Signed)
Patient left voicemail gabapentin needs refill. LOV 4.8.21

## 2019-08-18 ENCOUNTER — Encounter (HOSPITAL_BASED_OUTPATIENT_CLINIC_OR_DEPARTMENT_OTHER): Payer: Self-pay

## 2019-08-18 ENCOUNTER — Telehealth (HOSPITAL_BASED_OUTPATIENT_CLINIC_OR_DEPARTMENT_OTHER): Payer: Self-pay

## 2019-08-18 NOTE — Telephone Encounter (Signed)
Patient left voicemail indicating she has additional question.     Returned patient's call, states no further question or concerns.

## 2019-08-18 NOTE — Telephone Encounter (Addendum)
Mother left voicemail regarding gabapentin refill and inquired if Vit D needs refill.     Attempted to contact patient, recording indicates she "is not available try call again later."     Spoke with patient, advised gabapentin order sent to pharmacy 5/26, Vitamin D was weekly capsule with 1 refill, follow up appointment needed. Patient verbalized understanding.       Call transferred to call center to schedule follow up appointment.

## 2019-08-19 ENCOUNTER — Telehealth (INDEPENDENT_AMBULATORY_CARE_PROVIDER_SITE_OTHER): Payer: Self-pay | Admitting: Internal Medicine

## 2019-08-19 DIAGNOSIS — Z23 Encounter for immunization: Secondary | ICD-10-CM

## 2019-08-19 NOTE — Telephone Encounter (Signed)
Pt is requesting from PCP an order for Hepatitis A  shot as per specialist       Pls notify pt once order has been placed      Pt info  [phone] (M)      Thank you

## 2019-08-23 NOTE — Telephone Encounter (Signed)
Confirmed PA Marcelino Duster with [facility] Is recommending Hep A vaccination.

## 2019-08-23 NOTE — Telephone Encounter (Signed)
Orders signed.

## 2019-08-23 NOTE — Telephone Encounter (Signed)
Are we able to place order for this?  Will notify patient.

## 2019-08-23 NOTE — Telephone Encounter (Signed)
Please call GI and confirm that this is what they are requesting. If so, I'm happy to place the orders.

## 2019-08-23 NOTE — Telephone Encounter (Signed)
Pt came to the office to fu om previous message

## 2019-08-24 NOTE — Telephone Encounter (Signed)
Spoke with patient, scheduled nurse visit

## 2019-08-26 ENCOUNTER — Telehealth (HOSPITAL_BASED_OUTPATIENT_CLINIC_OR_DEPARTMENT_OTHER): Payer: Medicaid Other | Admitting: Neurology

## 2019-08-29 ENCOUNTER — Ambulatory Visit (INDEPENDENT_AMBULATORY_CARE_PROVIDER_SITE_OTHER): Payer: Medicaid Other

## 2019-08-29 ENCOUNTER — Other Ambulatory Visit (INDEPENDENT_AMBULATORY_CARE_PROVIDER_SITE_OTHER): Payer: Self-pay

## 2019-08-29 DIAGNOSIS — Z23 Encounter for immunization: Secondary | ICD-10-CM

## 2019-08-29 NOTE — Progress Notes (Signed)
Vaccination administration:  Patient presented to the office for the following vaccination administration:    Hepatitis A given in the Left deltoid    No reactions were noted and patient left in good condition.

## 2019-08-30 ENCOUNTER — Encounter (INDEPENDENT_AMBULATORY_CARE_PROVIDER_SITE_OTHER): Payer: Self-pay | Admitting: Internal Medicine

## 2019-08-30 ENCOUNTER — Encounter: Payer: Self-pay | Admitting: Internal Medicine

## 2019-08-30 ENCOUNTER — Other Ambulatory Visit (INDEPENDENT_AMBULATORY_CARE_PROVIDER_SITE_OTHER): Payer: Self-pay | Admitting: Internal Medicine

## 2019-08-30 DIAGNOSIS — E785 Hyperlipidemia, unspecified: Secondary | ICD-10-CM

## 2019-08-30 NOTE — Telephone Encounter (Signed)
Last filled May 2021.   Last o/v March 2021.  Patient does not have an upcoming appointment.  She will be due back in 10/2019 for labs.  Queued up 30 with 0 refills.

## 2019-08-31 NOTE — Telephone Encounter (Signed)
Refill sent.

## 2019-09-14 ENCOUNTER — Telehealth (INDEPENDENT_AMBULATORY_CARE_PROVIDER_SITE_OTHER): Payer: Medicaid Other | Admitting: Neurology

## 2019-09-14 DIAGNOSIS — R29898 Other symptoms and signs involving the musculoskeletal system: Secondary | ICD-10-CM

## 2019-09-14 MED ORDER — DULOXETINE HCL 30 MG PO CPEP
30.00 mg | ORAL_CAPSULE | Freq: Two times a day (BID) | ORAL | 2 refills | Status: DC
Start: 2019-09-14 — End: 2019-10-06

## 2019-09-14 NOTE — Progress Notes (Signed)
Subjective:      Patient ID: Erin Huber is a 34 y.o. female.      The patient is a pleasant 34 years old female who is is here for bilateral lower extremity paresthesias, weakness and difficulty walking and balancing.  The dose of gabapentin was tapered up to 600 mg 3 times a day, it has not helped much.,  She continues to have tingling numbness and burning sensation with loss of balance.  She is unable to sleep better.  She reports that she has not had any alcohol since February of this year.    She continues to have the symptoms, had further work-up which showed low vitamin D.  She also had the MRI which is reported as normal.  She also had upper GI endoscopy and was found to have ulcers, being followed by gastroenterology.  Her symptoms are felt to be secondary to heavy alcohol abuse which she has been doing up until a month ago. As per the mother, she was recently admitted in the hospital for cirrhosis, she reports that she has stopped taking alcohol since last 1 month. She feels her symptoms have worsened since the last visit.    Prior note:  The symptoms have improved significantly, she is able to walk with the help of her walker or the cane and at home she is not using anything though it is not completely back to normal.  She reports that she is still drinking, confirmed by her mother.  She is thinking of going back to her home in [place] and the mother is concerned about her being alone out there.    Prior note:  She had extensive work-up including spinal tap, nerve conduction studies as well as MRI of the lumbar and thoracic spine.  The symptoms have been persistent, she reports having this sharp stabbing pain which are intermittent, not responding to gabapentin 600 mg 3 times a day.  She was also seen by neurosurgery and it was felt the symptoms could be related to neuropathy rather than a annular tear noted in the MRI lumbar spine.  As per the mother, she continues to drink though not heavily  as she used to.  She is considering to cut down with time.     Prior note:  The patient is a pleasant 34 years old right-handed Caucasian female who is here for evaluation of bilateral leg weakness and paresthesias with difficulty walking and balancing.  The patient reports that about 3 months ago she started having tingling numbness and paresthesias in the legs followed by weakness which over the period of next few weeks got worse.  She was evaluated in [place] and her symptoms were labeled to likely secondary to alcohol abuse.  She does admit to have history of alcohol abuse drinking about 5-6 small drinks on daily basis for many years.  She was found to have very low H&H, potassium and elevated LFTs which have improved now.  The bilateral leg weakness with pain, pins-and-needles, paresthesias, burning sensation have progressed to involve up to the stomach and belly as well as the fingers.  She is using the walker to walk, has been recommended physical therapy which has not been started yet.  She denies having any sickness prior to the onset of the symptoms, she reports that about 4 months ago she was able to walk normally without having any symptoms at all.  She has been taking gabapentin 800 mg 3 times a day without any  help in the pain and paresthesias.    No Known Allergies    The following portions of the patient's history were reviewed and updated as appropriate: allergies, current medications, past family history, past medical history, past social history, past surgical history and problem list.    Past Medical History:   Diagnosis Date    Alcohol-induced acute pancreatitis 06/01/2019    Anxiety     Cirrhosis of liver     Hypothyroidism     Moderate episode of recurrent major depressive disorder 06/13/2019    Neuropathy      Current Outpatient Medications on File Prior to Visit   Medication Sig Dispense Refill    citalopram (CeleXA) 10 MG tablet TAKE 1 TABLET BY MOUTH EVERY DAY IN THE MORNING 90  tablet 1    furosemide (LASIX) 40 MG tablet Take 40 mg by mouth daily         gabapentin (NEURONTIN) 300 MG capsule Take 2 capsules (600 mg total) by mouth 3 (three) times daily 90 capsule 2    gabapentin (NEURONTIN) 600 MG tablet Take 1 tablet (600 mg total) by mouth 3 (three) times daily 90 tablet 0    loratadine (CLARITIN) 10 MG tablet Take 10 mg by mouth daily      Multiple Vitamin (MULTIVITAMIN PO) Take 1 capsule by mouth daily Gummy version        rosuvastatin (CRESTOR) 10 MG tablet TAKE 1 TABLET BY MOUTH EVERY DAY 90 tablet 0    spironolactone (ALDACTONE) 50 MG tablet Take 50 mg by mouth daily         thiamine (B-1) 100 MG tablet Take 100 mg by mouth daily      vitamin D, ergocalciferol, (DRISDOL) 50000 UNIT Cap Take 1 capsule (50,000 Units total) by mouth once a week 10 capsule 1    Xifaxan 550 MG Tab 550 mg 2 (two) times daily       No current facility-administered medications on file prior to visit.     Review of Systems   Constitutional: Positive for activity change and fatigue.   HENT: Negative.    Eyes: Negative.    Respiratory: Negative.    Cardiovascular: Negative.    Gastrointestinal: Negative.    Endocrine: Negative.    Genitourinary: Negative.    Musculoskeletal: Positive for gait problem.   Skin: Negative.    Allergic/Immunologic: Negative.    Neurological: Positive for weakness and numbness.   Hematological: Negative.    Psychiatric/Behavioral: Positive for decreased concentration, dysphoric mood and sleep disturbance. The patient is nervous/anxious.     All other systems were reviewed and are negative except as previously note din the HPI.          Family History   Problem Relation Age of Onset    Cancer Maternal Grandmother         breast ca    Drug abuse Sister      Objective:       Patient not seen as it was a telephone consultation.    MRI brain: There are small FLAIR hyperintensities in the white matter  of the cerebral hemispheres which are nonspecific.  Diagnostic  possibilities include but are not limited to the sequela of migraine  headaches, demyelination, Lyme disease, vasculitis or a prior  infectious/inflammatory process.    Spinal tap negative for elevated proteins    MRI of the lumbar and thoracic spine:  1. The thoracic spinal cord is normal in caliber and signal intensity,  and  the conus medullaris terminates at a normal level.  2. There is no significant canal stenosis in the thoracolumbar spine.  3. 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      Assessment:     33 years old female gait imbalance likely secondary to neuropathy  History of heavy alcohol abuse  Vitamin D deficiency  Severe anemia  Cirrhosis    With a history of heavy alcohol abuse presenting with bilateral lower extremity weakness that seems to be progressing.    Extensive work-up including the spinal tap for GBS was negative.  In the MRI lumbar spine there was annular tear with shallow central disc protrusion at L4-L5 but the neurosurgery did not felt the symptoms were related to the annular tear.      She feels the symptoms have worsened since the last visit, she reports that she has not had any alcohol in the last few months, gabapentin 600 mg 3 times a day is not helping.  Will start Cymbalta 30 mg twice daily and monitor for any improvement in the symptoms  She was recommended to reduce the dose of gabapentin to 600 mg twice daily    Physical therapy for difficulty walking, balancing  She will continue thiamine 100 mg a day  Will start vitamin D 50,000 units once weekly  She will continue to follow-up with the gastroenterology for gastric ulcer treatment  Follow-up with primary care doctor for anemia  Continue other medications as recommended  Follow-up in 6 weeks    Plan:       The patient will return in follow-up as outlined above. If there are any new neurologic symptoms or worsening of current symptoms, the patient is instructed to contact us by  telephone.    Lynann Bologna, MD  Neurology, Neurophysiology  [facility]

## 2019-09-21 ENCOUNTER — Encounter (INDEPENDENT_AMBULATORY_CARE_PROVIDER_SITE_OTHER): Payer: Self-pay | Admitting: Internal Medicine

## 2019-09-28 ENCOUNTER — Other Ambulatory Visit (HOSPITAL_BASED_OUTPATIENT_CLINIC_OR_DEPARTMENT_OTHER): Payer: Self-pay

## 2019-09-28 DIAGNOSIS — R29898 Other symptoms and signs involving the musculoskeletal system: Secondary | ICD-10-CM

## 2019-09-28 MED ORDER — GABAPENTIN 600 MG PO TABS
600.0000 mg | ORAL_TABLET | Freq: Three times a day (TID) | ORAL | 2 refills | Status: DC
Start: 2019-09-28 — End: 2020-01-20

## 2019-10-03 ENCOUNTER — Telehealth (HOSPITAL_BASED_OUTPATIENT_CLINIC_OR_DEPARTMENT_OTHER): Payer: Self-pay

## 2019-10-03 NOTE — Telephone Encounter (Signed)
Mother stated gabapentin is written as 600 mg 1 cap tid, per recent video visit medication was to be decreased.     Per 09/14/19 notes:   Assessment:     She feels the symptoms have worsened since the last visit, she reports that she has not had any alcohol in the last few months, gabapentin 600 mg 3 times a day is not helping.  Will start Cymbalta 30 mg twice daily and monitor for any improvement in the symptoms  She was recommended to reduce the dose of gabapentin to 600 mg twice daily    Patient's mother verbalized understanding.

## 2019-10-05 ENCOUNTER — Encounter (INDEPENDENT_AMBULATORY_CARE_PROVIDER_SITE_OTHER): Payer: Self-pay

## 2019-10-06 ENCOUNTER — Other Ambulatory Visit (HOSPITAL_BASED_OUTPATIENT_CLINIC_OR_DEPARTMENT_OTHER): Payer: Self-pay | Admitting: Neurology

## 2019-10-07 ENCOUNTER — Encounter (INDEPENDENT_AMBULATORY_CARE_PROVIDER_SITE_OTHER): Payer: Self-pay

## 2019-10-18 ENCOUNTER — Ambulatory Visit (INDEPENDENT_AMBULATORY_CARE_PROVIDER_SITE_OTHER): Payer: Medicaid Other | Admitting: Internal Medicine

## 2019-10-18 ENCOUNTER — Encounter (INDEPENDENT_AMBULATORY_CARE_PROVIDER_SITE_OTHER): Payer: Self-pay | Admitting: Internal Medicine

## 2019-10-18 VITALS — BP 117/81 | HR 99 | Wt 123.0 lb

## 2019-10-18 DIAGNOSIS — R8290 Unspecified abnormal findings in urine: Secondary | ICD-10-CM

## 2019-10-18 DIAGNOSIS — K7031 Alcoholic cirrhosis of liver with ascites: Secondary | ICD-10-CM

## 2019-10-18 DIAGNOSIS — F1020 Alcohol dependence, uncomplicated: Secondary | ICD-10-CM

## 2019-10-18 LAB — POCT URINALYSIS DIPSTIX (10)(MULTI-TEST)
Bilirubin, UA POCT: NEGATIVE
Blood, UA POCT: NEGATIVE
Ketones, UA POCT: NEGATIVE mg/dL
Nitrite, UA POCT: NEGATIVE
POCT Leukocytes, UA: NEGATIVE
POCT Spec Gravity, UA: 1.015 (ref 1.001–1.035)
POCT pH, UA: 6 (ref 5–8)
Protein, UA POCT: NEGATIVE mg/dL
Urobilinogen, UA: 0.2 mg/dL

## 2019-10-18 NOTE — Progress Notes (Signed)
Subjective:      Patient ID: Erin Huber is a 34 y.o. female  Chief Complaint   Patient presents with    Cirrhosis    Urinary Tract Infection Symptoms      Pt presents with mom who contributes to history.      HPI    Here for follow up. Had recent ER visit. Pt states she does not know why she's here and initially has no concerns to discuss when asked why she is here today.   Mom says she has relapsed and has started drinking again in the past two weeks. She is mean to her and feels she can no longer live with her like this. Pts sister who had previous problems with addiction as well came up to help, but she did not want help. Sister came to home and wanted her to leave mom's for concern of mom. They were concerned for mom and advised her to call police and try to evict her. Since this is pts residence, she will have to go through the formal eviction process. They recommended that she go to the ER for mental health eval. Went to ER. When she was there, she did not stay long enough for the formal evaluation. Pt does not feel she has an addiction problem. She feels she just drank one or two. She was sober for about 4 months prior to drinking again and she got stressed out. Since she started drinking, relationship with mom is worse and that continues to stress her out.   She knows that drinking affects the liver and states she knows that it affects her treatment options for cirrhosis.   Mom states that she has yelled at her to buy her alcohol or give her money for alcohol. She goes to the neighbors and drinks there. Pt denies this.   Mom also notes that     Pt left during part of the visit bc she was upset, but then returned to be able to continue the discussion.     In the ED they noted she may have a UTI and recommended eval with PCP. She has had no dysuria, frequency, or urgency.     The following portions of the patient's history were reviewed and updated as appropriate: current medications, allergies, past  family history, past medical history, past social history, past surgical history and problem list.     Review of Systems   Constitutional: Negative.    Respiratory: Negative.    Cardiovascular: Negative.    Genitourinary: Negative for dysuria, frequency, hematuria and urgency.   Psychiatric/Behavioral: Positive for agitation. The patient is nervous/anxious.          BP 117/81    Pulse 99    Wt 55.8 kg (123 lb)    SpO2 97%    BMI 21.11 kg/m    Objective:   Physical Exam  Vitals and nursing note reviewed.   Constitutional:       General: She is not in acute distress.     Appearance: She is well-developed.   Eyes:      Conjunctiva/sclera: Conjunctivae normal.   Cardiovascular:      Rate and Rhythm: Normal rate and regular rhythm.      Heart sounds: Normal heart sounds.   Pulmonary:      Effort: Pulmonary effort is normal.      Breath sounds: Normal breath sounds.   Abdominal:      General: Bowel sounds are normal. There is  no distension.      Palpations: There is hepatomegaly and splenomegaly (edge palpable).      Tenderness: There is no abdominal tenderness. There is no guarding or rebound.   Skin:     Comments: Spider hemangiomas noted   Neurological:      Mental Status: She is alert and oriented to person, place, and time.   Psychiatric:         Attention and Perception: Attention normal.         Mood and Affect: Affect is labile.         Speech: Speech normal.         Behavior: Behavior is agitated. Behavior is not aggressive.         Thought Content: Thought content is not delusional. Thought content does not include homicidal or suicidal ideation. Thought content does not include homicidal or suicidal plan.       UA neg  Assessment:     1. Alcoholic cirrhosis of liver with ascites     2. Alcoholism     3. Abnormal urinalysis  POCT UA Dipstix (10)(Multi-Test)    Urine culture         Plan:     34 y.o. female presents for alcoholism causing cirrhosis, neuropathy, and familial tension. She has underlying mood  disorder and has lack of insight vs denial of her ongoing medical problems, alcoholism and the tension it is causing in her family.   -counseled at length on underlying diagnosis and options for treatment  -she was initially resistant, but she voiced agreement to calling [facility] intake line and having assessment to discuss options for treatment.    -encouraged follow up with GI  -reports plan to see hematology for ongoing anemia  -counseled on alcohol use and it's contributing factor for her family dynamics and potential eviction as well as worsening her neuropathy and cirrhosis  -consider social work involvement  -counseled mom that she can call the police if at any time she felt she was a threat of harm to herself or others   -counseled on signs/symptoms to warrant re-evaluation     Return in about 2 weeks (around 11/01/2019) for follow up current symptoms.     Gust Brooms, MD      The following activities were performed on the date of service:  preparing to see the patient: - chart review   - review of prior labs  - review of prior ED visits  obtaining and/or reviewing the separately obtained history  performing a medically appropriate examination and/or evaluation   counseling and educating the patient/family/caregiver  referring and communicating with other health care professionals (when not separately reported)   documenting clinical information in the electronic or other health records    Total time spent performing activities on date of service:  49  minutes

## 2019-10-18 NOTE — Progress Notes (Signed)
Have you seen any specialists/other providers since your last visit with [place]?    Yes  GI  ER    Arm preference verified?   Yes    The patient is due for pap smear, pneumonia vaccine and [facility] care plan

## 2019-10-18 NOTE — Patient Instructions (Signed)
Understanding the Disease of Addiction  What is addiction?  Addiction is a long-lasting (chronic) disease of the brain. Addiction to substances such as drugs and alcohol may be called substance use disorder. It affects how your brain learns and works. Your genes and your environment can affect your risk for addiction. A family history of addiction also raises your risk. But anyone can have an addiction. Unfortunately, many people falsely think that addiction is a moral weakness. They think that people addicted to drugs or alcohol are just behaving badly or making poor choices.   How does addiction affect my brain?  Whether you start using drugs or alcohol is your choice. But once your brain is exposed to the addictive substance, your brain begins to change. This is especially true if you are more at risk for addiction. These brain changes overpower your self-control. This happens because the substance overexcites the brains reward center. The substance mimics the brain's own natural feel-good chemicals. The brain is rewired into believing that the substance is a good thing and that you need it to survive. This rewiring is very strong. Over time, you no longer find pleasure in other things you once enjoyed. The addiction is more powerful.   If you keep using the substance, your brain makes less of its own feel-good chemicals. You then must keep using drugs or alcohol to try to make up for the low levels of the brain chemicals. Over time your brain needs more and more of the drug or alcohol to achieve this. You need the drug. You no longer think about the physical, emotional, and social harm it causes.   Can you become addicted to things other than drugs or alcohol?  Addiction can happen in response to other pleasurable things that stimulate the brains reward center. These things include eating, having sex, gambling, using tobacco, and using the internet.   Can you get control over a brain disease?  The only way  to get over an addiction is to stop using the substance. Not using it lets your brain recover and go back to its normal functioning. You can relearn how to find pleasure in other things again. But your brain will always be at risk for addiction. Addiction is very powerful. So you often will need ongoing comprehensive medical and psychotherapy help and social support for long-term success.   Addiction is a chronic condition. Its common for people who are recovering from addiction to start using the substance again (called a relapse). This doesnt mean that treatment doesnt work. Just like other chronic health conditions, addiction requires ongoing treatment that changes as the persons needs change.    StayWell last reviewed this educational content on 03/24/2018   2000-2021 [employer], [place]. All rights reserved. This information is not intended as a substitute for professional medical care. Always follow your healthcare professional's instructions.        Addiction: Your Treatment Options  No single treatment for addiction works for everyone. The treatment that's best for you can depend on many factors. For many people, treatment may be a combination of medicine, behavior change, therapy, lifestyle changes, and support.  Medicines  Medicines can help with withdrawal symptoms. They can also reduce cravings for the addictive substance. They can blunt its feel-good effects. For example:   Methadone, buprenorphine, and naltrexone are used for heroin and other opioid addiction.   Acamprosate, disulfiram, and naltrexone are used for treating alcohol addiction.   Bupropion, varenicline, or nicotine replacement therapy  can help with nicotine addiction.  These medicines have proved to be quite helpful for people trying to overcome an addiction.    Behavior change treatment   Motivational Interviewing. This is a type of counseling that encourages you to change your behavior. The goal is to explore and resolve  any mixed feelings you have about quitting drug or alcohol use. The therapist helps you figure out and focus on your personal reasons for wanting to change.    Cognitive behavioral therapy (CBT). In this therapy, you figure out your problem behaviors. And you learn ways to change those behaviors. For example, if anger or stress makes you want to drink, a therapist can help you learn healthy ways to manage those feelings.   Community reinforcement approach (CRA). This therapy uses vouchers help you follow a drug- or alcohol-free lifestyle. With each clean urine sample, you get a voucher to use for a reward. This helps you stay drug- or alcohol-free while you learn new life skills.   Community reinforcement and family training (CRAFT). This therapy counsels and trains your family. The therapist teaches them how to motivate you to seek or continue treatment. This therapy also helps your family recognize family situations that may encourage you to drink or use drugs.   Mutual support groups. These groups are run voluntarily by non-health careprofessional people.Their purpose is to support each other emotionally and socially by sharing their experiences with substance abuse and mentoring others through the recovery process. Many of these groups are based on the 12-step recovery model, and have sessions available for every type of addiction(for example, AA or Alcoholics Anonymous; NA or Narcotics Anonymous).   Individualized drug counseling (IDC). This commonly practiced form of therapy typically incorporate the disease model of addiction and the spiritual dimension of recovery while also focusing on behavioral change through participation in 12-step programs.  StayWell last reviewed this educational content on 04/25/2015   2000-2021 [employer], [place]. All rights reserved. This information is not intended as a substitute for professional medical care. Always follow your healthcare professional's  instructions.

## 2019-10-20 LAB — URINE CULTURE

## 2019-10-20 MED ORDER — CEPHALEXIN 500 MG PO CAPS
500.0000 mg | ORAL_CAPSULE | Freq: Two times a day (BID) | ORAL | 0 refills | Status: AC
Start: 2019-10-20 — End: 2019-10-25

## 2019-10-20 NOTE — Addendum Note (Signed)
Addended by: Mariann Laster. on: 10/20/2019 04:01 PM     Modules accepted: Orders

## 2019-10-24 ENCOUNTER — Ambulatory Visit (HOSPITAL_BASED_OUTPATIENT_CLINIC_OR_DEPARTMENT_OTHER): Payer: Medicaid Other | Admitting: Neurology

## 2019-10-24 ENCOUNTER — Telehealth: Payer: Self-pay | Admitting: Internal Medicine

## 2019-10-24 NOTE — Telephone Encounter (Signed)
Pt mom pam called - wanted to relay msg to PCP that pt is in rehab facility    States they discussed this at recent appt    FYI

## 2019-10-25 NOTE — Telephone Encounter (Signed)
Great news. Follow up after she is discharged.

## 2019-10-26 ENCOUNTER — Encounter (INDEPENDENT_AMBULATORY_CARE_PROVIDER_SITE_OTHER): Payer: Self-pay | Admitting: Internal Medicine

## 2019-10-26 DIAGNOSIS — E785 Hyperlipidemia, unspecified: Secondary | ICD-10-CM

## 2019-11-09 DIAGNOSIS — U071 COVID-19: Secondary | ICD-10-CM

## 2019-11-09 HISTORY — DX: COVID-19: U07.1

## 2019-11-22 ENCOUNTER — Telehealth (INDEPENDENT_AMBULATORY_CARE_PROVIDER_SITE_OTHER): Payer: Medicaid Other | Admitting: Family Medicine

## 2019-11-22 ENCOUNTER — Telehealth (INDEPENDENT_AMBULATORY_CARE_PROVIDER_SITE_OTHER): Payer: Medicaid Other

## 2019-11-22 ENCOUNTER — Encounter (INDEPENDENT_AMBULATORY_CARE_PROVIDER_SITE_OTHER): Payer: Self-pay | Admitting: Family Medicine

## 2019-11-22 ENCOUNTER — Telehealth (INDEPENDENT_AMBULATORY_CARE_PROVIDER_SITE_OTHER): Payer: Medicaid Other | Admitting: Family Nurse Practitioner

## 2019-11-22 DIAGNOSIS — K7031 Alcoholic cirrhosis of liver with ascites: Secondary | ICD-10-CM

## 2019-11-22 DIAGNOSIS — U071 COVID-19: Secondary | ICD-10-CM

## 2019-11-22 NOTE — Progress Notes (Signed)
Date of Virtual Visit: 11/22/2019 4:17 PM        Patient ID: Erin Huber is a 34 y.o. female.  Attending Physician: Talmage Nap, DO    Verbal consent has been obtained from the patient to conduct a telemedicine via video visit to minimize exposure to COVID-19: Yes     Telemedicine Eligibility:      Do you give [place] permission to submit this claim to your insurance:    [x]  Yes   []  No    State Location:  [x]  [place]  []  [place]  []  [place] []  [place]    []  Other (specify):    Patient Identity Verification:  []  State Issued ID  [x]  DOB / photo ID  []  Other (specify):             Chief Complaint:    Other (follow up 11/09/2019 positive covid, 11/21/2019 positive test)               HPI:    Due to current pandemic of COVID 19, patient being seen through a virtual visit to minimize infectious disease risk to themselves and our medical office staff.    Patient shares she was diagnosed with COVID-19 on 18 August and retested positive yesterday.  Patient is fully vaccinated.  States she is feeling well.  She is concerned given possible complications in the setting of cirrhosis.  Would like to discuss monoclonal antibody infusion.            Problem List:    Patient Active Problem List   Diagnosis    Abnormal clinical finding    Alcohol dependence with alcohol-induced sleep disorder    Anxiety    Dizziness    Chronic gout without tophus, unspecified cause, unspecified site    Hyperlipidemia, unspecified hyperlipidemia type    Essential hypertension    Ascites due to alcoholic cirrhosis    Cirrhosis    Neuropathy    Moderate episode of recurrent major depressive disorder    Thrombocytosis             Current Meds:    Current Outpatient Medications   Medication Sig Dispense Refill    furosemide (LASIX) 20 MG tablet Take 20 mg by mouth daily      gabapentin (NEURONTIN) 600 MG tablet Take 1 tablet (600 mg total) by mouth 3 (three) times daily (Patient taking differently:  Take 600 mg by mouth 2 (two) times daily   ) 90 tablet 2    loratadine (CLARITIN) 10 MG tablet Take 10 mg by mouth daily      Multiple Vitamin (MULTIVITAMIN PO) Take 1 capsule by mouth daily Gummy version        rosuvastatin (CRESTOR) 10 MG tablet TAKE 1 TABLET BY MOUTH EVERY DAY 90 tablet 0    spironolactone (ALDACTONE) 50 MG tablet Take 50 mg by mouth daily         thiamine (B-1) 100 MG tablet Take 100 mg by mouth daily      vitamin D, ergocalciferol, (DRISDOL) 50000 UNIT Cap Take 1 capsule (50,000 Units total) by mouth once a week 10 capsule 1    Xifaxan 550 MG Tab 550 mg 2 (two) times daily      DULoxetine (CYMBALTA) 30 MG capsule TAKE 1 CAPSULE (30 MG TOTAL) BY MOUTH 2 (TWO) TIMES DAILY 180 capsule 1     No current facility-administered medications for this visit.  Allergies:    No Known Allergies          Past Surgical History:    Past Surgical History:   Procedure Laterality Date    WISDOM TOOTH EXTRACTION Bilateral 2008           Family History:    Family History   Problem Relation Age of Onset    Cancer Maternal Grandmother         breast ca    Drug abuse Sister            Social History:    Social History     Tobacco Use    Smoking status: Current Every Day Smoker     Packs/day: 0.50     Types: Cigarettes    Smokeless tobacco: Never Used   Vaping Use    Vaping Use: Never used   Substance Use Topics    Alcohol use: Not Currently     Comment: Drinks 8-10 shots of liquor, 1 bottle of wine and 3-6 hard llemonades daily    Drug use: No          The following sections were reviewed this encounter by the provider:   Tobacco   Allergies   Meds   Problems   Med Hx   Surg Hx   Fam Hx              Vital Signs:    There were no vitals taken for this visit.         ROS:    Review of Systems   Constitutional: Negative for chills, fatigue and fever.   Respiratory: Negative for shortness of breath.    Cardiovascular: Negative for chest pain.   Neurological: Negative for weakness.              Physical Exam:    Physical Exam   GENERAL APPEARANCE: alert, in no acute distress, pleasant, well nourished.   Normal respiratory effort  PSYCH: appropriate affect, appropriate mood, normal speech, normal attention        Assessment:    1. COVID-19    2. Alcoholic cirrhosis of liver with ascites            Plan:      Patient was diagnosed on 18 August with COVID-19.  To be tested positive yesterday.  Patient would like to discuss monoclonal antibody infusion given her history of cirrhosis  Discussed with patient monoclonal antibody infusion is usually administered first 10 days of diagnosis.  Patient would like to reach out to extended COVID-19 care clinic for further information regarding infusions.  Number given.  At this point no referral will be placed as she is outside the guideline window for treatment  Supportive care discussed  Focus on hydration  All questions answered  Precautions given of when seek medical care  Patient agrees with plan  Time spent:20 minutes        Follow-up:    Return if symptoms worsen or fail to improve.         Talmage Nap, DO       Note: This chart was generated by the EMR system/speech recognition and may contain inherent errors or omissions not intended by the user. Grammatical errors, random word insertions, deletions, pronoun errors and incomplete sentences are occasional consequences of this technology due to software limitations. Not all errors are caught or corrected. If there are questions or concerns about the content of this note or information contained within the  body of this dictation they should be addressed directly with the author for clarification.

## 2019-11-22 NOTE — Patient Instructions (Signed)
You have been referred to the [facility]. All patients must have a positive COVID-19 test prior scheduling their [facility] Clinic telemedicine assessment.  Please give us a call at  [phone] as soon as you have a confirmed positive COVID test.  The fastest way you can view your results is by signing up for MyChart at [email or web address].    During your telemedicine assessment, a provider will assess your condition and make further recommendations with regard to your care. The provider will:      . Assess to see if you are clinically eligible for monoclonal antibody therapy and arrange for an infusion if you qualify and consent to the treatment  . Arrange an in-person evaluation at one of our clinic sites or direct you to the emergency room if needed  . Continue your treatment until you are stable enough to return back into the care of your primary care physician.     If you were given an oxygen pulse oximeter or already have one at home, check your oxygen level at least twice a day while at rest as well as if you develop worsening or the onset of shortness of breath. Review those readings with your provider during your telemedicine visit. If your oxygen saturation remains higher than 90%, you can continue to self-monitor and discuss any changes to your care plan during your next follow up visit.  If your oxygen level drops below 90%, go to the emergency room as soon as possible.       If at any point you develop severe chest pain, severe shortness of breath, confusion or any other concerning symptom that warrants immediate evaluation, please go the nearest emergency room for an assessment.

## 2019-11-22 NOTE — Progress Notes (Signed)
Have you seen any specialists/other providers since your last visit with [place]?    Yes  Urgent Care     Arm preference verified?   No    The patient is due for [facility] Care Plan Letter, High Risk Pneumonia, Pap Smear, Influenza Vaccine

## 2019-11-24 ENCOUNTER — Telehealth (HOSPITAL_BASED_OUTPATIENT_CLINIC_OR_DEPARTMENT_OTHER): Payer: Self-pay

## 2019-11-24 NOTE — Telephone Encounter (Signed)
Received call from patient's mother who states patient is in a inpatient alcohol facility in [place], and facility will send fax request for records.

## 2019-11-24 NOTE — Telephone Encounter (Signed)
-----   Message from [facility] sent at 11/24/2019  8:37 AM EDT -----  Regarding: Dr. Bearl Mulberry: [phone]  General Message/Vendor Calls    Caller's first and last name:Pam Kary Kos, mother      Reason for call:Pt would like for Dr. Hinton Lovely staff to fax a medical records request to [facility], Dr. Chales Abrahams at fax number: [phone]      Callback required yes/no and why:Yes, confirm.       Best contact number(s):[phone]      Details to clarify the request:n/a      Karene Fry

## 2019-11-24 NOTE — Telephone Encounter (Signed)
Record request faxed to [facility] as requested by pt

## 2019-11-25 NOTE — Telephone Encounter (Signed)
-----   Message from Reed Pandy sent at 11/25/2019  8:51 AM EDT -----  Regarding: Dr.Floranda/telephone  General Message/Vendor Calls    Caller's first and last name: Wendie Chess (pt mother)      Reason for call: fax med records      Callback required yes/no and why: yes, to clarify      Best contact number(s): [phone] mother's cell      Details to clarify the request:Caller spoke with front desk that stated she has to send a request for medical records. Caller wants request for records sent to fax#: [phone]      Reed Pandy

## 2019-11-30 NOTE — Telephone Encounter (Signed)
Left message for pt's mother asking for call back regarding request for medical records.

## 2019-12-01 ENCOUNTER — Encounter: Attending: Neurology

## 2019-12-05 ENCOUNTER — Ambulatory Visit: Attending: Neurology

## 2019-12-05 ENCOUNTER — Ambulatory Visit: Admit: 2019-12-05 | Payer: MEDICAID | Attending: Neurology

## 2019-12-05 DIAGNOSIS — R4189 Other symptoms and signs involving cognitive functions and awareness: Secondary | ICD-10-CM

## 2019-12-05 NOTE — Progress Notes (Signed)
NEUROLOGY NEW PATIENT OFFICE CONSULTATION      12/05/2019    RE: Michelle Singleton         1985-09-25      REFERRED BY:  None        CHIEF COMPLAINT:  This is Michelle Singleton is a 34 y.o. female  who had concerns including Other (Worsening memory issues, dizziness, neuropathy, "fullness" in the head, history of alcohol abuse and cirrhosis of the liver).    HPI:       Patient comes in with cognitive issues . Diagnosed with hepatic encephalopathy on Rifaximin.    Was seeing a neurologist at [facility],  Dr Chales Abrahams for bilateral lower extremity paresthesias, weakness and difficulty walking and balancing on Gabapentin 600 mg TID. Cymbalta 30 mg BID was added. Was suppose to do physical therapy and THiamine 100 mg every day     (+) alcohol abuse since teenage yrs with liver cirrhosis (Drinks 8-10 shots of liquor, 1 bottle of wine and 3-6 hard llemonades daily)    Patient is an alcohol program (has no alcohol for 40 days).     Work up in the past:  MRI brain: There are small FLAIR hyperintensities in the white matter  of the cerebral hemispheres which are nonspecific. Diagnostic  possibilities include but are not limited to the sequela of migraine  headaches, demyelination, Lyme disease, vasculitis or a prior  infectious/inflammatory process.    Spinal tap negative for elevated proteins    MRI of the lumbar and thoracic spine:  1. The thoracic spinal cord is normal in caliber and signal intensity,  and the conus medullaris terminates at a normal level.  2. There is no significant canal stenosis in the thoracolumbar spine.  3. 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     Blood tests:  Ammonia 21  Utox: (+) Cannabis  Vit B12 1070  TSH WNL  Vit B1 127  AST and ALT elevated      ROS   (-) fever  (-) rash  All other systems reviewed and are negative    Past Medical Hx  No past medical history on file.   Anxiety    Social Hx  Social History     Socioeconomic History   ??? Marital status: UNKNOWN     Spouse name: Not on  file   ??? Number of children: Not on file   ??? Years of education: Not on file   ??? Highest education level: Not on file     Social Determinants of Health     Financial Resource Strain:    ??? Difficulty of Paying Living Expenses:    Food Insecurity:    ??? Worried About Programme researcher, broadcasting/film/video in the Last Year:    ??? Ran Out of Food in the Last Year:    Transportation Needs:    ??? Freight forwarder (Medical):    ??? Lack of Transportation (Non-Medical):    Physical Activity:    ??? Days of Exercise per Week:    ??? Minutes of Exercise per Session:    Stress:    ??? Feeling of Stress :    Social Connections:    ??? Frequency of Communication with Friends and Family:    ??? Frequency of Social Gatherings with Friends and Family:    ??? Attends Religious Services:    ??? Database administrator or Organizations:    ??? Attends Banker Meetings:    ???  Marital Status:        Family Hx  No family history on file.    ALLERGIES  Not on File    CURRENT MEDS  Current Outpatient Medications   Medication Sig Dispense Refill   ??? furosemide (LASIX) 40 mg tablet Take 40 mg by mouth daily.     ??? gabapentin (NEURONTIN) 600 mg tablet Take 500 mg by mouth two (2) times a day.     ??? loratadine (CLARITIN) 10 mg tablet Take 10 mg by mouth.     ??? rifAXIMin (XIFAXAN) 200 mg tablet Take 550 mg by mouth two (2) times a day.     ??? spironolactone (ALDACTONE) 50 mg tablet Take 50 mg by mouth daily.     ??? thiamine HCL (B-1) 100 mg tablet Take 100 mg by mouth daily.             PREVIOUS WORKUP: (reviewed)  IMAGING:    CT Results (recent):  No results found for this or any previous visit.      MRI Results (recent):  No results found for this or any previous visit.      IR Results (recent):  No results found for this or any previous visit.      VAS/US Results (recent):  No results found for this or any previous visit.          LABS (reviewed)  No results found for this or any previous visit.    Physical Exam:     Visit Vitals  BP 108/72 (BP 1 Location: Left arm,  BP Patient Position: Sitting, BP Cuff Size: Adult)   Pulse 95   Temp 98.5 ??F (36.9 ??C) (Temporal)   Wt 54.5 kg (120 lb 3.2 oz)   SpO2 100%     General:  Alert, cooperative, no distress.   Head:  Normocephalic, without obvious abnormality, atraumatic.   Eyes:  Conjunctivae/corneas clear.   Lungs:  Heart:   Non labored breathing  Regular rate and rhythm, no carotid bruits   Abdomen:   Soft, non-distended   Extremities: Extremities normal, atraumatic, no cyanosis or edema.   Pulses: 2+ and symmetric all extremities.   Skin: Skin color, texture, turgor normal. No rashes or lesions.  Neurologic Exam     Gen: Attention normal             Language: naming, repetition, fluency normal             Memory: intact recent and remote memory  Cranial Nerves:  I: smell Not tested   II: visual fields Full to confrontation   II: pupils Equal, round, reactive to light   II: optic disc No papilledema   III,VII: ptosis none   III,IV,VI: extraocular muscles  Full ROM   V: mastication normal   V: facial light touch sensation  normal   VII: facial muscle function   symmetric   VIII: hearing symmetric   IX: soft palate elevation  normal   XI: trapezius strength  5/5   XI: sternocleidomastoid strength 5/5   XI: neck flexion strength  5/5   XII: tongue  midline     Motor: normal bulk and tone, no tremor              Strength: 5/5 all four extremities  Sensory: intact to LT, PP, vibration, and JPS  Reflexes: 2+ throughout; Down going toes  Coordination: Good FTN and HTS  Gait: normal gait including tandem  Impression:     Michelle Singleton is a 34 y.o. female who has  Anxiety and alcohol abuse since teenage yrs with liver cirrhosis (Drinks 8-10 shots of liquor, 1 bottle of wine and 3-6 hard llemonades daily) who comes in with cognitive issues. MMSE is 29/30. Consideration includes mild cognitive impairment due to history of alcohol abuse and current hepatic encephalopathy.    Patient also has bilateral lower extremity paresthesias,  weakness and difficulty walking and balancing. Was seeing a neurologist at [facility] Dr Chales Abrahams and was placed on Gabapentin 500 mg BID and  Cymbalta 30 mg BID was added. Also on Thiamine 100 mg every day. 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    Lastly, patient is complaining of fullness of ears and balance issues concerning for an inner ear pathology.       RECOMMENDATIONS  1.  I had a long discussion with patient and mother. Discussed diagnosis, prognosis, pathophysiology and available treatment. Reviewed test results. All questions were answered.  2. Reviewed medical records from [facility]where  3. Patient already quit drinking and has been sober for 40 days c/o alcohol program  4. Discussed MMSE is 29/30 and she just needs more time in terms of cognitive recovery  5. Already on Thiamine 100 mg every day   6. Follow up with GI regarding hepatic encephalopathy (also wants to see a local GI)  7. Already on Cymbalta which can help with anxiety (although not sure if she is taking it)  8. Advise to see an ENT for fullness of ears and balance issues    Follow-up and Dispositions    ?? Return if symptoms worsen or fail to improve.            Thank you for the consultation      Melford Aase, MD  Diplomate, American Board of Psychiatry and Neurology  Diplomate, Neuromuscular Medicine  Diplomate, American Board of Electrodiagnostic Medicine        CC: None  Fax: None

## 2019-12-05 NOTE — Progress Notes (Signed)
 Chief Complaint   Patient presents with   . Other     Worsening memory issues, dizziness, neuropathy, fullness in the head, history of alcohol abuse and cirrhosis of the liver     Visit Vitals  BP 108/72 (BP 1 Location: Left arm, BP Patient Position: Sitting, BP Cuff Size: Adult)   Pulse 95   Temp 98.5 F (36.9 C) (Temporal)   Wt 54.5 kg (120 lb 3.2 oz)   SpO2 100%

## 2019-12-05 NOTE — Telephone Encounter (Signed)
-----   Message from Tina Griffiths sent at 12/05/2019  1:41 PM EDT -----  Regarding: Dr. Chaney Born  General Message/Vendor Calls    Caller's first and last name: Self       Reason for call: Referral Needed       Callback required yes/no and why: Yes referral needed      Best contact number(s): [phone]      Details to clarify the request: The patient needs a referral sent over to the hematologist and for the GI doctor.       Tina Griffiths

## 2019-12-07 ENCOUNTER — Encounter (INDEPENDENT_AMBULATORY_CARE_PROVIDER_SITE_OTHER): Payer: Self-pay | Admitting: Internal Medicine

## 2019-12-07 NOTE — Telephone Encounter (Signed)
Contacted pt regarding message about referrals. Informed pt that per Dr. Esmond Plants referrals will need to come from PCP. Pt stated understanding.

## 2019-12-23 ENCOUNTER — Encounter (INDEPENDENT_AMBULATORY_CARE_PROVIDER_SITE_OTHER): Payer: Self-pay | Admitting: Internal Medicine

## 2019-12-30 ENCOUNTER — Encounter (INDEPENDENT_AMBULATORY_CARE_PROVIDER_SITE_OTHER): Payer: Self-pay

## 2020-01-06 NOTE — Telephone Encounter (Signed)
-----   Message from April Rabb sent at 01/06/2020  8:52 AM EDT -----  Regarding: Dr.Floranda/Telephone  General Message/Vendor Calls    Caller's first and last name: Holley Rude       Reason for call: Pt needs a refill on gabapenton  that her previous neuro Dr prescribed and wants to if Dr.Floranda can write her a new prescription       Callback required yes/no and why: Yes       Best contact number(s): [phone]      Details to clarify the request: Pt needs a refill on gabapenton  that her previous neuro Dr prescribed and wants to if Dr.Floranda can write her a new prescription         April Rabb

## 2020-01-09 NOTE — Telephone Encounter (Signed)
Attempted to call pt back after her missed call with no answer.

## 2020-01-09 NOTE — Telephone Encounter (Signed)
Attempted to contact pt regarding medication request. Pt did not answer phone and does not have voicemail set up.

## 2020-01-09 NOTE — Telephone Encounter (Signed)
-----   Message from Tina Griffiths sent at 01/09/2020  1:54 PM EDT -----  Regarding: Dr. Chaney Born  Patient return call    Caller's first and last name and relationship (if not the patient):      Best contact number(s): [phone]      Whose call is being returned: Michelle Singleton      Details to clarify the request: Patient is returning call Regarding med request.       Tina Griffiths

## 2020-01-18 NOTE — Telephone Encounter (Signed)
-----   Message from Jamee Fila sent at 01/18/2020  2:23 PM EDT -----  Regarding: Dr.Floranda/Telephone  General Message/Vendor Calls         Caller's first and last name: Holley Rusk (Mother)               Reason for call: Pt needs a refill on gabapenton  that her previous neuro Dr prescribed and wants to if Dr.Floranda can write her a new prescription (PT HAS CALLED SEVERAL TIMES) spoke W/ female and gave over mothers phone number because pt does not have a phone rn               Callback required yes/no and why: Yes               Best contact number(s): [phone]              Details to clarify the request  Pt needs a refill on gabapenton  that her previous neuro Dr prescribed and wants to if Dr.Floranda can write her a new prescription (PT HAS CALLED SEVERAL TIMES) spoke W/ female and gave over mothers phone number because pt does not have a phone rn                   [place]

## 2020-01-18 NOTE — Telephone Encounter (Signed)
-----   Message from [facility] sent at 01/18/2020  2:07 PM EDT -----  Regarding: Dr. Esmond Plants telephone  Contact: [phone]     Medication Refill    Caller (if not patient): Kary Kos , Pam      Relationship of caller (if not patient): Mother       Best contact number(s): [phone]      Name of medication and dosage if known: Gabapentin 600 mg      Is patient out of this medication (yes/no): yes      Pharmacy name: [facility]     Pharmacy listed in chart? (yes/no):yes  Pharmacy phone number: [phone]      Details to clarify the request: n/a      [facility]

## 2020-01-20 ENCOUNTER — Telehealth (HOSPITAL_BASED_OUTPATIENT_CLINIC_OR_DEPARTMENT_OTHER): Payer: Self-pay

## 2020-01-20 ENCOUNTER — Other Ambulatory Visit (INDEPENDENT_AMBULATORY_CARE_PROVIDER_SITE_OTHER): Payer: Self-pay | Admitting: Internal Medicine

## 2020-01-20 ENCOUNTER — Other Ambulatory Visit (HOSPITAL_BASED_OUTPATIENT_CLINIC_OR_DEPARTMENT_OTHER): Payer: Self-pay

## 2020-01-20 DIAGNOSIS — R29898 Other symptoms and signs involving the musculoskeletal system: Secondary | ICD-10-CM

## 2020-01-20 MED ORDER — GABAPENTIN 600 MG PO TABS
600.0000 mg | ORAL_TABLET | Freq: Two times a day (BID) | ORAL | 0 refills | Status: AC
Start: 2020-01-20 — End: ?

## 2020-01-20 NOTE — Telephone Encounter (Signed)
Contacted pt's mother who is the original caller. Informed pt's mother that Dr. Esmond Plants is unable to refill Gabapentin due to not being original prescriber.

## 2020-01-20 NOTE — Telephone Encounter (Signed)
Received call from mother requesting refill for Gabapentin states patient is in a rehab facility in [place].  Refill has been requested.  Mother instructed to notify patient that she needs to schedule a  follow up appointment within the next 30 days with Provider or NP, mother states she will notify patient.  Information provided verbally to A Lomaglio NP

## 2020-01-20 NOTE — Telephone Encounter (Signed)
Sent to NP 

## 2020-01-20 NOTE — Telephone Encounter (Addendum)
Received request for gabapentin refill from [facility] with comment "Patient requests refill".  Pt has not been seen since 09/14/2019. Was supposed to return in 6 weeks. Last note says patient will start duloxetine 30 mg twice daily and decrease gabapentin to 600 mg BID. There are no notes since June to the contrary.  Pt is currently in a rehab-type facility.  Unsure of current dose patient is taking.  Sent gabapentin 600 mg twice daily (qty 60, refills 0), request pt make appointment for future refills.

## 2020-02-09 ENCOUNTER — Telehealth (HOSPITAL_BASED_OUTPATIENT_CLINIC_OR_DEPARTMENT_OTHER): Payer: Medicaid Other | Admitting: Family Nurse Practitioner

## 2020-02-09 ENCOUNTER — Ambulatory Visit (HOSPITAL_BASED_OUTPATIENT_CLINIC_OR_DEPARTMENT_OTHER): Payer: Medicaid Other | Admitting: Family Nurse Practitioner

## 2020-02-14 ENCOUNTER — Telehealth (HOSPITAL_BASED_OUTPATIENT_CLINIC_OR_DEPARTMENT_OTHER): Payer: Medicaid Other | Admitting: Family Nurse Practitioner

## 2020-02-22 NOTE — Telephone Encounter (Signed)
Patient is currently in a recovery program and she was told she couldn't have Gabapentin but can have Lyrica. Patient's mother calling stating the patient is in a lot of pain and she is wondering if Dr. Esmond Plants can prescribe Lyrica. Please call

## 2020-02-22 NOTE — Progress Notes (Deleted)
Subjective:      Patient ID: Erin Huber is a 34 y.o. female.    HPI  The patient is a 34 y.o., female, who presents via video teleconference for follow-up of bilateral leg weakness.  Verbal consent for video telemedicine was obtained.     Patient was last seen on 09/14/2019 by Dr. Chales Abrahams.    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   She is taking gabapentin 600 mg twice daily.    From previous note:  The patient is a pleasant 34 years old female who is is here for bilateral lower extremity paresthesias, weakness and difficulty walking and balancing.  The dose of gabapentin was tapered up to 600 mg 3 times a day, it has not helped much.,  She continues to have tingling numbness and burning sensation with loss of balance.  She is unable to sleep better.  She reports that she has not had any alcohol since February of this year.  She continues to have the symptoms, had further work-up which showed low vitamin D.  She also had the MRI which is reported as normal.  She also had upper GI endoscopy and was found to have ulcers, being followed by gastroenterology.  Her symptoms are felt to be secondary to heavy alcohol abuse which she has been doing up until a month ago. As per the mother, she was recently admitted in the hospital for cirrhosis, she reports that she has stopped taking alcohol since last 1 month. She feels her symptoms have worsened since the last visit.  She had extensive work-up including spinal tap, nerve conduction studies as well as MRI of the lumbar and thoracic spine.  The symptoms have been persistent, she reports having this sharp stabbing pain which are intermittent, not responding to gabapentin 600 mg 3 times a day.  She was also seen by neurosurgery and it was felt the symptoms could be related to neuropathy rather than a annular tear noted in the MRI lumbar spine.  As per the mother, she continues to drink though not heavily as she used to.  She is  considering to cut down with time.     {Common ambulatory SmartLinks:19316}    Review of Systems   As above.  Current Outpatient Medications on File Prior to Visit   Medication Sig Dispense Refill    furosemide (LASIX) 20 MG tablet Take 20 mg by mouth daily      gabapentin (NEURONTIN) 600 MG tablet Take 1 tablet (600 mg total) by mouth 2 (two) times daily **NEEDS APPOINTMENT FOR FUTURE REFILLS** 60 tablet 0    loratadine (CLARITIN) 10 MG tablet Take 10 mg by mouth daily      Multiple Vitamin (MULTIVITAMIN PO) Take 1 capsule by mouth daily Gummy version        rosuvastatin (CRESTOR) 10 MG tablet TAKE 1 TABLET BY MOUTH EVERY DAY 90 tablet 0    spironolactone (ALDACTONE) 50 MG tablet Take 50 mg by mouth daily         thiamine (B-1) 100 MG tablet Take 100 mg by mouth daily      vitamin D, ergocalciferol, (DRISDOL) 50000 UNIT Cap Take 1 capsule (50,000 Units total) by mouth once a week 10 capsule 1    Xifaxan 550 MG Tab 550 mg 2 (two) times daily       No current facility-administered medications on file prior to visit.       Objective:   There were no  vitals filed for this visit.  There is no height or weight on file to calculate BMI.    Mental Status: The patient is awake, alert, and oriented. [name]   is appropriate. Speech is fluent, and the patient follows commands consistently.  Cranial Nerves: Pupils are equal, round, and reactive to light. Extraocular movements are intact, without nystagmus. The patients face is symmetric. Tongue protrudes midline. Shoulder shrug symmetrically intact.   Motor Exam: The patient moves all four extremities independently against gravity. Tone and bulk appear normal. There are no abnormal movements or tremor noted.   Sensation: Light touch appears intact.   Coordination: Finger-to-nose-to-finger testing is intact without dysmetria. There is no Romberg sign.   Station and Gait: Normal, steady gait. Able to march in place.      Data Review:    Results for orders placed  or performed in visit on 06/16/19   MRI Brain WO Contrast    Narrative    HISTORY: Cognitive impairment. Leg weakness.    COMPARISON: A head CT 10/07/2018     TECHNIQUE: Noncontrast MR imaging of the brain was performed on 1.5  Tesla magnet.    FINDINGS: A few small FLAIR hyperintensities are seen in the white  matter of the cerebral hemispheres. There is no diffusion restriction,  mass effect, midline shift, hydrocephalus or cerebellar tonsillar  ectopia. Flow voids of the major vessels are maintained. 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 The mastoid and  paranasal sinus regions are clear. There is rightward nasal septal  deviation and spurring.      Impression     There are small FLAIR hyperintensities in the white matter  of the cerebral hemispheres which are nonspecific. Diagnostic  possibilities include but are not limited to the sequela of migraine  headaches, demyelination, Lyme disease, vasculitis or a prior  infectious/inflammatory process.    Otho Ket, DO   06/29/2019 3:46 PM   Results for orders placed or performed during the hospital encounter of 10/07/18   CT Head WO Contrast    Narrative    HISTORY: Pain after fall.    Technique: Noncontrast CT of the head. The following dose reduction  techniques were utilized: automatic exposure control and/or adjustment  of mA and/or kV according to patient size, and the use of iterative  reconstruction technique.    Comparison: None.    Findings: There is no evidence of acute territorial infarction or  intracranial hemorrhage. The ventricles and sulci are normal in size and  configuration. There is no mass effect or midline shift. No extra-axial  collections are seen. The globes and orbits appear unremarkable. The  visualized paranasal sinuses and mastoid air cells are clear.      Impression     No acute intracranial abnormality.    Georgann Housekeeper, MD   10/07/2018 1:05 PM     Assessment:     No diagnosis found.    Plan:       The  patient should return for follow-up in *** or earlier, if needed.  Time spent in video telemedicine with patient: minutes    Darrick Grinder, MSN, [facility]  [facility] - [place]  [phone]    Dr. Chales Abrahams was available in a supervisory capacity.      This note was generated by the [facility] speech recognition program and may contain inherent errors or omissions not intended by the user. Grammatical errors, random word insertions, deletions, pronoun errors and  incomplete sentences are occasional consequences of this technology due to software limitations. Not all errors are caught or corrected. If there are questions or concerns about the content of this note or information contained within the body of this dictation they should be addressed directly with the author for clarification.

## 2020-02-23 ENCOUNTER — Encounter (HOSPITAL_BASED_OUTPATIENT_CLINIC_OR_DEPARTMENT_OTHER): Payer: Medicaid Other | Admitting: Family Nurse Practitioner

## 2020-02-23 ENCOUNTER — Encounter (HOSPITAL_BASED_OUTPATIENT_CLINIC_OR_DEPARTMENT_OTHER): Payer: Self-pay | Admitting: Family Nurse Practitioner

## 2020-02-24 ENCOUNTER — Telehealth

## 2020-02-24 MED ORDER — PREGABALIN 25 MG CAP
25 mg | ORAL_CAPSULE | Freq: Two times a day (BID) | ORAL | 3 refills | Status: DC
Start: 2020-02-24 — End: 2020-05-29

## 2020-02-24 NOTE — Telephone Encounter (Signed)
Spoke with pt's mother who stated pt is now in another facility and will call back with what pharmacy to send Lyrica to

## 2020-02-24 NOTE — Telephone Encounter (Signed)
Patient's mother, Elita Quick, returning phone call. Please call back

## 2020-02-24 NOTE — Telephone Encounter (Signed)
Spoke with pt's mother who requested Lyrica 25mg  be sent to [facility] on [address] as it is closest to facility pt is in at the moment

## 2020-02-24 NOTE — Telephone Encounter (Signed)
Left message informing that prescription has been sent, and advised that pharmacy may not allow pick up if not on pt's HIPPA form.

## 2020-03-09 ENCOUNTER — Inpatient Hospital Stay
Admit: 2020-03-09 | Discharge: 2020-03-10 | Disposition: A | Discharge status: Home / Self Care | Payer: MEDICAID | Attending: Emergency Medicine

## 2020-03-09 ENCOUNTER — Emergency Department: Admit: 2020-03-09 | Payer: MEDICAID

## 2020-03-09 DIAGNOSIS — K7460 Unspecified cirrhosis of liver: Secondary | ICD-10-CM

## 2020-03-09 LAB — URINALYSIS W/ REFLEX CULTURE
BACTERIA, URINE: NEGATIVE /hpf
Bacteria: NEGATIVE /hpf
Bilirubin, Urine: NEGATIVE
Bilirubin: NEGATIVE
Blood, Urine: NEGATIVE
Blood: NEGATIVE
Glucose, Ur: NEGATIVE mg/dL
Glucose: NEGATIVE mg/dL
Ketone: NEGATIVE mg/dL
Ketones, Urine: NEGATIVE mg/dL
Nitrite, Urine: NEGATIVE
Nitrites: NEGATIVE
Protein, UA: NEGATIVE mg/dL
Protein: NEGATIVE mg/dL
Specific Gravity, UA: 1.015 (ref 1.003–1.030)
Specific gravity: 1.015 (ref 1.003–1.030)
Urobilinogen, UA, POCT: 0.2 EU/dL (ref 0.2–1.0)
Urobilinogen: 0.2 EU/dL (ref 0.2–1.0)
pH (UA): 6.5 (ref 5.0–8.0)
pH, UA: 6.5 (ref 5.0–8.0)

## 2020-03-09 LAB — CBC WITH AUTOMATED DIFF
ABS. BASOPHILS: 0 10*3/uL (ref 0.0–0.1)
ABS. EOSINOPHILS: 0.1 10*3/uL (ref 0.0–0.4)
ABS. IMM. GRANS.: 0 10*3/uL (ref 0.00–0.04)
ABS. LYMPHOCYTES: 3.3 10*3/uL (ref 0.8–3.5)
ABS. MONOCYTES: 0.5 10*3/uL (ref 0.0–1.0)
ABS. NEUTROPHILS: 3.2 10*3/uL (ref 1.8–8.0)
ABSOLUTE NRBC: 0 10*3/uL (ref 0.00–0.01)
BASOPHILS: 1 % (ref 0–1)
EOSINOPHILS: 1 % (ref 0–7)
HCT: 35 % (ref 35.0–47.0)
HGB: 11.5 g/dL (ref 11.5–16.0)
IMMATURE GRANULOCYTES: 0 % (ref 0.0–0.5)
LYMPHOCYTES: 46 % (ref 12–49)
MCH: 31.1 PG (ref 26.0–34.0)
MCHC: 32.9 g/dL (ref 30.0–36.5)
MCV: 94.6 FL (ref 80.0–99.0)
MONOCYTES: 7 % (ref 5–13)
MPV: 11.6 FL (ref 8.9–12.9)
NEUTROPHILS: 45 % (ref 32–75)
NRBC: 0 PER 100 WBC
PLATELET: 160 10*3/uL (ref 150–400)
RBC: 3.7 M/uL — ABNORMAL LOW (ref 3.80–5.20)
RDW: 14.6 % — ABNORMAL HIGH (ref 11.5–14.5)
WBC: 7.2 10*3/uL (ref 3.6–11.0)

## 2020-03-09 LAB — HCG URINE, QL. - POC
HCG, Pregnancy, Urine, POC: NEGATIVE
Pregnancy test,urine (POC): NEGATIVE

## 2020-03-09 LAB — CBC WITH AUTO DIFFERENTIAL
Basophils %: 1 % (ref 0–1)
Basophils Absolute: 0 10*3/uL (ref 0.0–0.1)
Eosinophils %: 1 % (ref 0–7)
Eosinophils Absolute: 0.1 10*3/uL (ref 0.0–0.4)
Granulocyte Absolute Count: 0 10*3/uL (ref 0.00–0.04)
Hematocrit: 35 % (ref 35.0–47.0)
Hemoglobin: 11.5 g/dL (ref 11.5–16.0)
Immature Granulocytes: 0 % (ref 0.0–0.5)
Lymphocytes %: 46 % (ref 12–49)
Lymphocytes Absolute: 3.3 10*3/uL (ref 0.8–3.5)
MCH: 31.1 PG (ref 26.0–34.0)
MCHC: 32.9 g/dL (ref 30.0–36.5)
MCV: 94.6 FL (ref 80.0–99.0)
MPV: 11.6 FL (ref 8.9–12.9)
Monocytes %: 7 % (ref 5–13)
Monocytes Absolute: 0.5 10*3/uL (ref 0.0–1.0)
NRBC Absolute: 0 10*3/uL (ref 0.00–0.01)
Neutrophils %: 45 % (ref 32–75)
Neutrophils Absolute: 3.2 10*3/uL (ref 1.8–8.0)
Nucleated RBCs: 0 PER 100 WBC
Platelets: 160 10*3/uL (ref 150–400)
RBC: 3.7 M/uL — ABNORMAL LOW (ref 3.80–5.20)
RDW: 14.6 % — ABNORMAL HIGH (ref 11.5–14.5)
WBC: 7.2 10*3/uL (ref 3.6–11.0)

## 2020-03-09 MED ORDER — IOPAMIDOL 76 % IV SOLN
370 mg iodine /mL (76 %) | Freq: Once | INTRAVENOUS | Status: AC
Start: 2020-03-09 — End: 2020-03-09
  Administered 2020-03-10: via INTRAVENOUS

## 2020-03-09 MED FILL — ISOVUE-370  76 % INTRAVENOUS SOLUTION: 370 mg iodine /mL (76 %) | INTRAVENOUS | Qty: 100

## 2020-03-09 NOTE — ED Notes (Signed)
Pt c/o abdominal distention; pt c/o generalized abdominal pain, +nausea and head ache x  A few weeks the patient states [facility]. LBM today and normal. Pain for headache 7/10 (pt states she has been taken ibuprofen for her ear and head)

## 2020-03-09 NOTE — ED Notes (Signed)
Bedside and Verbal shift change report given to Yasmine W., RN (oncoming nurse) by Bridget F., RN (offgoing nurse). Report included the following information SBAR.

## 2020-03-09 NOTE — ED Provider Notes (Signed)
ED Provider Notes by Ellsworth LennoxNardella, Tierrah Anastos M, NP at 03/09/20 1946                Author: Ellsworth LennoxNardella, Armon Orvis M, NP  Service: Emergency Medicine  Author Type: Nurse Practitioner       Filed: 03/10/20 1554  Date of Service: 03/09/20 1946  Status: Attested           Editor: Ellsworth LennoxNardella, Zackrey Dyar M, NP (Nurse Practitioner)  Cosigner: Mellody DrownMiao, Julia, MD at 03/16/20 [phone]          Attestation signed by Mellody DrownMiao, Julia, MD at 03/16/20 0248          The patient was seen, evaluated, examined, treated including performance of procedures, and   [facility]. I was available for consultation in the ED, if needed, pursuant to the Code of [place]. The chart was received for cosigning after patient disposition.    Mellody DrownJulia Miao, MD                                    EMERGENCY DEPARTMENT HISTORY AND PHYSICAL EXAM      Date: 03/09/2020   Patient Name: Michelle Singleton        History of Presenting Illness          Chief Complaint       Patient presents with        ?  Abdominal Pain              History Provided By: Patient      Chief Complaint abdominal pain   Duration: 2 Weeks   Timing:  Acute   Location: generalized abdomen   Quality: "uncomfortabel sensation"   Severity: 4/10   Modifying Factors: none   Associated Symptoms: denies any other associated signs or symptoms         HPI: Michelle BaneHeather Lybrand is a  34 y.o. female with a PMH of cirrhosis  alcohol abuse who p pain for the past 2 weeks.  Patient endorses history of cirrhosis.  Patient states she knows she is not supposed to eat foods high in salt and is eating a lot of foods that do not  agree with her.  Patient states [facility] and has been sober since December 1.  Patient endorses history of alcoholism.      PCP: None        Current Outpatient Medications          Medication  Sig  Dispense  Refill           ?  famotidine (Pepcid) 20 mg tablet  Take 1 Tablet by mouth two (2) times a day for 10 days.  20 Tablet  0     ?  pregabalin (LYRICA) 25 mg  capsule  Take 1 Capsule by mouth two (2) times a day. Max Daily Amount: 50 mg.  60 Capsule  3     ?  furosemide (LASIX) 40 mg tablet  Take 40 mg by mouth daily.         ?  loratadine (CLARITIN) 10 mg tablet  Take 10 mg by mouth.         ?  rifAXIMin (XIFAXAN) 200 mg tablet  Take 550 mg by mouth two (2) times a day.         ?  spironolactone (ALDACTONE) 50 mg tablet  Take 50 mg by  mouth daily.               ?  thiamine HCL (B-1) 100 mg tablet  Take 100 mg by mouth daily.                 Past History        Past Medical History:     Past Medical History:        Diagnosis  Date         ?  Hypertension           ?  Liver disease             Past Surgical History:   History reviewed. No pertinent surgical history.      Family History:   History reviewed. No pertinent family history.      Social History:     Social History          Tobacco Use         ?  Smoking status:  Current Every Day Smoker              Packs/day:  0.50         ?  Smokeless tobacco:  Never Used       Substance Use Topics         ?  Alcohol use:  Yes             Comment: clean since dec. 3          ?  Drug use:  Not Currently           Allergies:   No Known Allergies           Review of Systems     Review of Systems    Constitutional: Negative for fatigue and fever.    Respiratory: Negative for shortness of breath and wheezing.     Cardiovascular: Negative for chest pain.    Gastrointestinal: Positive for abdominal pain.    Musculoskeletal: Negative for arthralgias, myalgias and neck pain.    Skin: Negative for pallor and rash.    Neurological: Negative for dizziness and headaches.    All other systems reviewed and are negative.           Physical Exam          Vitals:            03/09/20 1732  03/09/20 1738  03/09/20 1847          BP:    108/69       Pulse:    77       Resp:    16       SpO2:    100%  100%     Weight:  53.5 kg (118 lb)              Height:  5\' 4"  (1.626 m)            Physical Exam   Vitals and nursing note reviewed.   Constitutional:         General: She is not in acute distress.     Appearance: She is well-developed.    HENT:       Head: Normocephalic and atraumatic.      Right Ear: External ear normal.      Left Ear: External ear normal.      Nose: Nose normal.   Eyes:       Conjunctiva/sclera: Conjunctivae normal.  Cardiovascular:       Rate and Rhythm: Normal rate and regular rhythm.      Heart sounds: Normal heart sounds.    Pulmonary:       Effort: Pulmonary effort is normal. No respiratory distress.      Breath sounds: Normal breath sounds. No wheezing.    Abdominal:      General: Bowel sounds are normal.      Palpations: Abdomen is soft.      Tenderness: There is generalized  abdominal tenderness.     Musculoskeletal:          General: Normal range of motion.      Cervical back: Normal range of motion and neck supple.     Lymphadenopathy:       Cervical: No cervical adenopathy.   Skin :      General: Skin is warm and dry.      Findings: No rash.    Neurological:       Mental Status: She is alert and oriented to person, place, and time.      Cranial Nerves: No cranial nerve deficit.      Coordination: Coordination normal.   Psychiatric:         Behavior: Behavior normal.         Thought Content: Thought content normal.          Judgment: Judgment normal.                  Diagnostic Study Results        Labs -       No results found for this or any previous visit (from the past 12 hour(s)).      Radiologic Studies -      CT ABD PELV W CONT       Final Result     Cirrhosis with splenomegaly but no ascites.     Cholelithiasis     No acute process seen                 CT Results   (Last 48 hours)                                    03/09/20 1916    CT ABD PELV W CONT  Final result            Impression:    Cirrhosis with splenomegaly but no ascites.      Cholelithiasis      No acute process seen                       Narrative:    EXAM: CT ABD PELV W CONT             INDICATION: hx cirrhosis and ascites. Pain             COMPARISON: None               CONTRAST: 100 mL of Isovue-370.             TECHNIQUE:       Following the uneventful intravenous administration of contrast, thin axial      images were obtained through the abdomen and pelvis. Coronal and sagittal      reconstructions were generated. Oral contrast was not administered. CT dose      reduction was achieved through use of a  standardized protocol tailored for this      examination and automatic exposure control for dose modulation.             FINDINGS:       LOWER THORAX: No significant abnormality in the incidentally imaged lower chest.      LIVER: No mass. Lobulated.      BILIARY TREE: Contracted around gallstones. CBD is not dilated.      SPLEEN: Mild splenomegaly.      PANCREAS: No mass or ductal dilatation.      ADRENALS: Unremarkable.      KIDNEYS: No mass, calculus, or hydronephrosis. Right renal 8 mm hypodensity, for      which no additional evaluation is needed.      STOMACH: Unremarkable.      SMALL BOWEL: No dilatation or wall thickening.      COLON: No dilatation or wall thickening.      APPENDIX: Normal on axial image 55      PERITONEUM: No ascites or pneumoperitoneum.      RETROPERITONEUM: No lymphadenopathy or aortic aneurysm.      REPRODUCTIVE ORGANS: Anteverted uterus      URINARY BLADDER: No mass or calculus.      BONES: No destructive bone lesion.      ABDOMINAL WALL: No mass or hernia.      ADDITIONAL COMMENTS: N/A                                 CXR Results   (Last 48 hours)          None                       Medical Decision Making     I am the first provider for this patient.      I reviewed the vital signs, available nursing notes, past medical history, past surgical history, family history and social history.      Vital Signs-Reviewed the patient's vital signs.      Records Reviewed: Nursing Notes      Provider Notes (Medical Decision Making):    DDX cirrhosis ascites dyspepsia GERD peptic ulcer disease              Disposition:   home      DISCHARGE NOTE:             Care plan outlined and precautions discussed.  Patient has no new complaints, changes, or physical findings.  Results of tests were reviewed with the patient. All medications were reviewed with the patient; will d/c home with pepcid. All of pt's questions  and concerns were addressed. Patient was instructed and agrees to follow up with Hepatology, as well as to return to the ED upon further deterioration. Patient is ready to go home.        Follow-up Information               Follow up With  Specialties  Details  Why  Contact Info              Bryson Ha, MD  Hepatology  In 1 week    [address]   [address]   [place] [address]   [phone]                     Discharge Medication List as of 03/09/2020  8:01  PM              START taking these medications          Details        famotidine (Pepcid) 20 mg tablet  Take 1 Tablet by mouth two (2) times a day for 10 days., Normal, Disp-20 Tablet, R-0                     CONTINUE these medications which have NOT CHANGED          Details        pregabalin (LYRICA) 25 mg capsule  Take 1 Capsule by mouth two (2) times a day. Max Daily Amount: 50 mg., Normal, Disp-60 Capsule, R-3               furosemide (LASIX) 40 mg tablet  Take 40 mg by mouth daily., Historical Med               loratadine (CLARITIN) 10 mg tablet  Take 10 mg by mouth., Historical Med               rifAXIMin (XIFAXAN) 200 mg tablet  Take 550 mg by mouth two (2) times a day., Historical Med               spironolactone (ALDACTONE) 50 mg tablet  Take 50 mg by mouth daily., Historical Med               thiamine HCL (B-1) 100 mg tablet  Take 100 mg by mouth daily., Historical Med                         Procedures:   Procedures      Please note that this dictation was completed with Dragon, Advertising account planner.  Quite often unanticipated grammatical, syntax, homophones, and other interpretive errors are inadvertently transcribed by the computer software.  Please disregard  these  errors.  Additionally, please excuse any errors that have escaped final proofreading.        Diagnosis        Clinical Impression:       1.  Cirrhosis of liver without ascites, unspecified hepatic cirrhosis type (HCC)

## 2020-03-09 NOTE — ED Notes (Signed)
Patient (s) 1 given copy of dc instructions and 0 paper script(s) and 1 electronic scripts.  Patient (s) verbalized understanding of instructions and script (s).  Patient given a current medication reconciliation form and verbalized understanding of their medications.   Patient (s) verbalized understanding of the importance of discussing medications with  his or her physician or clinic they will be following up with.  Patient alert and oriented and in no acute distress.

## 2020-03-10 LAB — METABOLIC PANEL, COMPREHENSIVE
A-G Ratio: 1.1 (ref 1.1–2.2)
ALT (SGPT): 45 U/L (ref 12–78)
AST (SGOT): 30 U/L (ref 15–37)
Albumin: 3.7 g/dL (ref 3.5–5.0)
Alk. phosphatase: 81 U/L (ref 45–117)
Anion gap: 6 mmol/L (ref 5–15)
BUN/Creatinine ratio: 26 — ABNORMAL HIGH (ref 12–20)
BUN: 24 MG/DL — ABNORMAL HIGH (ref 6–20)
Bilirubin, total: 0.2 MG/DL (ref 0.2–1.0)
CO2: 28 mmol/L (ref 21–32)
Calcium: 9.2 MG/DL (ref 8.5–10.1)
Chloride: 103 mmol/L (ref 97–108)
Creatinine: 0.93 MG/DL (ref 0.55–1.02)
GFR est AA: >60 mL/min/{1.73_m2} (ref >60)
GFR est non-AA: >60 mL/min/{1.73_m2} (ref >60)
Globulin: 3.4 g/dL (ref 2.0–4.0)
Glucose: 90 mg/dL (ref 65–100)
Potassium: 4 mmol/L (ref 3.5–5.1)
Protein, total: 7.1 g/dL (ref 6.4–8.2)
Sodium: 137 mmol/L (ref 136–145)

## 2020-03-10 LAB — COMPREHENSIVE METABOLIC PANEL
ALT: 45 U/L (ref 12–78)
AST: 30 U/L (ref 15–37)
Albumin/Globulin Ratio: 1.1 (ref 1.1–2.2)
Albumin: 3.7 g/dL (ref 3.5–5.0)
Alkaline Phosphatase: 81 U/L (ref 45–117)
Anion Gap: 6 mmol/L (ref 5–15)
BUN: 24 MG/DL — ABNORMAL HIGH (ref 6–20)
Bun/Cre Ratio: 26 — ABNORMAL HIGH (ref 12–20)
CO2: 28 mmol/L (ref 21–32)
Calcium: 9.2 MG/DL (ref 8.5–10.1)
Chloride: 103 mmol/L (ref 97–108)
Creatinine: 0.93 MG/DL (ref 0.55–1.02)
EGFR IF NonAfrican American: >60 mL/min/{1.73_m2} (ref >60)
GFR African American: >60 mL/min/{1.73_m2} (ref >60)
Globulin: 3.4 g/dL (ref 2.0–4.0)
Glucose: 90 mg/dL (ref 65–100)
Potassium: 4 mmol/L (ref 3.5–5.1)
Sodium: 137 mmol/L (ref 136–145)
Total Bilirubin: 0.2 MG/DL (ref 0.2–1.0)
Total Protein: 7.1 g/dL (ref 6.4–8.2)

## 2020-03-10 MED ORDER — FAMOTIDINE 20 MG TAB
20 mg | ORAL_TABLET | Freq: Two times a day (BID) | ORAL | 0 refills | Status: AC
Start: 2020-03-10 — End: 2020-03-19

## 2020-03-21 IMAGING — DX ABDOMEN - 2 VIEW
4 series · 4 of 4 positions shown · non-contrast
Comparison: None.

CLINICAL DATA: Abdominal pain.  Confusion.

EXAM:
ABDOMEN - 2 VIEW

[abdomen erect]
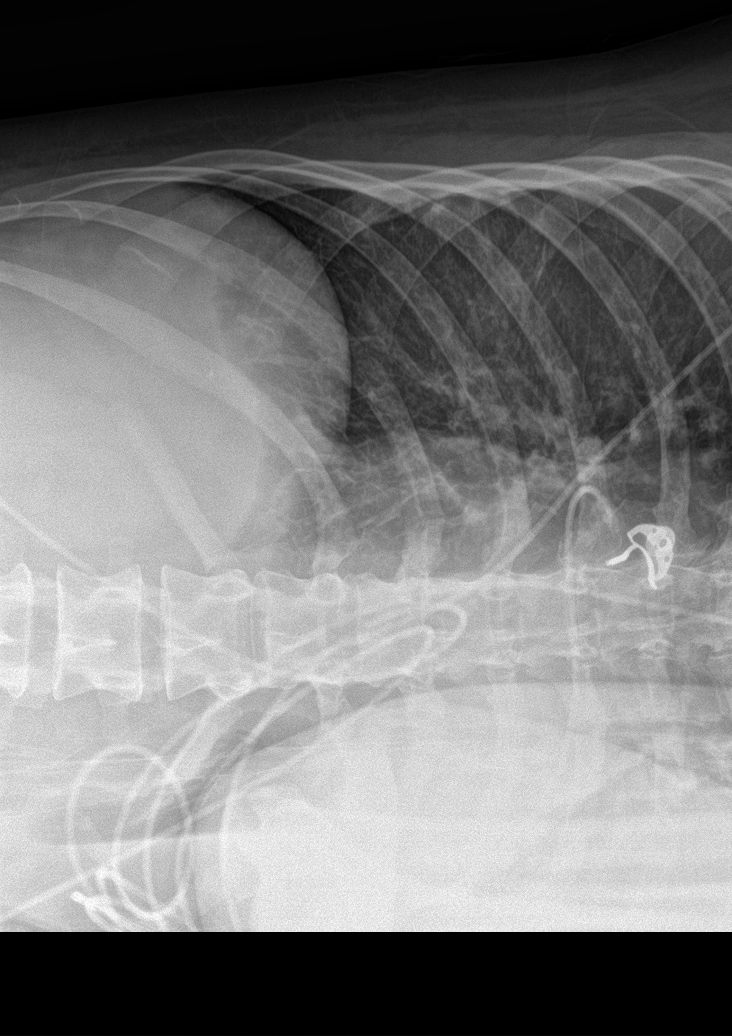

[abdomen supine (1 of 2)]
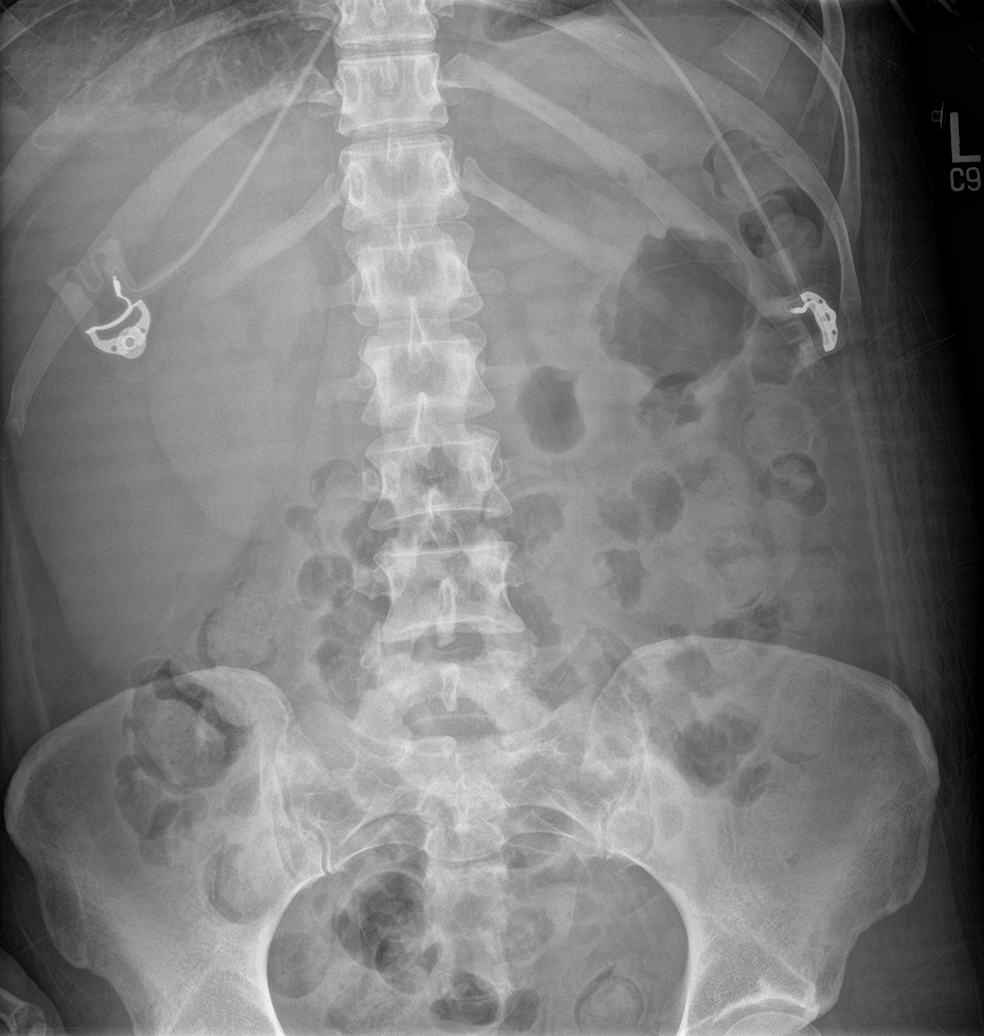

[abdomen supine (2 of 2)]
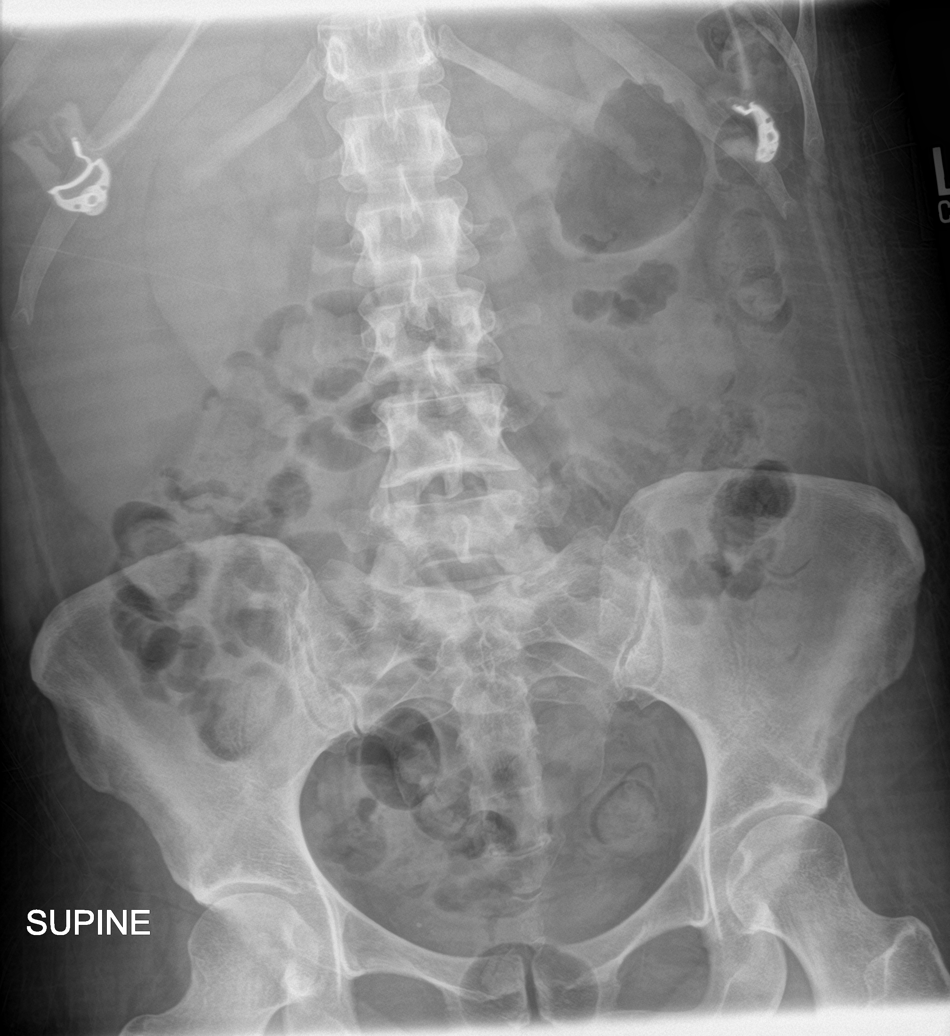

[abdomen decu]
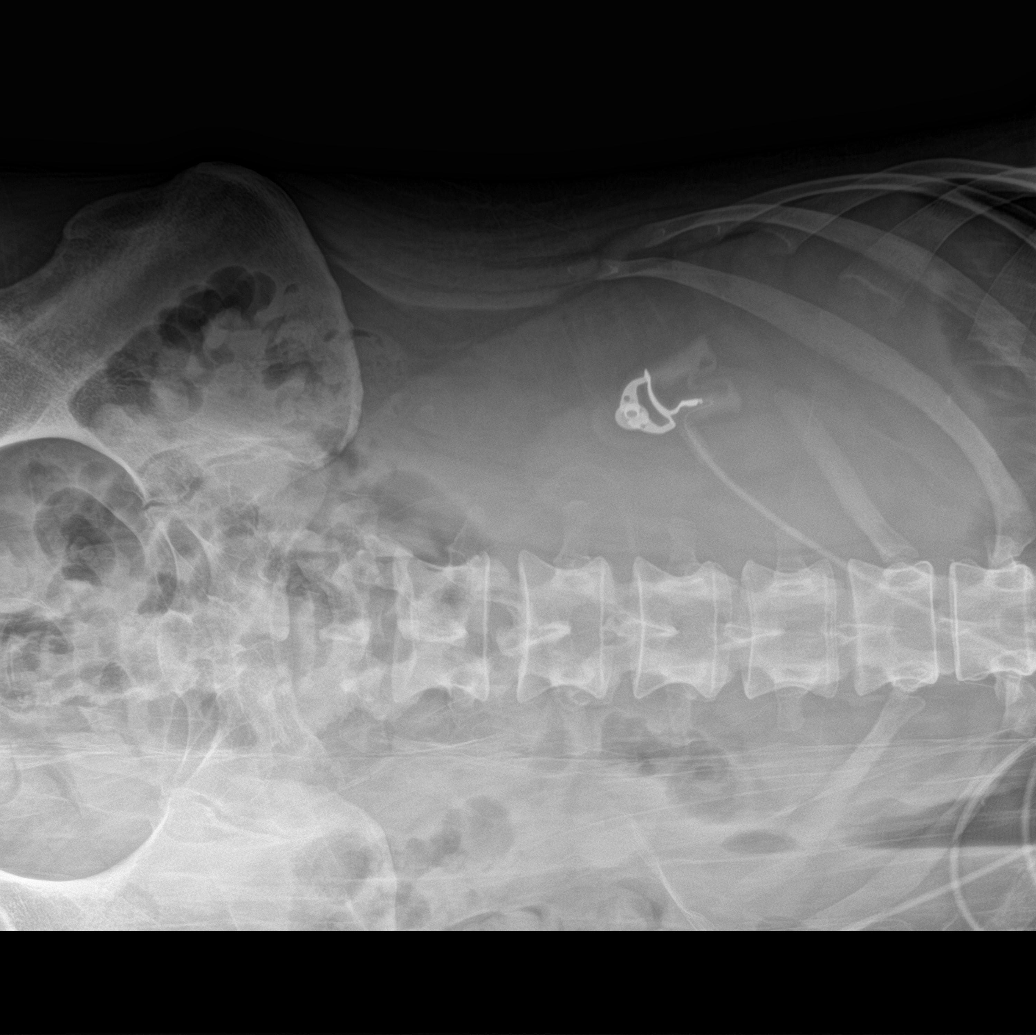

[4 of 4 positions shown; findings below may reference images not displayed]

FINDINGS: The bowel gas pattern is normal. There is no evidence of free air.
No radio-opaque calculi or other significant radiographic
abnormality is seen. No acute osseous abnormality.
IMPRESSION: Negative.

## 2020-04-23 ENCOUNTER — Encounter: Attending: Neurology

## 2020-05-21 ENCOUNTER — Other Ambulatory Visit (INDEPENDENT_AMBULATORY_CARE_PROVIDER_SITE_OTHER): Payer: Self-pay | Admitting: Internal Medicine

## 2020-05-21 DIAGNOSIS — K7031 Alcoholic cirrhosis of liver with ascites: Secondary | ICD-10-CM

## 2020-05-21 DIAGNOSIS — F1020 Alcohol dependence, uncomplicated: Secondary | ICD-10-CM

## 2020-05-21 DIAGNOSIS — D75839 Thrombocytosis, unspecified: Secondary | ICD-10-CM

## 2020-05-21 NOTE — Telephone Encounter (Signed)
Pt a requests a refill for Lactose Solution. Does not show on chart.       [id number] - [place] - [address] Phone:  [phone]   Fax:  [phone]

## 2020-05-21 NOTE — Telephone Encounter (Signed)
Last filled April 2021.   Last o/v July 2021.  Patient does not have an upcoming appointment.    Queued up

## 2020-05-22 NOTE — Telephone Encounter (Addendum)
Lactulose should now be managed by her GI

## 2020-05-29 ENCOUNTER — Encounter

## 2020-05-29 NOTE — Telephone Encounter (Signed)
 Requested Prescriptions     Pending Prescriptions Disp Refills   . pregabalin (LYRICA) 25 mg capsule 60 Capsule 3     Sig: Take 1 Capsule by mouth two (2) times a day. Max Daily Amount: 50 mg.     Pt's mother is also requesting gabapentin  and said it was prescribed by a different doctor before but it was more effective than pregabalin

## 2020-05-30 MED ORDER — PREGABALIN 25 MG CAP
25 mg | ORAL_CAPSULE | Freq: Two times a day (BID) | ORAL | 3 refills | Status: DC
Start: 2020-05-30 — End: 2020-06-25

## 2020-05-31 ENCOUNTER — Encounter (HOSPITAL_BASED_OUTPATIENT_CLINIC_OR_DEPARTMENT_OTHER): Payer: Self-pay | Admitting: Family Nurse Practitioner

## 2020-05-31 NOTE — Progress Notes (Signed)
No show

## 2020-06-25 ENCOUNTER — Ambulatory Visit: Attending: Neurology

## 2020-06-25 ENCOUNTER — Ambulatory Visit: Admit: 2020-06-25 | Payer: MEDICAID | Attending: Neurology

## 2020-06-25 DIAGNOSIS — G621 Alcoholic polyneuropathy: Secondary | ICD-10-CM

## 2020-06-25 MED ORDER — GABAPENTIN 600 MG TAB
600 mg | ORAL_TABLET | Freq: Two times a day (BID) | ORAL | 3 refills | Status: DC
Start: 2020-06-25 — End: 2020-10-30

## 2020-06-25 NOTE — Progress Notes (Signed)
Neurology Progress Note    Patient ID:  Michelle Singleton  [id number]  35 y.o.  10/08/1985      Subjective:   History:  Michelle Singleton is a  female who has  Anxiety and alcohol abuse since teenage yrs with liver cirrhosis (Drinks 8-10 shots of liquor, 1 bottle of wine and 3-6 hard llemonades daily) who comes in with cognitive issues. MMSE is 29/30. Consideration includes mild cognitive impairment due to history of alcohol abuse and current hepatic encephalopathy. Patient also has bilateral lower extremity paresthesias, weakness and difficulty walking and balancing. Was seeing a neurologist at [facility] Dr Lyndel Safe and was placed on Gabapentin 500 mg BID and  Cymbalta 30 mg BID was added. Also on Thiamine 100 mg every day. 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  ??  Since last time, patient went back to drinking unfortunately. Patient now 2 months and 8 days sober.     Needs to take Lactulose due to elevated ammonia for hepatic encephalopathy  Needs to go back to Thiamine    Still gets numbness of legs now on Pregabalin 25 mg BID with leg swelling. Previously Gabapentin 600 mg BID which worked the best. Cymbalta did not work.         ROS:  Per HPI-  Otherwise the remainder of ROS was negative    Social Hx  Social History     Socioeconomic History   ??? Marital status: UNKNOWN   Tobacco Use   ??? Smoking status: Current Every Day Smoker     Packs/day: 0.50   ??? Smokeless tobacco: Never Used   Substance and Sexual Activity   ??? Alcohol use: Yes     Comment: clean since dec. 3    ??? Drug use: Not Currently   ??? Sexual activity: Yes     Partners: Male     Birth control/protection: Condom       Meds:  Current Outpatient Medications on File Prior to Visit   Medication Sig Dispense Refill   ??? lactulose (CHRONULAC) 10 gram/15 mL solution TAKE 30ML BY MOUTH TWICE A DAY     ??? loratadine (CLARITIN) 10 mg tablet Take 10 mg by mouth.     ??? rifAXIMin (XIFAXAN) 200 mg tablet Take 550 mg by mouth two (2) times a  day.     ??? thiamine HCL (B-1) 100 mg tablet Take 100 mg by mouth daily.     ??? furosemide (LASIX) 40 mg tablet Take 40 mg by mouth daily. (Patient not taking: Reported on 06/25/2020)     ??? spironolactone (ALDACTONE) 50 mg tablet Take 50 mg by mouth daily. (Patient not taking: Reported on 06/25/2020)       No current facility-administered medications on file prior to visit.       Imaging:    CT Results (recent):  Results from Hospital Encounter encounter on 03/09/20    CT ABD PELV W CONT    Narrative  EXAM: CT ABD PELV W CONT    INDICATION: hx cirrhosis and ascites. Pain    COMPARISON: None    CONTRAST: 100 mL of Isovue-370.    TECHNIQUE:  Following the uneventful intravenous administration of contrast, thin axial  images were obtained through the abdomen and pelvis. Coronal and sagittal  reconstructions were generated. Oral contrast was not administered. CT dose  reduction was achieved through use of a standardized protocol tailored for this  examination and automatic exposure control for dose modulation.  FINDINGS:  LOWER THORAX: No significant abnormality in the incidentally imaged lower chest.  LIVER: No mass. Lobulated.  BILIARY TREE: Contracted around gallstones. CBD is not dilated.  SPLEEN: Mild splenomegaly.  PANCREAS: No mass or ductal dilatation.  ADRENALS: Unremarkable.  KIDNEYS: No mass, calculus, or hydronephrosis. Right renal 8 mm hypodensity, for  which no additional evaluation is needed.  STOMACH: Unremarkable.  SMALL BOWEL: No dilatation or wall thickening.  COLON: No dilatation or wall thickening.  APPENDIX: Normal on axial image 55  PERITONEUM: No ascites or pneumoperitoneum.  RETROPERITONEUM: No lymphadenopathy or aortic aneurysm.  REPRODUCTIVE ORGANS: Anteverted uterus  URINARY BLADDER: No mass or calculus.  BONES: No destructive bone lesion.  ABDOMINAL WALL: No mass or hernia.  ADDITIONAL COMMENTS: N/A    Impression  Cirrhosis with splenomegaly but no ascites.  Cholelithiasis  No acute process  seen      MRI Results (recent):  No results found for this or any previous visit.      IR Results (recent):  No results found for this or any previous visit.      VAS/US Results (recent):  No results found for this or any previous visit.      Reviewed records in [facility] and media tab today    Lab Review     No visits with results within 3 Month(s) from this visit.   Latest known visit with results is:   Admission on 03/09/2020, Discharged on 03/09/2020   Component Date Value Ref Range Status   ??? WBC 03/09/2020 7.2  3.6 - 11.0 K/uL Final   ??? RBC 03/09/2020 3.70* 3.80 - 5.20 M/uL Final   ??? HGB 03/09/2020 11.5  11.5 - 16.0 g/dL Final   ??? HCT 03/09/2020 35.0  35.0 - 47.0 % Final   ??? MCV 03/09/2020 94.6  80.0 - 99.0 FL Final   ??? MCH 03/09/2020 31.1  26.0 - 34.0 PG Final   ??? MCHC 03/09/2020 32.9  30.0 - 36.5 g/dL Final   ??? RDW 03/09/2020 14.6* 11.5 - 14.5 % Final   ??? PLATELET 03/09/2020 160  150 - 400 K/uL Final   ??? MPV 03/09/2020 11.6  8.9 - 12.9 FL Final   ??? NRBC 03/09/2020 0.0  0 PER 100 WBC Final   ??? ABSOLUTE NRBC 03/09/2020 0.00  0.00 - 0.01 K/uL Final   ??? NEUTROPHILS 03/09/2020 45  32 - 75 % Final   ??? LYMPHOCYTES 03/09/2020 46  12 - 49 % Final   ??? MONOCYTES 03/09/2020 7  5 - 13 % Final   ??? EOSINOPHILS 03/09/2020 1  0 - 7 % Final   ??? BASOPHILS 03/09/2020 1  0 - 1 % Final   ??? IMMATURE GRANULOCYTES 03/09/2020 0  0.0 - 0.5 % Final   ??? ABS. NEUTROPHILS 03/09/2020 3.2  1.8 - 8.0 K/UL Final   ??? ABS. LYMPHOCYTES 03/09/2020 3.3  0.8 - 3.5 K/UL Final   ??? ABS. MONOCYTES 03/09/2020 0.5  0.0 - 1.0 K/UL Final   ??? ABS. EOSINOPHILS 03/09/2020 0.1  0.0 - 0.4 K/UL Final   ??? ABS. BASOPHILS 03/09/2020 0.0  0.0 - 0.1 K/UL Final   ??? ABS. IMM. GRANS. 03/09/2020 0.0  0.00 - 0.04 K/UL Final   ??? DF 03/09/2020 AUTOMATED    Final   ??? Sodium 03/09/2020 137  136 - 145 mmol/L Final   ??? Potassium 03/09/2020 4.0  3.5 - 5.1 mmol/L Final   ??? Chloride 03/09/2020 103  97 - 108 mmol/L Final   ???  CO2 03/09/2020 28  21 - 32 mmol/L Final   ??? Anion gap  03/09/2020 6  5 - 15 mmol/L Final   ??? Glucose 03/09/2020 90  65 - 100 mg/dL Final   ??? BUN 03/09/2020 24* 6 - 20 MG/DL Final   ??? Creatinine 03/09/2020 0.93  0.55 - 1.02 MG/DL Final   ??? BUN/Creatinine ratio 03/09/2020 26* 12 - 20   Final   ??? GFR est AA 03/09/2020 >60  >60 ml/min/1.70m Final   ??? GFR est non-AA 03/09/2020 >60  >60 ml/min/1.77mFinal    Estimated GFR is calculated using the IDMS-traceable Modification of Diet in Renal Disease (MDRD) Study equation, reported for both African Americans (GFRAA) and non-African Americans (GFRNA), and normalized to 1.7328mody surface area. The physician must decide which value applies to the patient.   ??? Calcium 03/09/2020 9.2  8.5 - 10.1 MG/DL Final   ??? Bilirubin, total 03/09/2020 0.2  0.2 - 1.0 MG/DL Final   ??? ALT (SGPT) 03/09/2020 45  12 - 78 U/L Final   ??? AST (SGOT) 03/09/2020 30  15 - 37 U/L Final   ??? Alk. phosphatase 03/09/2020 81  45 - 117 U/L Final   ??? Protein, total 03/09/2020 7.1  6.4 - 8.2 g/dL Final   ??? Albumin 03/09/2020 3.7  3.5 - 5.0 g/dL Final   ??? Globulin 03/09/2020 3.4  2.0 - 4.0 g/dL Final   ??? A-G Ratio 03/09/2020 1.1  1.1 - 2.2   Final   ??? Color 03/09/2020 YELLOW/STRAW    Final    Color Reference Range: Straw, Yellow or Dark Yellow   ??? Appearance 03/09/2020 CLEAR  CLEAR   Final   ??? Specific gravity 03/09/2020 1.015  1.003 - 1.030   Final   ??? pH (UA) 03/09/2020 6.5  5.0 - 8.0   Final   ??? Protein 03/09/2020 Negative  NEG mg/dL Final   ??? Glucose 03/09/2020 Negative  NEG mg/dL Final   ??? Ketone 03/09/2020 Negative  NEG mg/dL Final   ??? Bilirubin 03/09/2020 Negative  NEG   Final   ??? Blood 03/09/2020 Negative  NEG   Final   ??? Urobilinogen 03/09/2020 0.2  0.2 - 1.0 EU/dL Final   ??? Nitrites 03/09/2020 Negative  NEG   Final   ??? Leukocyte Esterase 03/09/2020 TRACE* NEG   Final   ??? WBC 03/09/2020 0-4  0 - 4 /hpf Final   ??? RBC 03/09/2020 0-5  0 - 5 /hpf Final   ??? Epithelial cells 03/09/2020 FEW  FEW /lpf Final    Epithelial cell category consists of squamous cells  and /or transitional urothelial cells. Renal tubular cells, if present, are separately identified as such.   ??? Bacteria 03/09/2020 Negative  NEG /hpf Final   ??? UA:UC IF INDICATED 03/09/2020 CULTURE NOT INDICATED BY UA RESULT  CNI   Final   ??? Pregnancy test,urine (POC) 03/09/2020 Negative  NEG   Final         Objective:       Exam:  Visit Vitals  BP 114/82 (BP 1 Location: Left arm, BP Patient Position: Sitting, BP Cuff Size: Adult)   Pulse 79   Resp 17   Wt 51.3 kg (113 lb)   SpO2 99%   BMI 19.40 kg/m??     Gen: Awake, alert, follows commands  Appropriate appearance, normal speech.  Oriented to all spheres.  No visual field defect on confrontation exam.  Full eyes movement, with no nystagmus, no diplopia, no  ptosis.  Normal gag and swallow.  All remaining cranial nerves were normal  Motor function: 5/5 in all extremities  Sensory: intact to LT, PP and JPS  Good FTN and HTS   Gait: Normal    Assessment:       ICD-10-CM ICD-9-CM    1. Alcoholic peripheral neuropathy (HCC)  G62.1 357.5 gabapentin (NEURONTIN) 600 mg tablet      REFERRAL TO NEUROPSYCHOLOGY   2. 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      MRI brain (06/29/19): Non-specific white matter changes    Plan:   1. Patient already quit drinking and has been sober for 2 months and 8 days c/o alcohol program  2. Will switch back to Gabapentin 600 mg BID which worked in the past for leg symptoms (previous alcohol program will not allow Gabapentin, but current one will)  3. Advise to go back on Thiamine 100 mg every day   4. Patient wants to go back to work. Will refer to Dr Novella Rob for neuropsychological testing   5. Follow up with GI regarding hepatic encephalopathy (also wants to see a local GI)      Follow-up and Dispositions    ?? Return in about 3 months (around 09/24/2020).               Shelbie Hutching, MD  Diplomate,  American Board of Psychiatry and Neurology  Diplomate, Neuromuscular Medicine  Diplomate, American Board of Electrodiagnostic Medicine

## 2020-06-25 NOTE — Progress Notes (Signed)
Memory is getting worse  Forgetting things within min  Started slurring words   Would like to get on to gaba  Feet are hurting every day  Long term memory is still good

## 2020-06-26 ENCOUNTER — Encounter: Attending: Neurology

## 2020-09-03 ENCOUNTER — Ambulatory Visit: Attending: Clinical Neuropsychologist

## 2020-09-03 ENCOUNTER — Ambulatory Visit: Admit: 2020-09-03 | Payer: MEDICAID | Attending: Clinical Neuropsychologist

## 2020-09-03 DIAGNOSIS — G3184 Mild cognitive impairment, so stated: Secondary | ICD-10-CM

## 2020-09-03 NOTE — Progress Notes (Signed)
[facility]  [facility]  NEUROLOGY CLINIC   [address] [place] [address]   [place], [place] [address]   [phone] Office   [phone] Fax      Neuropsychology    Initial Diagnostic Interview Note      Referral:  Dr. Cline Crock Diez is a 35 y.o. right handed engaged  Caucasian female who was accompanied by her mother to the initial clinical interview on 09/03/20 .  Please refer to her medical records for details pertaining to her history.   At the start of the appointment, I reviewed the patient's [facility] Epic Chart (including Media scanned in from previous providers) for the active Problem List, all pertinent Past Medical Hx, medications, recent radiologic and laboratory findings.  In addition, I reviewed pt's documented Immunization Record and Encounter History. High school completed with a history of ADHD for which she took Adderall.  She completed high school with a standard diploma. She is not currently working. She has a history of significant alcohol abuse since she was a teen and was drinking 10 shots of liquor, 1 bottle of mine, or 3-6 hard lemonades per day. She is drinking a couple of times a week. She is now down to drinking a six pack every couple of days. 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  Thoughts get jumbled. She will know what she wants to say but the word doesn't come out.  She loses train of thought.  She has a license but doesn't have a car.  Patient also has??bilateral lower extremity paresthesias, weakness and difficulty walking and balancing. She has been Gabapentin and is on 500 mg of that.  She gets irritated at times.  Thiamine 100 mg every day.  Feels like mom is treating her like a child at times.  She doesn't live with her mom anymore. She has been to rehab a couple of times and has since left mom's recovery. She and her fiance are living in a  motel and thinking of moving to [place].  He is looking for jobs there.      Neuropsychological Mental Status Exam (NMSE):      Historian: Good  Praxis: No UE apraxia  R/L Orientation: Intact to self and to other  Dress: within normal limits   Weight: within normal limits   Appearance/Hygiene: within normal limits   Gait: within normal limits   Assistive Devices:Glasses  Mood: within normal limits   Affect: within normal limits   Comprehension: within normal limits   Thought Process: within normal limits   Expressive Language: Pressured speech   Receptive Language: within normal limits   Motor:  No cognitive or motor perseveration  ETOH: drinking as noted above  Tobacco: 1/2 pack a day, and has cut back  Marijuana: Denied  Illicit:  Denied  SI/HI: Denied  Psychosis: Denied  Insight: Within normal limits  Judgment: Within normal limits  Other Psych: She is agitated with her mother, whenever her mother says something.        Past Medical History:   Diagnosis Date   ??? Hypertension    ??? Liver disease        History reviewed. No pertinent surgical history.    No Known Allergies    History reviewed. No pertinent family history.    Social History     Tobacco Use   ??? Smoking status: Current Every Day  Smoker     Packs/day: 0.50   ??? Smokeless tobacco: Never Used   Substance Use Topics   ??? Alcohol use: Yes     Comment: clean since dec. 3    ??? Drug use: Not Currently       Current Outpatient Medications   Medication Sig Dispense Refill   ??? lactulose (CHRONULAC) 10 gram/15 mL solution TAKE BY MOUTH TWICE A DAY     ??? gabapentin (NEURONTIN) 600 mg tablet Take 1 Tablet by mouth two (2) times a day. Max Daily Amount: 1,200 mg. For Trigeminal Neuralgia 60 Tablet 3   ??? loratadine (CLARITIN) 10 mg tablet Take 10 mg by mouth.     ??? rifAXIMin (XIFAXAN) 200 mg tablet Take 550 mg by mouth two (2) times a day.     ??? thiamine HCL (B-1) 100 mg tablet Take 100 mg by mouth daily.           Plan:  Obtain authorization for testing from  insurance company.  Report to follow once testing, scoring, and interpretation completed.  ? Organic based neurocognitive issues versus mood disorder or combination of same.  ? Problems organic, functional, or both? This note will not be viewable in MyChart.

## 2020-09-11 NOTE — Telephone Encounter (Signed)
Patient moved to [facility] 09/04/2020. Unable to schedule testing. Will call back for referral if she chooses to see Dr. Ardell Isaacs.

## 2020-09-20 NOTE — Telephone Encounter (Signed)
Please call mother to discuss a doctor for her daughter.     Dr. Wynonia Hazard told them if she moves to the [place] to call him for a doctor.      Please call.

## 2020-09-20 NOTE — Telephone Encounter (Signed)
Left a message for patient's mother with the providers contact number and address, the provider is [employer] [phone]

## 2020-09-26 NOTE — Telephone Encounter (Signed)
Pt mom called to get a referral to a neurologist in [place]. Patient moved to [facility].

## 2020-09-26 NOTE — Telephone Encounter (Signed)
Pt mom called about other recommendations about neuropsych providers [place]. [place] limited transportation.

## 2020-09-26 NOTE — Telephone Encounter (Signed)
Returned call no answer left message suggested she call the member services number for her insurance for in network providers in her area.

## 2020-09-28 ENCOUNTER — Encounter: Attending: Neurology

## 2020-09-28 NOTE — Telephone Encounter (Signed)
Called Michelle Singleton. hipaa verified. Gave her the address and telephone to the Neurology office in [place]. Michelle Singleton verbalizes understanding.

## 2020-10-29 ENCOUNTER — Encounter

## 2020-10-30 MED ORDER — GABAPENTIN 600 MG TAB
600 mg | ORAL_TABLET | ORAL | 3 refills | Status: DC
Start: 2020-10-30 — End: 2021-03-05

## 2020-12-26 NOTE — Telephone Encounter (Signed)
Please call for setting up testing.

## 2020-12-27 NOTE — Telephone Encounter (Signed)
Patient mother returning a miss call about her daughter being schedule for testing.    Please call again

## 2021-01-09 NOTE — Telephone Encounter (Signed)
LM to conf 01/11/21

## 2021-01-11 ENCOUNTER — Ambulatory Visit: Attending: Clinical Neuropsychologist

## 2021-01-11 ENCOUNTER — Ambulatory Visit: Admit: 2021-01-11 | Payer: MEDICAID | Attending: Clinical Neuropsychologist

## 2021-01-11 DIAGNOSIS — G3184 Mild cognitive impairment, so stated: Secondary | ICD-10-CM

## 2021-01-11 NOTE — Progress Notes (Signed)
[facility]  [facility]  NEUROLOGY CLINIC   [address] 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 right handed engaged  Caucasian female who was accompanied by her mother to the initial clinical interview on 09/03/20 .  Please refer to her medical records for details pertaining to her history.   At the start of the appointment, I reviewed the patient's [facility] Epic Chart (including Media scanned in from previous providers) for the active Problem List, all pertinent Past Medical Hx, medications, recent radiologic and laboratory findings.  In addition, I reviewed pt's documented Immunization Record and Encounter History. High school completed with a history of ADHD for which she took Adderall.  She completed high school with a standard diploma. She is not currently working. She has a history of significant alcohol abuse since she was a teen and was drinking 10 shots of liquor, 1 bottle of mine, or 3-6 hard lemonades per day. She is drinking a couple of times a week. She is now down to drinking a six pack every couple of days. 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  Thoughts get jumbled. She will know what she wants to say but the word doesn't come out.  She loses train of thought.  She has a license but doesn't have a car.  Patient also has bilateral lower extremity paresthesias, weakness and difficulty walking and balancing. She has been Gabapentin and is on 500 mg of that.  She gets irritated at times.  Thiamine 100 mg every day.  Feels like mom is treating her like a child at times.  She doesn't live with her mom anymore. She has been to rehab a couple of times and has since left mom's recovery. She and her fiance are living in a motel and thinking  of moving to [place].  He is looking for jobs there.      Neuropsychological Mental Status Exam (NMSE):      Historian: Good  Praxis: No UE apraxia  R/L Orientation: Intact to self and to other  Dress: within normal limits   Weight: within normal limits   Appearance/Hygiene: within normal limits   Gait: within normal limits   Assistive Devices:Glasses  Mood: within normal limits   Affect: within normal limits   Comprehension: within normal limits   Thought Process: within normal limits   Expressive Language: Pressured speech   Receptive Language: within normal limits   Motor:  No cognitive or motor perseveration  ETOH: drinking as noted above  Tobacco: 1/2 pack a day, and has cut back  Marijuana: Denied  Illicit:  Denied  SI/HI: Denied  Psychosis: Denied  Insight: Within normal limits  Judgment: Within normal limits  Other Psych: She is agitated with her mother, whenever her mother says something.        Past Medical History:   Diagnosis Date    Hypertension     Liver disease        History reviewed. No pertinent surgical history.    No Known Allergies    History reviewed. No pertinent family history.    Social History     Tobacco Use    Smoking status: Every Day     Packs/day: 0.50  Types: Cigarettes    Smokeless tobacco: Never   Substance Use Topics    Alcohol use: Yes     Comment: clean since dec. 3     Drug use: Not Currently       Current Outpatient Medications   Medication Sig Dispense Refill    gabapentin (NEURONTIN) 600 mg tablet TAKE 1 TABLET BY MOUTH TWO (2) TIMES A DAY. MAX DAILY AMOUNT: 2 TABS 60 Tablet 3    lactulose (CHRONULAC) 10 gram/15 mL solution TAKE BY MOUTH TWICE A DAY      loratadine (CLARITIN) 10 mg tablet Take 10 mg by mouth.      rifAXIMin (XIFAXAN) 200 mg tablet Take 550 mg by mouth two (2) times a day.      thiamine HCL (B-1) 100 mg tablet Take 100 mg by mouth daily.           Plan:  Obtain authorization for testing from insurance company.  Report to follow once testing, scoring,  and interpretation completed.  ? Organic based neurocognitive issues versus mood disorder or combination of same.  ? Problems organic, functional, or both? This note will not be viewable in MyChart.        Neuropsychological Evaluation  Patient Testing 01/11/21 Report Completed 02/05/21  A Psychometrist Assisted w/ portions of this evaluation while under my direct supervision    Please refer to the patient's initial interview progress note (copied above) and her medical records for details pertaining to her history.  Today's neuropsychological test scores follow:    The following assessment procedures were performed:      Neuropsychologist Performed, Interpreted, & Reported: Neuropsychological Mental Status Exam, Revised Memory & Behavior Checklist, Mini Mental State Exam, Clock Drawing Test, Test Of Premorbid Functioning, McGill-Melzack Pain Questionnaire,  History Taking  & Clinical Interview With The Patient, Additional History Taking w/ The Patient's Mother, PAI, CPT-III, Review Of Available Records.    Psychometrist Administered & Neuropsychologist Interpreted & Neuropsychologist Reported: Chief Financial Officer, Grooved Pegboard Test, CATA,  Wechsler Adult Intelligence Scale - IV, Verbal Fluency Tests, [employer] - Revised, Trailmaking Test Parts A & B, [place] Verbal Learning Test - 3, Rey Complex Figure Test, Beck Depression Inventory - II, Beck Anxiety Inventory,.      Test Findings:  Note:  The patient???s raw data have been compared with currently available norms which include demographic corrections for age, gender, and/or education.  Sometimes, the patient???s scores are compared to demographically similar individuals as close to the patient???s age, education level, etc., as possible.  "Average" is viewed as being +/- 1 standard deviation (SD) from the stated mean for a particular test score.  "Low average" is viewed as being between 1 and 2 SD below the mean, and above average is viewed as being 1  and 2 SD above the mean.  Scores falling in the ???borderline??? range (between 1-1/2 and 2 SD below the mean) are viewed with particular attention as to whether they are normal or abnormal neurocognitive test scores.  Other methods of inference in analyzing the test data are also utilized, including the pattern and range of scores in the profile, bilateral motor functions, and the presence, if any, of pathognomonic signs.      Behaviorally, the patient was friendly and cooperative and appeared motivated to perform well during this examination.  On the first day of testing, a measure of posttraumatic stress was not administered as time ran out from the end of the office today.  She is scheduled to finish to return to complete testing but did not show.  Many messages were left to see if she wanted to resume and reschedule to no avail.  Her mother apparently said that she thinks the patient is in [place] and the patient's boyfriend took the patient's phone and she is unable to reach her. Marland Kitchen Within this context, the results of this evaluation are viewed as a valid reflection of the patient???s actual neurocognitive and emotional status.       Her structured word list fluency, as assessed by the FAS Test, was within the average range with a T score of 46.  Category fluency was below average with a T score of 40.  Confrontation naming, as assessed by the [facility], was within the mildly impaired range with a T score of 35.  This pattern of performance is not indicative of a patient who is at increased risk for day-to-day problems with verbal fluency.  Confrontation naming is mildly impaired.     The patient was administered the Conners??? Continuous Performance Test - III, a computer-administered test of sustained attention, and review of the subscales within this instrument did not reveal clinically significant concerns for inattentiveness or impulsivity.  Auditory attention, as assessed by the CAT A,  revealed mild concern for inattentiveness and impulsivity.  This pattern of performance is indicative of a patient is at mildly increased risk for day-to-day problems with auditory attention.  Visual attention is normal.     The patient was administered the Wechsler Adult Intelligence Scale - IV.  See Appendix I for full breakdown of IQ test scores (scanned into media section of this EMR).  As can be seen, there was a clinically significant difference between her low average range Working Memory Index score of 86 (18th %ile) and her average range Processing Speed Index score of 108 (70th %ile).  Her Verbal Comprehension Index score of 89 (23rd %ile) was within the low average range.  Her Perceptual Reasoning Index score of 92 (30th %ile) was within the average range.  This pattern of performance is not indicative of a patient who is at increased risk for day-to-day problems with working memory and/or processing speed.  Working memory is lower than what would be expected based on her performance on a task estimating premorbid functioning was.  This often signals functional interference.     The patient was administered the [place] Verbal Learning Test  - 3 and generated in the impaired range though generally positive learning curve over 5 repeated auditory word list learning trials.  An interference trial was normal.  Recall for the original word list was technically impaired after both short and long delays, but review of the raw scores does not reveal significant decline from with the information that she was able to learn.  Recognition recall was borderline.  Forced choice recall was normal, suggesting good effort.  There is a functional quality to this test performance.  This pattern of performance is indicative of a patient who is at mildly increased risk for day-to-day problems with auditory memory.  Auditory learning is also impaired.     The patient???s performance on the copy portion of the Rey Complex  Figure Test was within normal limits  (11-16th %ile).  Recall for the complex design was within the impaired range after both short (<1st %ile) and long (<1st %ile) delays.  Recognition recall was low normal, though.  There is a functional quality to this performance.  Simple timed visual motor sequencing (Trailmaking Test Part A) was within the mildly impaired range with a T score of 39.  Her performance on a similar, but more complex task of timed visual motor sequencing (Trailmaking Test Part B) was within the mildly impaired range with a T score of 36.  She made only 1 sequencing error on this latter completed test.  Taken together, this pattern of performance is not indicative of a patient who is at increased risk for day-to-day problems with executive functioning.     Grip strength was normal bilaterally.  Fine motor dexterity was normal bilaterally.  This does not raise concern for particularly focal or lateralized brain dysfunction.   The patient rated her current level of pain as "2/5-discomforting" on the McGill-Melzack Pain Questionnaire.  She reported pain in her lower abdomen, hands, legs, and feet.     Marland KitchenHer Beck Depression Inventory -II score of 7 was within the minimally depressed range.  Her Beck Anxiety Inventory score of 44 reflected severe anxiety.       The patient was also administered the Personality Assessment Inventory and generated a valid profile for interpretation.  Within this context, her use of drugs and alcohol have had significantly negative impact upon her life.  Substance abuse issues are quite likely.  Psychopathological phenomena including concentration problems, anxiety, and depression may be noted as withdrawal symptoms.  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  There is maladaptive behavior patterns aimed at controlling anxiety are reported.  She is not reporting PTSD on this measure.  Interpersonally, she is submissive, conforming, and na??ve.  She finds it  difficult to assert herself or display any anger in relationships.  This is driven by anxiety about rejection by other people.  She will feel helpless and overwhelmed under mild pressure and independently seeks the assistance of other people.  This will put her in situations where others could try and take advantage of her.  She is highly motivated for treatment.     Madlyn Frankel completed the [place]-3 and the ABAS-3.  He reports rather severe concerns regarding the patient's communication abilities, but does not report concerns about the patient's daily living skills or socialization.  He reported borderline range concerns regarding the patient's conceptual skills (SS = 70), but did not report concerns regarding the patient's general adaptive skills, social skills, or practical skills.    Impressions & Recommendations:  This patient generated a predominantly normal range Neuropsychological Evaluation with respect to neurocognitive functioning.  In this regard, the generated invalid profile raises concern for chronic underlying ADHD type issue that is more mild in terms of severity.  She has functional memory problems.  Furthermore, her performances across all other neurocognitive domains assessed are fully normal.  From an emotional standpoint, there is strong support for severe anxiety and, to a lesser degree, depression.  She can be submissive and conforming in her relationships and others may try to take advantage of her.  There is a mixed and complex substance use disorder here.       The generated neurocognitive test results are reassuring but the psychiatric/psychologic results are concerning.  The patient needs intensive psychiatric care to include medication management for anxiety and depression along with active engagement in intensive psychotherapy.  I strongly suggested inpatient, residential, or partial day programming for her.  It is important to note that she is highly motivated for treatment.   She has competency/capacity at this time.  Thus, though she is submissive and  conforming she has intact executive functions and is capable of making informed decisions for herself, even if those decisions are poor is viewed by an objective observer.  Consider treatment for attention deficit as well, but caution is advised in selecting an appropriate medication for attention for her, given the anxiety here.  With improvement in mood and addressing of her substance use disorder, her prognosis does improve.  Treatment is likely to be lengthy, arduous, difficult, with a higher probability of reversals.  Any impasse needs to be handled with particular care.     I will discuss these findings with the patient when she follows up with me in the near future.  A follow up Neuropsychological Evaluation is indicated on a prn basis, especially if there are any cognitive and/or emotional changes.      DIAGNOSES:   The Referral Diagnosis Of MCI - IS NOT SUPPORTED      Severe Anxiety      Mixed Substance Use Disorder      Major Depression      ADHD, Combined, Mild, Chronic       Alcoholic Peripheral Neuropathy          The above information is based upon information currently available to me.  If there is any additional information of which I am currently unaware, I would be more than happy to review it upon having it made available to me.  Thank you for the opportunity to see this interesting individual.     Sincerely,       Kealani Leckey A. Wynonia Hazard, PsyD, EdS    CC: None     Time Documentation:      [phone] x1 &  [phone] x 1 Neuropsych testing/data gathering by Neuropsychologist (60 minutes)     96138 x 1  96139 x 7 Test Administration/Data Gathering By Technician: (4 hours). 70350 x 1 (first 30 minutes), 96138 x 7 (each additional 30 minutes)    96132 x 1  96133 x 1 Testing Evaluation Services by Neuropsychologist (1 hour, 50 minutes) 96132 x 1 (first hour), 96133 x 1 (50 minutes)    Definitions:      96116/96121:  Neurobehavioral Status  Exam, Clinical interview.  Clinical assessment of thinking, reasoning and judgment, by neuropsychologist, both face to face time with patient and time interpreting those test results and reporting, first and subsequent hours)    96138/96139: Neuropsychological Test Administration by Technician/Psychometrist, first 30 minutes and each additional 30 minutes.     The above includes: Record review.  Review of history provided by patient.  Review of collaborative information.  Testing by Clinician.  Review of raw data. Scoring. Report writing of individual tests administered by Clinician.  Integration of individual tests administered by psychometrist with NSE/testing by clinician, review of records/history/collaborative information, case Conceptualization, treatment planning, clinical decision making, report writing, coordination Of Care. Psychometry test codes as time spent by psychometrist administering and scoring neurocognitive/psychological tests under supervision of neuropsychologist.  Integral services including scoring of raw data, data interpretation, case conceptualization, report writing etcetera were initiated after the patient finished testing/raw data collected and was completed on the date the report was signed.

## 2021-01-14 NOTE — Progress Notes (Signed)
Unable to complete testing on 01/11/21 due to  time. She will call us to let us know when she can come back to complete testing (about 30 minutes remaining). She can be put on my schedule any day or time before 3:30pm.

## 2021-01-14 NOTE — Progress Notes (Signed)
Formatting of this note might be different from the original.  Unable to complete testing on 01/11/21 due to  time. She will call us to let us know when she can come back to complete testing (about 30 minutes remaining). She can be put on my schedule any day or time before 3:30pm.  Electronically signed by Langston Reusing at 01/14/2021 10:42 AM EDT

## 2021-01-25 ENCOUNTER — Ambulatory Visit: Attending: Clinical Neuropsychologist | Primary: Internal Medicine

## 2021-01-25 NOTE — Progress Notes (Signed)
No show.  Patient was called multiple times to no avail.

## 2021-01-25 NOTE — Telephone Encounter (Signed)
Calling to reschedule her testing appt.

## 2021-02-04 ENCOUNTER — Encounter: Attending: Clinical Neuropsychologist | Primary: Internal Medicine

## 2021-02-22 NOTE — Telephone Encounter (Signed)
Pt mother calling to make aware that daughter is supposed to come back and wants to finish process with testing and such. Please call.

## 2021-02-26 NOTE — Telephone Encounter (Signed)
Pt requesting FU appt for test results. Please contact

## 2021-03-01 NOTE — Telephone Encounter (Signed)
Requesting an appt for second testing. Please contact

## 2021-03-05 ENCOUNTER — Encounter

## 2021-03-05 ENCOUNTER — Encounter: Payer: MEDICAID | Attending: Clinical Neuropsychologist

## 2021-03-05 NOTE — Telephone Encounter (Signed)
Patient is requesting a refill on Gabapentin. As of yesterday she only had 2 days left.

## 2021-03-05 NOTE — Telephone Encounter (Signed)
Pt mother called in and stated she was told pt should not have more testing to be done, please call. PHI verified.

## 2021-03-06 MED ORDER — GABAPENTIN 600 MG TAB
600 mg | ORAL_TABLET | ORAL | 3 refills | Status: DC
Start: 2021-03-06 — End: 2021-06-12

## 2021-03-07 ENCOUNTER — Encounter: Payer: MEDICAID | Attending: Clinical Neuropsychologist

## 2021-03-29 NOTE — Telephone Encounter (Signed)
Response received from Dr. Wynonia Hazard regarding rescheduling patient's appt. Did advise the patient of previous appts that was canceled and per Dr. Wynonia Hazard appts that are scheduled can't keep getting canceled.     Tried to phone patient but the patient wasn't not available. Phone Mom since she's on the PHI form to schedule appt for next available date and time. While speaking with the mom she will do her best to make sure the daughter is at the appt right now she's (patient) is currently homeless and is dealing with a lot and can't remember a lot of things. Patient has been scheduled. Reminder call will be 24 hr in advance to remind her of the appt

## 2021-03-29 NOTE — Telephone Encounter (Signed)
Patient calling to schedule follow up appt for test results    If cannot be reached than call this number - [phone]

## 2021-03-29 NOTE — Telephone Encounter (Signed)
Spoke with patient regarding follow up appointment request. Last 2 appointments was canceled as patient canceled. Message sent to Dr. Wynonia Singleton to advise about rescheduling.
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
## 2021-05-30 NOTE — Telephone Encounter (Signed)
 Patient is requesting a call back regarding her Gabapentin  prescription.

## 2021-05-30 NOTE — Telephone Encounter (Signed)
 Called pt. Verified. Inform pt that she has an appt with Dr. Graceann tomorrow 05/31/21 and she states that she will discuss her Gabapentin  concerns at appt.

## 2021-05-31 ENCOUNTER — Encounter: Attending: Neurology | Primary: Internal Medicine

## 2021-06-12 ENCOUNTER — Encounter

## 2021-06-12 NOTE — Telephone Encounter (Signed)
 Calling to [facility] ([address]) for gabapentin . States she had it transferred and is not able to get to that pharmacy and can't transfer it again.

## 2021-06-12 NOTE — Telephone Encounter (Signed)
Formatting of this note might be different from the original.  Calling to request new [facility] to [facility] ([address]) for gabapentin. States she had it transferred and is not able to get to that pharmacy and can't transfer it again.   Electronically signed by Elease Hashimoto at 06/12/2021  9:55 AM EDT

## 2021-06-13 MED ORDER — GABAPENTIN 600 MG TAB
600 mg | ORAL_TABLET | ORAL | 3 refills | Status: AC
Start: 2021-06-13 — End: ?

## 2021-06-18 ENCOUNTER — Ambulatory Visit: Admit: 2021-06-17 | Payer: MEDICAID | Attending: Clinical Neuropsychologist | Primary: Internal Medicine

## 2021-06-18 DIAGNOSIS — G3184 Mild cognitive impairment, so stated: Secondary | ICD-10-CM
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
## 2021-07-10 NOTE — Telephone Encounter (Signed)
Called [facility] and spoke with Altha Harm. She states Gabapentin can be filled and will be ready for pick up today after 6PM. Called pt not available spoke with Olin Hauser hipaa verified. Inform her that I [facility] at [facility] and that the Gabapentin will be ready for pick up after 6PM today. Olin Hauser verbalizes understanding.

## 2021-08-26 ENCOUNTER — Encounter: Admit: 2021-08-26 | Payer: MEDICAID | Attending: Neurology | Primary: Internal Medicine

## 2021-08-26 ENCOUNTER — Encounter: Attending: Neurology | Primary: Internal Medicine

## 2021-08-26 DIAGNOSIS — G621 Alcoholic polyneuropathy: Secondary | ICD-10-CM

## 2021-08-26 MED ORDER — GABAPENTIN 600 MG PO TABS
600 MG | ORAL_TABLET | ORAL | 5 refills | Status: AC
Start: 2021-08-26 — End: 2021-09-25

## 2021-08-26 NOTE — Progress Notes (Unsigned)
Neurology Progress Note    Patient ID:  Michelle Singleton  [id number]  36 y.o.  03/21/1986      Subjective:   History:  Michelle Singleton is a  female who has  Anxiety and alcohol abuse since teenage yrs with liver cirrhosis (Drinks 8-10 shots of liquor, 1 bottle of wine and 3-6 hard llemonades daily) who comes in with cognitive issues. MMSE is 29/30. Consideration includes mild cognitive impairment due to history of alcohol abuse and current hepatic encephalopathy. Patient also has bilateral lower extremity paresthesias, weakness and difficulty walking and balancing. Was seeing a neurologist at [facility] Dr Chales Abrahams and was placed on Gabapentin 500 mg BID and  Cymbalta 30 mg BID was added. Also on Thiamine 100 mg every day. 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     Since last time, patient went back to drinking unfortunately. Patient now 2 months and 8 days sober.      Needs to take Lactulose due to elevated ammonia for hepatic encephalopathy  Needs to go back to Thiamine     Still gets numbness of legs now on Pregabalin 25 mg BID with leg swelling. Previously Gabapentin 600 mg BID which worked the best. Cymbalta did not work.       Neuropsychological testing (06/17/21)  This patient generated a predominantly normal range Neuropsychological Evaluation with respect to neurocognitive functioning.  In this regard, the generated invalid profile raises concern for chronic underlying ADHD type issue that is more mild in terms of severity.  She has functional memory problems.  Furthermore, her performances across all other neurocognitive domains assessed are fully normal.  From an emotional standpoint, there is strong support for severe anxiety and, to a lesser degree, depression.  She can be submissive and conforming in her relationships and others may try to take advantage of her.  There is a mixed and complex substance use disorder here.                   The generated neurocognitive test  results are reassuring but the psychiatric/psychologic results are concerning.  The patient needs intensive psychiatric care to include medication management for anxiety and depression along with active engagement in intensive psychotherapy.  I strongly suggested inpatient, residential, or partial day programming for her.  It is important to note that she is highly motivated for treatment.  She has competency/capacity at this time.  Thus, though she is submissive and conforming she has intact executive functions and is capable of making informed decisions for herself, even if those decisions are poor is viewed by an objective observer.  Consider treatment for attention deficit as well, but caution is advised in selecting an appropriate medication for attention for her, given the anxiety here.  With improvement in mood and addressing of her substance use disorder, her prognosis does improve.  Treatment is likely to be lengthy, arduous, difficult, with a higher probability of reversals.  Any impasse needs to be handled with particular care.                 I will discuss these findings with the patient when she follows up with me in the near future.  A follow up Neuropsychological Evaluation is indicated on a prn basis, especially if there are any cognitive and/or emotional changes.       DIAGNOSES:  The Referral Diagnosis Of MCI - IS NOT SUPPORTED                                                  Severe Anxiety                                                  Mixed Substance Use Disorder                                                  Major Depression                                                  ADHD, Combined, Mild, Chronic                                                                                Alcoholic Peripheral Neuropathy          ROS:  Per HPI-  Otherwise the remainder of ROS was negative    Social Hx  Social History     Socioeconomic History    Marital status: Single   Tobacco  Use    Smoking status: Every Day     Packs/day: 0.50     Types: Cigarettes    Smokeless tobacco: Never   Substance and Sexual Activity    Alcohol use: Yes    Drug use: Not Currently       Meds:  Current Outpatient Medications on File Prior to Visit   Medication Sig Dispense Refill    gabapentin (NEURONTIN) 600 MG tablet TAKE 1 TABLET BY MOUTH TWO (2) TIMES A DAY. MAX DAILY AMOUNT: 2 TABS      lactulose (CHRONULAC) 10 GM/15ML solution TAKE BY MOUTH TWICE A DAY      loratadine (CLARITIN) 10 MG tablet Take 1 tablet by mouth      rifAXIMin (XIFAXAN) 200 MG tablet Take 550 mg by mouth 2 times daily      thiamine 100 MG tablet Take 1 tablet by mouth daily       No current facility-administered medications on file prior to visit.       Imaging:    CT Results (recent):  @BSHSILASTIMGCAT (MVH8469:6)@    MRI Results (recent):  @BSHSILASTIMGCAT (EXB2841:3)@    IR Results (recent):  @BSHSILASTIMGCAT (KGM0102:7)@    VAS/US Results (recent):  @BSHSILASTIMGCAT (OZD6644:0)@    Reviewed records in [facility] and media tab today    Lab Review     No visits with results within 3 Month(s) from this visit.   Latest known visit with results is:   No results found for any previous visit.  Objective:       Exam:  There were no vitals filed for this visit.  Gen: Awake, alert, follows commands  Appropriate appearance, normal speech.  Oriented to all spheres.  No visual field defect on confrontation exam.  Full eyes movement, with no nystagmus, no diplopia, no ptosis.  Normal gag and swallow.  All remaining cranial nerves were normal  Motor function: 5/5 in all extremities  Sensory: intact to LT, PP and JPS  DTRs ++ in all extremities, (-) Babinski  Good FTN and HTS   Gait: Normal    Assessment:     No diagnosis found.      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        MRI brain (06/29/19): Non-specific white matter  changes      Plan:   1.  2.  3.    1. Patient already quit drinking and has been sober for 2 months and 8 days c/o alcohol program  2. Will switch back to Gabapentin 600 mg BID which worked in the past for leg symptoms (previous alcohol program will not allow Gabapentin, but current one will)  3. Advise to go back on Thiamine 100 mg every day   4. Patient wants to go back to work. Will refer to Dr Wynonia Hazard for neuropsychological testing   5. Follow up with GI regarding hepatic encephalopathy (also wants to see a local GI)      No follow-ups on file.           Roxy Manns, MD  Diplomate, American Board of Psychiatry and Neurology  Diplomate, Neuromuscular Medicine  Diplomate, American Board of Electrodiagnostic Medicine

## 2021-11-08 ENCOUNTER — Inpatient Hospital Stay
Admit: 2021-11-08 | Discharge: 2021-11-12 | Disposition: A | Discharge status: Home / Self Care | Payer: MEDICAID | Admitting: Family Medicine

## 2021-11-08 ENCOUNTER — Emergency Department: Payer: MEDICAID | Primary: Internal Medicine

## 2021-11-08 ENCOUNTER — Emergency Department: Admit: 2021-11-08 | Payer: MEDICAID | Primary: Internal Medicine

## 2021-11-08 ENCOUNTER — Encounter (INDEPENDENT_AMBULATORY_CARE_PROVIDER_SITE_OTHER): Payer: Self-pay | Admitting: Internal Medicine

## 2021-11-08 DIAGNOSIS — M71122 Other infective bursitis, left elbow: Principal | ICD-10-CM

## 2021-11-08 LAB — CBC WITH AUTO DIFFERENTIAL
Absolute Immature Granulocyte: 0 10*3/uL
Basophils %: 1 % (ref 0–1)
Basophils Absolute: 0.1 10*3/uL (ref 0.0–0.1)
Eosinophils %: 0 % (ref 0–7)
Eosinophils Absolute: 0 10*3/uL (ref 0.0–0.4)
Hematocrit: 39.8 % (ref 35.0–47.0)
Hemoglobin: 13 g/dL (ref 11.5–16.0)
Immature Granulocytes: 0 %
Lymphocytes %: 22 % (ref 12–49)
Lymphocytes Absolute: 2.3 10*3/uL (ref 0.8–3.5)
MCH: 31.3 PG (ref 26.0–34.0)
MCHC: 32.7 g/dL (ref 30.0–36.5)
MCV: 95.7 FL (ref 80.0–99.0)
MPV: 10.7 FL (ref 8.9–12.9)
Monocytes %: 9 % (ref 5–13)
Monocytes Absolute: 0.9 10*3/uL (ref 0.0–1.0)
Neutrophils %: 68 % (ref 32–75)
Neutrophils Absolute: 7.1 10*3/uL (ref 1.8–8.0)
Nucleated RBCs: 0 PER 100 WBC
Platelets: 141 10*3/uL — ABNORMAL LOW (ref 150–400)
RBC: 4.16 M/uL (ref 3.80–5.20)
RDW: 14 % (ref 11.5–14.5)
WBC: 10.4 10*3/uL (ref 3.6–11.0)
nRBC: 0 10*3/uL (ref 0.00–0.01)

## 2021-11-08 LAB — MAGNESIUM: Magnesium: 2.1 mg/dL (ref 1.6–2.4)

## 2021-11-08 LAB — COMPREHENSIVE METABOLIC PANEL
ALT: 28 U/L (ref 12–78)
AST: 21 U/L (ref 15–37)
Albumin/Globulin Ratio: 0.8 — ABNORMAL LOW (ref 1.1–2.2)
Albumin: 3.1 g/dL — ABNORMAL LOW (ref 3.5–5.0)
Alk Phosphatase: 80 U/L (ref 45–117)
Anion Gap: 8 mmol/L (ref 5–15)
BUN: 9 MG/DL (ref 6–20)
Bun/Cre Ratio: 12 (ref 12–20)
CO2: 22 mmol/L (ref 21–32)
Calcium: 8.7 MG/DL (ref 8.5–10.1)
Chloride: 105 mmol/L (ref 97–108)
Creatinine: 0.76 MG/DL (ref 0.55–1.02)
Est, Glom Filt Rate: >60 mL/min/{1.73_m2} (ref >60)
Globulin: 4.1 g/dL — ABNORMAL HIGH (ref 2.0–4.0)
Glucose: 95 mg/dL (ref 65–100)
Potassium: 3.3 mmol/L — ABNORMAL LOW (ref 3.5–5.1)
Sodium: 135 mmol/L — ABNORMAL LOW (ref 136–145)
Total Bilirubin: 0.5 MG/DL (ref 0.2–1.0)
Total Protein: 7.2 g/dL (ref 6.4–8.2)

## 2021-11-08 LAB — EXTRA TUBES HOLD

## 2021-11-08 LAB — LACTIC ACID: Lactic Acid, Plasma: 1.5 MMOL/L (ref 0.4–2.0)

## 2021-11-08 MED ORDER — CETIRIZINE HCL 10 MG PO TABS
10 MG | Freq: Every day | ORAL | Status: AC
Start: 2021-11-08 — End: 2021-11-12
  Administered 2021-11-08 – 2021-11-12 (×5): 10 mg via ORAL

## 2021-11-08 MED ORDER — POTASSIUM CHLORIDE ER 10 MEQ PO TBCR
10 MEQ | ORAL | Status: AC
Start: 2021-11-08 — End: 2021-11-08
  Administered 2021-11-08 – 2021-11-09 (×2): 40 meq via ORAL

## 2021-11-08 MED ORDER — VANCOMYCIN HCL 1 G IV SOLR
1 g | Freq: Two times a day (BID) | INTRAVENOUS | Status: AC
Start: 2021-11-08 — End: 2021-11-11
  Administered 2021-11-09 – 2021-11-11 (×5): 1000 mg via INTRAVENOUS

## 2021-11-08 MED ORDER — NORMAL SALINE FLUSH 0.9 % IV SOLN
0.9 % | Freq: Two times a day (BID) | INTRAVENOUS | Status: AC
Start: 2021-11-08 — End: 2021-11-12
  Administered 2021-11-09 – 2021-11-12 (×6): 10 mL via INTRAVENOUS

## 2021-11-08 MED ORDER — TETANUS-DIPHTHERIA TOXOIDS TD 5-2 LFU IM INJ
5-2 LFU | Freq: Once | INTRAMUSCULAR | Status: AC
Start: 2021-11-08 — End: 2021-11-12

## 2021-11-08 MED ORDER — GABAPENTIN 600 MG PO TABS
600 MG | Freq: Two times a day (BID) | ORAL | Status: AC
Start: 2021-11-08 — End: 2021-11-12
  Administered 2021-11-09 – 2021-11-12 (×7): 600 mg via ORAL

## 2021-11-08 MED ORDER — SODIUM CHLORIDE 0.9 % IV SOLN (MINI-BAG)
0.9 % | Freq: Once | INTRAVENOUS | Status: AC
Start: 2021-11-08 — End: 2021-11-08
  Administered 2021-11-08: 22:00:00 4500 mg via INTRAVENOUS

## 2021-11-08 MED ORDER — SODIUM CHLORIDE 0.9 % IV SOLN
0.9 % | INTRAVENOUS | Status: AC | PRN
Start: 2021-11-08 — End: 2021-11-12

## 2021-11-08 MED ORDER — THIAMINE HCL 100 MG PO TABS
100 MG | Freq: Every day | ORAL | Status: AC
Start: 2021-11-08 — End: 2021-11-12
  Administered 2021-11-08 – 2021-11-12 (×5): 100 mg via ORAL

## 2021-11-08 MED ORDER — SODIUM CHLORIDE 0.9 % IV SOLN (MINI-BAG)
0.9 % | Freq: Three times a day (TID) | INTRAVENOUS | Status: DC
Start: 2021-11-08 — End: 2021-11-08

## 2021-11-08 MED ORDER — ONDANSETRON 4 MG PO TBDP
4 MG | Freq: Three times a day (TID) | ORAL | Status: DC | PRN
Start: 2021-11-08 — End: 2021-11-12

## 2021-11-08 MED ORDER — ONDANSETRON HCL 4 MG/2ML IJ SOLN
4 MG/2ML | Freq: Four times a day (QID) | INTRAMUSCULAR | Status: AC | PRN
Start: 2021-11-08 — End: 2021-11-12

## 2021-11-08 MED ORDER — PIPERACILLIN SOD-TAZOBACTAM SO 3.375 (3-0.375) G IV SOLR
3.375 (3-0.375) g | Freq: Three times a day (TID) | INTRAVENOUS | Status: AC
Start: 2021-11-08 — End: 2021-11-15
  Administered 2021-11-09 – 2021-11-11 (×7): 3375 mg via INTRAVENOUS

## 2021-11-08 MED ORDER — POLYETHYLENE GLYCOL 3350 17 G PO PACK
17 g | Freq: Every day | ORAL | Status: AC | PRN
Start: 2021-11-08 — End: 2021-11-12

## 2021-11-08 MED ORDER — RIFAXIMIN 200 MG PO TABS
200 MG | Freq: Three times a day (TID) | ORAL | Status: AC
Start: 2021-11-08 — End: 2021-11-12
  Administered 2021-11-09 – 2021-11-12 (×9): 400 mg via ORAL

## 2021-11-08 MED ORDER — IOPAMIDOL 61 % IV SOLN
61 % | Freq: Once | INTRAVENOUS | Status: AC | PRN
Start: 2021-11-08 — End: 2021-11-08
  Administered 2021-11-08: 19:00:00 100 mL via INTRAVENOUS

## 2021-11-08 MED ORDER — ACETAMINOPHEN 650 MG RE SUPP
650 | Freq: Four times a day (QID) | RECTAL | Status: DC | PRN
Start: 2021-11-08 — End: 2021-11-12

## 2021-11-08 MED ORDER — NICOTINE 21 MG/24HR TD PT24
21 MG/24HR | Freq: Every day | TRANSDERMAL | Status: AC
Start: 2021-11-08 — End: 2021-11-12
  Administered 2021-11-09 – 2021-11-12 (×4): 1 via TRANSDERMAL

## 2021-11-08 MED ORDER — SODIUM CHLORIDE 0.9 % IV SOLN
0.9 % | INTRAVENOUS | Status: DC
Start: 2021-11-08 — End: 2021-11-08

## 2021-11-08 MED ORDER — VANCOMYCIN INTERMITTENT DOSING (PLACEHOLDER)
INTRAVENOUS | Status: DC
Start: 2021-11-08 — End: 2021-11-11

## 2021-11-08 MED ORDER — ACETAMINOPHEN 325 MG PO TABS
325 | Freq: Four times a day (QID) | ORAL | Status: DC | PRN
Start: 2021-11-08 — End: 2021-11-12
  Administered 2021-11-09 (×2): 650 mg via ORAL

## 2021-11-08 MED ORDER — VIAL2BAG ADAPTOR (20 MM)
1.25 g | INTRAVENOUS | Status: AC
Start: 2021-11-08 — End: 2021-11-09

## 2021-11-08 MED ORDER — LACTULOSE 10 GM/15ML PO SOLN
10 GM/15ML | Freq: Two times a day (BID) | ORAL | Status: AC
Start: 2021-11-08 — End: 2021-11-12
  Administered 2021-11-09: 14:00:00 20 g via ORAL

## 2021-11-08 MED ORDER — NORMAL SALINE FLUSH 0.9 % IV SOLN
0.9 % | INTRAVENOUS | Status: AC | PRN
Start: 2021-11-08 — End: 2021-11-12

## 2021-11-08 MED FILL — MONOJECT FLUSH SYRINGE 0.9 % IV SOLN: 0.9 % | INTRAVENOUS | Qty: 40

## 2021-11-08 MED FILL — ACETAMINOPHEN 325 MG PO TABS: 325 MG | ORAL | Qty: 2

## 2021-11-08 MED FILL — K-TAB 10 MEQ PO TBCR: 10 MEQ | ORAL | Qty: 4

## 2021-11-08 MED FILL — ISOVUE-300 61 % IV SOLN: 61 % | INTRAVENOUS | Qty: 100

## 2021-11-08 MED FILL — XIFAXAN 200 MG PO TABS: 200 MG | ORAL | Qty: 2

## 2021-11-08 MED FILL — CETIRIZINE HCL 10 MG PO TABS: 10 MG | ORAL | Qty: 1

## 2021-11-08 MED FILL — TENIVAC 5-2 LFU IM INJ: 5-2 LFU | INTRAMUSCULAR | Qty: 0.5

## 2021-11-08 MED FILL — PIPERACILLIN SOD-TAZOBACTAM SO 4.5 (4-0.5) G IV SOLR: 4.5 (4-0.5) g | INTRAVENOUS | Qty: 4500

## 2021-11-08 MED FILL — THIAMINE HCL 100 MG PO TABS: 100 MG | ORAL | Qty: 1

## 2021-11-08 NOTE — Progress Notes (Signed)
Pharmacist Note - Vancomycin Dosing    Consult provided for this 36 y.o. female for indication of bursitis of elbow.  Antibiotic regimen(s): vancomycin + Zosyn  Patient on vancomycin PTA? NO     Recent Labs     11/08/21  1301   WBC 10.4   CREATININE 0.76   BUN 9     Frequency of BMP: daily  Height: 162.6 cm  Weight: 56.9 kg  Est CrCl: 88 ml/min; UO: -- ml/kg/hr  Temp (24hrs), Avg:98 F (36.7 C), Min:98 F (36.7 C), Max:98 F (36.7 C)    Cultures:  8/18 blood, pending    MRSA Swab ordered (if applicable)? N/A    The plan below is expected to result in a target range of AUC/MIC 400-600    Therapy will be initiated with a loading dose of 1250 mg IV x 1 to be followed by a maintenance dose of 1000 mg IV every 12 hours.  Pharmacy to follow patient daily and order levels / make dose adjustments as appropriate.        *Vancomycin has been dosed used Bayesian kinetics software to target an AUC/MIC of 400-600, which provides adequate exposure for an assumed infection due to MRSA with an MIC of 1 or less while reducing the risk of nephrotoxicity as seen with traditional trough based dosing goals.

## 2021-11-08 NOTE — H&P (Signed)
History and Physical    Date of Service:  11/08/2021  Primary Care Provider: Mariann Laster, MD  Source of information: The patient, Chart review, and Spouse/family member    Chief Complaint: Arm Injury      History of Presenting Illness:   Michelle Singleton is a 36 y.o. female with liver cirrhosis, alcohol abuse, nicotine dependence, anxiety, neuropathy and HTN who presented to the ED with a swollen and painful L elbow since [place] on Monday 8/14. She went to urgent care about 2 days ago, had an xray which reportedly said there was a fracture and she was given a script for cephalexin but was unable to pick it up yesterday as [facility] had closed. She went to [place] as it was not getting better and was advised to come to the ER. The L elbow is red, swollen, and draining yellow fluid and pus at the laceration site. She denies f/c/v /diarrhea/constipation/dysuria, CP, and SOB but has had some nausea. She has not taken any abx for this.    VS were stable in the ED. Lactic acid and WBC count were normal. CT L elbow showed olecranon bursitis with cellulitis but no evidence of septic arthritis or acute osteomyelitis.      REVIEW OF SYSTEMS:  A comprehensive review of systems was negative except for that written in the History of Present Illness.     Past Medical History:   Diagnosis Date    Hypertension     Liver disease       No past surgical history on file.  Prior to Admission medications    Medication Sig Start Date End Date Taking? Authorizing Provider   gabapentin (NEURONTIN) 600 MG tablet TAKE 1 TABLET BY MOUTH TWO (2) TIMES A DAY. MAX DAILY AMOUNT: 2 TABS 08/26/21 09/25/21  Melford Aase, MD   lactulose (CHRONULAC) 10 GM/15ML solution TAKE BY MOUTH TWICE A DAY 05/30/20   Ar Automatic Reconciliation   loratadine (CLARITIN) 10 MG tablet Take 1 tablet by mouth    Ar Automatic Reconciliation   rifAXIMin (XIFAXAN) 200 MG tablet Take 550 mg by mouth 2 times daily    Ar Automatic  Reconciliation   thiamine 100 MG tablet Take 1 tablet by mouth daily    Ar Automatic Reconciliation     No Known Allergies   No family history on file.   Social History:  reports that she has been smoking cigarettes. She has been smoking an average of .5 packs per day. She has been exposed to tobacco smoke. She has never used smokeless tobacco. She reports current alcohol use. She reports that she does not currently use drugs.   Social Determinants of Health     Tobacco Use: High Risk    Smoking Tobacco Use: Every Day    Smokeless Tobacco Use: Never    Passive Exposure: Current   Alcohol Use: Not on file   Financial Resource Strain: Not on file   Food Insecurity: Not on file   Transportation Needs: Not on file   Physical Activity: Not on file   Stress: Not on file   Social Connections: Not on file   Intimate Partner Violence: Not on file   Depression: Not on file   Housing Stability: Not on file        Medications were reconciled to the best of my ability given all available resources at the time of admission. Route is PO if not otherwise  noted.     Family and social history were personally reviewed, all pertinent and relevant details are outlined as above.    Objective:   BP 125/75   Pulse 97   Temp 98 F (36.7 C) (Temporal)   Resp 16   Wt 56.9 kg (125 lb 7.1 oz)   SpO2 98%   BMI 21.53 kg/m         PHYSICAL EXAM:   General: awake, NAD, anxious-appearing and restless with pressured speech  HEENT: NCAT, PERRL, EOMI, moist mucus membranes  Neck: Supple, no masses  Chest: Clear to auscultation bilaterally   CVS: regular rhythm, normal rate, no m/r/g appreciated, no BLE edema  Abd: +BS, soft, NT, ND  MSK: MAE with reduced ROM L elbow  Neuro/Psych: Pleasant mood and affect, Aox3, responds appropriately to questions and commands  Skin: Warm, dry, L elbow erythema, edema, TTP with small laceration oozing seropurulent fluid    Data Review:   I have independently reviewed and interpreted patient's lab and all other  diagnostic data    Abnormal Labs Reviewed   CBC WITH AUTO DIFFERENTIAL - Abnormal; Notable for the following components:       Result Value    Platelets 141 (*)     All other components within normal limits   COMPREHENSIVE METABOLIC PANEL - Abnormal; Notable for the following components:    Sodium 135 (*)     Potassium 3.3 (*)     Albumin 3.1 (*)     Globulin 4.1 (*)     Albumin/Globulin Ratio 0.8 (*)     All other components within normal limits     IMAGING:   CT ELBOW LEFT W CONTRAST   Final Result   Olecranon bursitis (possibly septic given the provided clinical indication) with   associated cellulitis. No evidence of septic arthritis nor acute osteomyelitis.               Notes reviewed from all clinical/nonclinical/nursing services involved in patient's clinical care. Care coordination discussions were held with appropriate clinical/nonclinical/ nursing providers based on care coordination needs.     Assessment/Plan:   Given the patient's current clinical presentation, there is a high level of concern for decompensation if discharged from the emergency department. Complex decision making was performed, which includes reviewing the patient's available past medical records, laboratory results, and imaging studies.    L olecranon bursitis with cellulitis and laceration s/p fall in the river  Admit inpatient medical floor  No signs of sepsis  CT L elbow 8/17 olecranon bursitis with cellulitis, no e/o septic arthritis or acute osteomyelitis  L elbow laceration with seropurulent drainage   Consult Ortho to eval for possible I&D  Check MRSA swab  Check wound cx  Bcx 8/17 NGTD x2  Given fall in the river and inability to clean out the laceration immediately, will start empiric vanc/Zosyn and de-escalate as indicated  OT eval    Hypokalemia  Replete and check magnesium    HTN  Controlled on home medication    Liver cirrhosis with h/o hepatic encephalopathy  No e/o HE at this time  Resume home rifaximin, lactulose,  thiamine    Alcohol abuse  Cessation counseling strongly advised  Watch for alcohol withdrawal    Anxiety  No anxiolytic on PTA med list    Neuropathy  Continue home gabapentin    Nicotine dependence  Cessation counseling  Start nicotine patch    DIET: ADULT DIET; Regular; No Added Salt (3-4 gm)  ISOLATION PRECAUTIONS: No active isolations  CODE STATUS: Full Code   DVT PROPHYLAXIS: SCDs  FUNCTIONAL STATUS PRIOR TO HOSPITALIZATION: Fully active and ambulatory; able to carry on all self-care without restriction.  Ambulatory status/function: By self   EARLY MOBILITY ASSESSMENT: Recommend an assessment from physical therapy and/or occupational therapy  ANTICIPATED DISCHARGE: 24-48 hours.  ANTICIPATED DISPOSITION: Home  EMERGENCY CONTACT/SURROGATE DECISION MAKER: mother Rinaldo Cloud      Signed By: Delon Sacramento, MD     November 08, 2021         Please note that this dictation may have been completed with Reubin Milan, the computer voice recognition software.  Quite often unanticipated grammatical, syntax, homophones, and other interpretive errors are inadvertently transcribed by the computer software.  Please disregard these errors.  Please excuse any errors that have escaped final proofreading.

## 2021-11-08 NOTE — ED Provider Notes (Signed)
[facility] EMERGENCY DEP  EMERGENCY DEPARTMENT ENCOUNTER      Pt Name: Michelle Singleton  MRN: [id number]  Birthdate 06/01/1988  Date of evaluation: 11/08/2024  Provider: Tona Sensing, PA-C    CHIEF COMPLAINT       Chief Complaint   Patient presents with    Arm Injury         HISTORY OF PRESENT ILLNESS   (Location/Symptom, Timing/Onset, Context/Setting, Quality, Duration, Modifying Factors, Severity)  Note limiting factors.   With past medical history of cirrhosis, hepatic encephalopathy, hypertension presenting to the emergency department for evaluation of laceration and skin infection.  Patient states that approximately 5 days ago she [place] and lacerated the posterior aspect of her left elbow.  She states that since sustaining the injury she has had pain and swelling that has been gradually worsening so she presented for evaluation to urgent care yesterday.  Urgent care performed x-rays demonstrate acute fracture and discharged her home with antibiotic, Keflex.  However patient states that she is unable to the pharmacy for a closed last night so she has not started the antibiotic.  She states that the pain and swelling has continued to worsen so she presents to the ER today.  She denies any associated fever but states that she has pain of the elbow and tingling in the soft tissue around the elbow.  She additionally states that there is yellow drainage from the wound as well.  She denies any pain in the left shoulder and the left wrist. She denies any other injury in the fall.     The history is provided by the patient. No language interpreter was used.       Review of External Medical Records:     Nursing Notes were reviewed.    REVIEW OF SYSTEMS    (2-9 systems for level 4, 10 or more for level 5)     Review of Systems   Constitutional:  Negative for fever.   Musculoskeletal:  Positive for arthralgias (L elbow) and joint swelling (L elbow).   Skin:  Positive for wound.     Except as  noted above the remainder of the review of systems was reviewed and negative.       PAST MEDICAL HISTORY     Past Medical History:   Diagnosis Date    Hypertension     Liver disease          SURGICAL HISTORY     No past surgical history on file.      CURRENT MEDICATIONS       Previous Medications    GABAPENTIN (NEURONTIN) 600 MG TABLET    TAKE 1 TABLET BY MOUTH TWO (2) TIMES A DAY. MAX DAILY AMOUNT: 2 TABS    LACTULOSE (CHRONULAC) 10 GM/15ML SOLUTION    TAKE BY MOUTH TWICE A DAY    LORATADINE (CLARITIN) 10 MG TABLET    Take 1 tablet by mouth    RIFAXIMIN (XIFAXAN) 200 MG TABLET    Take 550 mg by mouth 2 times daily    THIAMINE 100 MG TABLET    Take 1 tablet by mouth daily       ALLERGIES     Patient has no known allergies.    FAMILY HISTORY     No family history on file.       SOCIAL HISTORY       Social History     Socioeconomic History  Marital status: Single   Tobacco Use    Smoking status: Every Day     Packs/day: 0.50     Types: Cigarettes     Passive exposure: Current    Smokeless tobacco: Never   Substance and Sexual Activity    Alcohol use: Yes    Drug use: Not Currently           PHYSICAL EXAM    (up to 7 for level 4, 8 or more for level 5)     ED Triage Vitals [11/08/21 1239]   BP Temp Temp Source Pulse Respirations SpO2 Height Weight - Scale   125/75 98 F (36.7 C) Temporal 97 16 98 % -- 125 lb 7.1 oz (56.9 kg)       Body mass index is 21.53 kg/m.    Physical Exam  Vitals and nursing note reviewed.   Constitutional:       General: She is not in acute distress.     Comments: Pt evaluated in triage chair. S/O at chairside. Pt in NAD. Holding L elbow in flexion.    HENT:      Head: Normocephalic and atraumatic.      Nose: Nose normal.      Mouth/Throat:      Mouth: Mucous membranes are moist.   Eyes:      Extraocular Movements: Extraocular movements intact.      Pupils: Pupils are equal, round, and reactive to light.   Cardiovascular:      Rate and Rhythm: Normal rate and regular rhythm.       Pulses: Normal pulses.      Heart sounds: Normal heart sounds. No murmur heard.  Pulmonary:      Effort: Pulmonary effort is normal. No respiratory distress.      Breath sounds: Normal breath sounds.   Musculoskeletal:         General: Swelling and tenderness present. No deformity.      Comments: Pt noted to have tenderness to palpation of the L elbow diffusely around the joint. No TTP to the L shoulder or L wrist. Pt is holding the L elbow in flexion and not moving the elbow secondary to pain.    Skin:     General: Skin is warm and dry.      Comments: To the posterior aspect of the L elbow, pt is noted to have a 2cm laceration that is draining yellow drainage. The area around the laceration is warm to the touch, edematous, erythematous and indurated. No obvious fluctuance.    Neurological:      General: No focal deficit present.      Mental Status: She is alert and oriented to person, place, and time.      Cranial Nerves: No cranial nerve deficit.   Psychiatric:         Mood and Affect: Mood normal.         Behavior: Behavior normal.       DIAGNOSTIC RESULTS     EKG: All EKG's are interpreted by the Emergency Department Physician who either signs or Co-signs this chart in the absence of a cardiologist.        RADIOLOGY:   Non-plain film images such as CT, Ultrasound and MRI are read by the radiologist. Plain radiographic images are visualized and preliminarily interpreted by the emergency physician with the below findings:        Interpretation per the Radiologist below, if available at the time of this note:  CT ELBOW LEFT W CONTRAST   Final Result   Olecranon bursitis (possibly septic given the provided clinical indication) with   associated cellulitis. No evidence of septic arthritis nor acute osteomyelitis.               LABS:  Labs Reviewed   CBC WITH AUTO DIFFERENTIAL - Abnormal; Notable for the following components:       Result Value    Platelets 141 (*)     All other components within normal limits    COMPREHENSIVE METABOLIC PANEL - Abnormal; Notable for the following components:    Sodium 135 (*)     Potassium 3.3 (*)     Albumin 3.1 (*)     Globulin 4.1 (*)     Albumin/Globulin Ratio 0.8 (*)     All other components within normal limits   CULTURE, BLOOD 1   CULTURE, BLOOD 2   LACTIC ACID   EXTRA TUBES HOLD   LACTIC ACID       All other labs were within normal range or not returned as of this dictation.    EMERGENCY DEPARTMENT COURSE and DIFFERENTIAL DIAGNOSIS/MDM:   Vitals:    Vitals:    11/08/21 1239   BP: 125/75   Pulse: 97   Resp: 16   Temp: 98 F (36.7 C)   TempSrc: Temporal   SpO2: 98%   Weight: 56.9 kg (125 lb 7.1 oz)           Medical Decision Making  36 year old female with no pertinent past medical history presenting to the emergency department for evaluation of arm injury.  She states that approximately 4 days ago [place] and [place] a laceration to the posterior aspect of her left elbow.  She did not seek treatment for this injury.  She states that the wound continued to grow more sore and red as the week progressed.  Yesterday she presented to urgent care who performed an x-ray negative for acute fracture and discharged her on prescription Keflex.  However she states that the pharmacy closed before she did pick up her prescription so she saw that started antibiotics.  She presents today because the elbow has become recently more painful and swollen.  On exam patient is noted to be holding her left elbow in flexion and not moving the elbow secondary to pain.  There is a 2 cm laceration to the posterior aspect of her left elbow that is not bleeding however does have a significant amount of yellow drainage coming from the site.  The area around the laceration is noted to be erythematous, edematous, warm to the touch and indurated on the lateral aspect of the wound.  There is no fluctuance.  She is afebrile and denies fever at home.  She denies left shoulder or left  wrist pain.  We will obtain CBC, CMP, blood cultures, lactic acid and a CT with contrast of the left elbow to assess for abscess, septic joint, other complications of untreated laceration exposed to fresh/salt water.  Ddx: septic arthritis, abscess, cellulitis, bursitis     CBC resulted and without evidence of acute leukocytosis.  CMP without significant electrolyte abnormality.  Lactic acid within normal limits.  CT with findings of olecranon bursitis with surrounding cellulitis. Bursitis is likely septic giiven clinical scenario. Will consult hospitalist team for admission for IV antibiotics and inpatient management.     Amount and/or Complexity of Data Reviewed  Labs: ordered. Decision-making details  documented in ED Course.  Radiology: ordered. Decision-making details documented in ED Course.    Risk  Prescription drug management.  Decision regarding hospitalization.            REASSESSMENT     ED Course as of 11/08/21 1555   Fri Nov 08, 2021   1400 CBC without leukocytosis [JH]   1400 Lactic acid wnl  [JH]   1547 CT elbow with the following findings:    Soft tissues: 3.5 cm x 2.9 cm x 0.8 cm rim-enhancing fluid collection overlying  the olecranon (4-44, 601-9), with extensive surrounding fat  stranding/inflammatory change and overlying skin thickening.     IMPRESSION:  Olecranon bursitis (possibly septic given the provided clinical indication) with  associated cellulitis. No evidence of septic arthritis nor acute osteomyelitis. [JH]   1547 Will seek admission for IV antibiotics, as this olecranon bursitis is likely septic.  [JH]      ED Course User Index  [JH] Tona Sensing, PA-C           CONSULTS:  None    Perfect Serve Consult for Admission  3:55 PM    ED Room Number: R36/R36  Patient Name and age:  Angelly Spearing 36 y.o.  female  Working Diagnosis:   1. Septic bursitis of elbow, left    2. Laceration of left upper extremity, initial encounter    3. Cellulitis of left upper extremity        COVID-19 Suspicion:  No  Sepsis present:  No  Reassessment needed: No  Code Status:  Full Code  Readmission: No  Isolation Requirements: no  Recommended Level of Care: med/surg  Department: [facility] Adult ED - [phone]  Consulting Provider: Dr. Larey Dresser    Other: 36 year old female with past medical history of liver cirrhosis and hepatic encephalopathy presenting for evaluation of left elbow injury. [place] and lacerated left elbow on the rocks. Exposed to water. Did not seek treatment after this injury. Grew increasingly painful over the week so presented today. The elbow is erythematous, edematous with induration over the medial aspect. CT of the elbow showing olecranon bursitis that is likely septic given clinical presentation. No leukocytosis. Afebrile. Lactic acid WNL.         PROCEDURES:  Unless otherwise noted below, none     Procedures      FINAL IMPRESSION      1. Septic bursitis of elbow, left    2. Laceration of left upper extremity, initial encounter    3. Cellulitis of left upper extremity          DISPOSITION/PLAN   DISPOSITION Decision To Admit 11/08/2021 03:48:08 PM      PATIENT REFERRED TO:  No follow-up provider specified.    DISCHARGE MEDICATIONS:  New Prescriptions    No medications on file         (Please note that portions of this note were completed with a voice recognition program.  Efforts were made to edit the dictations but occasionally words are mis-transcribed.)    Tona Sensing, PA-C (electronically signed)  Emergency Attending Physician / Physician Assistant / Nurse Practitioner             Tona Sensing, PA-C  11/08/21 1555

## 2021-11-08 NOTE — ED Triage Notes (Signed)
[place] and cut her left elbow. The elbow is now swollen and leaking yellow fluid.

## 2021-11-09 DIAGNOSIS — M71122 Other infective bursitis, left elbow: Secondary | ICD-10-CM

## 2021-11-09 LAB — CRYSTALS, BODY FLUID: Crystals, Fluid: NONE SEEN

## 2021-11-09 LAB — CBC WITH AUTO DIFFERENTIAL
Absolute Immature Granulocyte: 0 10*3/uL
Basophils %: 1 % (ref 0–1)
Basophils Absolute: 0.1 10*3/uL (ref 0.0–0.1)
Eosinophils %: 0 % (ref 0–7)
Eosinophils Absolute: 0 10*3/uL (ref 0.0–0.4)
Hematocrit: 43.7 % (ref 35.0–47.0)
Hemoglobin: 14.1 g/dL (ref 11.5–16.0)
Immature Granulocytes: 0 %
Lymphocytes %: 23 % (ref 12–49)
Lymphocytes Absolute: 1.9 10*3/uL (ref 0.8–3.5)
MCH: 31.3 PG (ref 26.0–34.0)
MCHC: 32.3 g/dL (ref 30.0–36.5)
MCV: 97.1 FL (ref 80.0–99.0)
MPV: 11.3 FL (ref 8.9–12.9)
Metamyelocytes: 1 % — ABNORMAL HIGH
Monocytes %: 4 % — ABNORMAL LOW (ref 5–13)
Monocytes Absolute: 0.3 10*3/uL (ref 0.0–1.0)
Neutrophils %: 71 % (ref 32–75)
Neutrophils Absolute: 5.8 10*3/uL (ref 1.8–8.0)
Nucleated RBCs: 0 PER 100 WBC
Platelets: 133 10*3/uL — ABNORMAL LOW (ref 150–400)
RBC: 4.5 M/uL (ref 3.80–5.20)
RDW: 14 % (ref 11.5–14.5)
WBC: 8.2 10*3/uL (ref 3.6–11.0)
nRBC: 0 10*3/uL (ref 0.00–0.01)

## 2021-11-09 LAB — BASIC METABOLIC PANEL
Anion Gap: 6 mmol/L (ref 5–15)
BUN: 10 MG/DL (ref 6–20)
Bun/Cre Ratio: 13 (ref 12–20)
CO2: 25 mmol/L (ref 21–32)
Calcium: 8.8 MG/DL (ref 8.5–10.1)
Chloride: 110 mmol/L — ABNORMAL HIGH (ref 97–108)
Creatinine: 0.78 MG/DL (ref 0.55–1.02)
Est, Glom Filt Rate: >60 mL/min/{1.73_m2} (ref >60)
Glucose: 117 mg/dL — ABNORMAL HIGH (ref 65–100)
Potassium: 4.3 mmol/L (ref 3.5–5.1)
Sodium: 141 mmol/L (ref 136–145)

## 2021-11-09 LAB — SYNOVIAL FLUID, CELL COUNT
Lymphocyte, Synovial Fluid: 9 % (ref 0–15)
MONOCYTES, SYNOVIAL FLUID: 16 % (ref 0–65)
RBC, Synovial Fluid: >100 /mm3 — ABNORMAL HIGH
SYN WBC Count: 950 /mm3 — ABNORMAL HIGH (ref 0–150)
Segmented Neutrophils, Synovial Fluid: 75 % — ABNORMAL HIGH (ref 0–20)

## 2021-11-09 MED ORDER — OXYCODONE HCL 5 MG PO TABS
5 | ORAL | Status: DC | PRN
Start: 2021-11-09 — End: 2021-11-12

## 2021-11-09 MED ORDER — OXYCODONE HCL 5 MG PO TABS
5 MG | ORAL | Status: AC | PRN
Start: 2021-11-09 — End: 2021-11-12
  Administered 2021-11-09 – 2021-11-12 (×8): 10 mg via ORAL

## 2021-11-09 MED FILL — LACTULOSE 10 GM/15ML PO SOLN: 10 GM/15ML | ORAL | Qty: 30

## 2021-11-09 MED FILL — K-TAB 10 MEQ PO TBCR: 10 MEQ | ORAL | Qty: 4

## 2021-11-09 MED FILL — ACETAMINOPHEN 325 MG PO TABS: 325 MG | ORAL | Qty: 2

## 2021-11-09 MED FILL — THIAMINE HCL 100 MG PO TABS: 100 MG | ORAL | Qty: 1

## 2021-11-09 MED FILL — XIFAXAN 200 MG PO TABS: 200 MG | ORAL | Qty: 2

## 2021-11-09 MED FILL — VANCOMYCIN HCL 1 G IV SOLR: 1 g | INTRAVENOUS | Qty: 1000

## 2021-11-09 MED FILL — OXYCODONE HCL 5 MG PO TABS: 5 MG | ORAL | Qty: 2

## 2021-11-09 MED FILL — PIPERACILLIN SOD-TAZOBACTAM SO 3.375 (3-0.375) G IV SOLR: 3.375 (3-0.375) g | INTRAVENOUS | Qty: 3375

## 2021-11-09 MED FILL — NICOTINE 21 MG/24HR TD PT24: 21 MG/24HR | TRANSDERMAL | Qty: 1

## 2021-11-09 MED FILL — CETIRIZINE HCL 10 MG PO TABS: 10 MG | ORAL | Qty: 1

## 2021-11-09 MED FILL — GABAPENTIN 600 MG PO TABS: 600 MG | ORAL | Qty: 1

## 2021-11-09 NOTE — Consults (Signed)
ORTHOPAEDIC CONSULT NOTE    Subjective:     Date of Consultation:  November 09, 2021  Referring Physician:  [name] is a 36 y.o. 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  Patient states this started after a fall on some rocks at [facility] last weekend.  She sustained a small laceration over the posterior aspect of her proximal forearm which she states had some pain and drainage.  She was seen approximately midweek this week at an urgent care and given prescription for oral antibiotics but told to follow-up with ER if her symptoms worsen worsening.  She was unable to start her antibiotics due to pharmacy not being open and found that her symptoms were continued to worsen in terms of pain, redness and swelling.  She presented here to this facility for further management.  She was admitted overnight.  She states that she has some pain overlying the posterior aspect of her proximal forearm and elbow region.  She states that she had no open wounds to the elbow itself only of the initial small laceration distal to the elbow.  She denies any issues with her elbow in the past.  She denies any shoulder, elbow joint or wrist pain.  She denies any new onset tingling or numbness.  Nursing reports patient had slight fever this morning.    Patient Active Problem List    Diagnosis Date Noted    Olecranon bursitis of left elbow 11/08/2021     No family history on file.   Social History     Tobacco Use    Smoking status: Every Day     Packs/day: 0.50     Types: Cigarettes     Passive exposure: Current    Smokeless tobacco: Never   Substance Use Topics    Alcohol use: Yes     Past Medical History:   Diagnosis Date    Hypertension     Liver disease       No past surgical history on file.   Prior to Admission medications    Medication Sig Start Date End Date Taking? Authorizing  Provider   gabapentin (NEURONTIN) 600 MG tablet TAKE 1 TABLET BY MOUTH TWO (2) TIMES A DAY. MAX DAILY AMOUNT: 2 TABS 08/26/21 09/25/21  Shelbie Hutching, MD   lactulose (CHRONULAC) 10 GM/15ML solution TAKE 30ML BY MOUTH TWICE A DAY 05/30/20   Ar Automatic Reconciliation   loratadine (CLARITIN) 10 MG tablet Take 1 tablet by mouth    Ar Automatic Reconciliation   rifAXIMin (XIFAXAN) 200 MG tablet Take 550 mg by mouth 2 times daily    Ar Automatic Reconciliation   thiamine 100 MG tablet Take 1 tablet by mouth daily    Ar Automatic Reconciliation     Current Facility-Administered Medications   Medication Dose Route Frequency    gabapentin (NEURONTIN) tablet 600 mg  600 mg Oral BID    lactulose (CHRONULAC) 10 GM/15ML solution 20 g  20 g Oral BID    cetirizine (ZYRTEC) tablet 10 mg  10 mg Oral Daily    rifAXIMin (XIFAXAN) tablet 400 mg  400 mg Oral TID    thiamine tablet 100 mg  100 mg Oral Daily    sodium chloride flush 0.9 % injection 5-40 mL  5-40 mL IntraVENous 2 times per day    sodium chloride flush 0.9 % injection  5-40 mL  5-40 mL IntraVENous PRN    0.9 % sodium chloride infusion   IntraVENous PRN    ondansetron (ZOFRAN-ODT) disintegrating tablet 4 mg  4 mg Oral Q8H PRN    Or    ondansetron (ZOFRAN) injection 4 mg  4 mg IntraVENous Q6H PRN    polyethylene glycol (GLYCOLAX) packet 17 g  17 g Oral Daily PRN    acetaminophen (TYLENOL) tablet 650 mg  650 mg Oral Q6H PRN    Or    acetaminophen (TYLENOL) suppository 650 mg  650 mg Rectal Q6H PRN    piperacillin-tazobactam (ZOSYN) 3,375 mg in sodium chloride 0.9 % 50 mL IVPB (mini-bag)  3,375 mg IntraVENous Q8H    Vancomycin- Pharmacy to Dose   Other RX Placeholder    vancomycin (VANCOCIN) 1,000 mg in sodium chloride 0.9 % 250 mL IVPB (Vial2Bag)  1,000 mg IntraVENous Q12H    nicotine (NICODERM CQ) 21 MG/24HR 1 patch  1 patch TransDERmal Daily    tetanus & diphtheria toxoids (adult) 5-2 LFU injection 0.5 mL  0.5 mL IntraMUSCular Once      No Known Allergies     Review of Systems:   Pertinent items are noted in HPI.    Mental Status: no dementia    Objective:     Patient Vitals for the past 8 hrs:   BP Temp Temp src Pulse Resp SpO2   11/09/21 0945 -- 100 F (37.8 C) -- -- -- --   11/09/21 0855 109/69 100.2 F (37.9 C) Oral 98 20 100 %     Temp (24hrs), Avg:99.5 F (37.5 C), Min:98 F (36.7 C), Max:100.2 F (37.9 C)      EXAM: Patient is awake and alert seem to be freely moving about the room; NAD; agreeable to exam  Left upper extremity noted to have a small 1 cm laceration overlying the dorsal aspect of her proximal forearm just distal to her elbow.  There is some erythema surrounding the site and extends proximally towards the elbow overlying the olecranon bursa but no active drainage and only a scant amount of serosanguineous drainage noted with expression attempt.  Left elbow is swollen and has area of fluctuance overlying the olecranon bursa; this region is moderately tender to palpation  Able to actively flex and extend elbow to command 5-5 strength for wrist flexion and extension as well as hand intrinsic function  Distal sensory function grossly intact  Distal pulses palpable    Imaging Review:   EXAM: CT ELBOW LEFT W CONTRAST     INDICATION: Rule out septic joint, abscess, cellulitis       COMPARISON: None     TECHNIQUE: Helical CT of the left elbow with coronal and sagittal reformats.  Images reviewed in soft tissue and bone windows.  CT dose reduction was achieved  through the use of a standardized protocol tailored for this examination and  automatic exposure control for dose modulation.     CONTRAST: Post IV contrast 100 mL of Isovue-300     FINDINGS: Bones: No fracture, dislocation, aggressive osteolysis, or periosteal  reaction.     Joint fluid: None.     Articulations: Mild osteoarthritis.     Tendons: No full-thickness tendon tear.     Muscles: No intramuscular hematoma. No focal atrophy.     Soft tissues: 3.5 cm x 2.9 cm x 0.8 cm rim-enhancing fluid collection  overlying  the olecranon (4-44, 601-9), with extensive surrounding fat  stranding/inflammatory change and overlying skin thickening.  IMPRESSION:  Olecranon bursitis (possibly septic given the provided clinical indication) with  associated cellulitis. No evidence of septic arthritis nor acute osteomyelitis.    Labs:   Recent Results (from the past 24 hour(s))   Blood Culture 1    Collection Time: 11/08/21  1:01 PM    Specimen: Blood   Result Value Ref Range    Special Requests NO SPECIAL REQUESTS      Culture NO GROWTH <24 HRS     CBC with Auto Differential    Collection Time: 11/08/21  1:01 PM   Result Value Ref Range    WBC 10.4 3.6 - 11.0 K/uL    RBC 4.16 3.80 - 5.20 M/uL    Hemoglobin 13.0 11.5 - 16.0 g/dL    Hematocrit 39.8 35.0 - 47.0 %    MCV 95.7 80.0 - 99.0 FL    MCH 31.3 26.0 - 34.0 PG    MCHC 32.7 30.0 - 36.5 g/dL    RDW 14.0 11.5 - 14.5 %    Platelets 141 (L) 150 - 400 K/uL    MPV 10.7 8.9 - 12.9 FL    Nucleated RBCs 0.0 0 PER 100 WBC    nRBC 0.00 0.00 - 0.01 K/uL    Neutrophils % 68 32 - 75 %    Lymphocytes % 22 12 - 49 %    Monocytes % 9 5 - 13 %    Eosinophils % 0 0 - 7 %    Basophils % 1 0 - 1 %    Immature Granulocytes 0 %    Neutrophils Absolute 7.1 1.8 - 8.0 K/UL    Lymphocytes Absolute 2.3 0.8 - 3.5 K/UL    Monocytes Absolute 0.9 0.0 - 1.0 K/UL    Eosinophils Absolute 0.0 0.0 - 0.4 K/UL    Basophils Absolute 0.1 0.0 - 0.1 K/UL    Absolute Immature Granulocyte 0.0 K/UL    Differential Type MANUAL      RBC Comment ANISOCYTOSIS  1+        WBC Comment ATYPICAL LYMPHOCYTES PRESENT     CMP    Collection Time: 11/08/21  1:01 PM   Result Value Ref Range    Sodium 135 (L) 136 - 145 mmol/L    Potassium 3.3 (L) 3.5 - 5.1 mmol/L    Chloride 105 97 - 108 mmol/L    CO2 22 21 - 32 mmol/L    Anion Gap 8 5 - 15 mmol/L    Glucose 95 65 - 100 mg/dL    BUN 9 6 - 20 MG/DL    Creatinine 0.76 0.55 - 1.02 MG/DL    Bun/Cre Ratio 12 12 - 20      Est, Glom Filt Rate >60 >60 ml/min/1.47m    Calcium 8.7 8.5 - 10.1 MG/DL     Total Bilirubin 0.5 0.2 - 1.0 MG/DL    ALT 28 12 - 78 U/L    AST 21 15 - 37 U/L    Alk Phosphatase 80 45 - 117 U/L    Total Protein 7.2 6.4 - 8.2 g/dL    Albumin 3.1 (L) 3.5 - 5.0 g/dL    Globulin 4.1 (H) 2.0 - 4.0 g/dL    Albumin/Globulin Ratio 0.8 (L) 1.1 - 2.2     Lactic Acid    Collection Time: 11/08/21  1:01 PM   Result Value Ref Range    Lactic Acid, Plasma 1.5 0.4 - 2.0 MMOL/L   Extra Tubes Hold    Collection  Time: 11/08/21  1:01 PM   Result Value Ref Range    Specimen HOld 1red 1blu     Comment:        Add-on orders for these samples will be processed based on acceptable specimen integrity and analyte stability, which may vary by analyte.   Magnesium    Collection Time: 11/08/21  1:01 PM   Result Value Ref Range    Magnesium 2.1 1.6 - 2.4 mg/dL   Blood Culture 2    Collection Time: 11/08/21  1:02 PM    Specimen: Blood   Result Value Ref Range    Special Requests NO SPECIAL REQUESTS      Culture NO GROWTH <24 HRS     Extra Tubes Hold    Collection Time: 11/08/21  5:25 PM   Result Value Ref Range    Specimen HOld 1BLD(1 GREEN 1 ORANGE)     Comment:        Add-on orders for these samples will be processed based on acceptable specimen integrity and analyte stability, which may vary by analyte.   Culture, Wound Aerobic Only    Collection Time: 11/08/21  5:40 PM    Specimen: Swab   Result Value Ref Range    Special Requests NO SPECIAL REQUESTS      Gram stain NO WBC'S SEEN      Gram stain OCCASIONAL APPARENT GRAM POS COCCI IN CHAINS      Gram stain OCCASIONAL APPARENT Gram negative rods      Culture PENDING    CBC with Auto Differential    Collection Time: 11/09/21  5:32 AM   Result Value Ref Range    WBC 8.2 3.6 - 11.0 K/uL    RBC 4.50 3.80 - 5.20 M/uL    Hemoglobin 14.1 11.5 - 16.0 g/dL    Hematocrit 43.7 35.0 - 47.0 %    MCV 97.1 80.0 - 99.0 FL    MCH 31.3 26.0 - 34.0 PG    MCHC 32.3 30.0 - 36.5 g/dL    RDW 14.0 11.5 - 14.5 %    Platelets 133 (L) 150 - 400 K/uL    MPV 11.3 8.9 - 12.9 FL    Nucleated RBCs 0.0  0 PER 100 WBC    nRBC 0.00 0.00 - 0.01 K/uL    Neutrophils % PENDING %    Lymphocytes % PENDING %    Monocytes % PENDING %    Eosinophils % PENDING %    Basophils % PENDING %    Immature Granulocytes PENDING %    Neutrophils Absolute PENDING K/UL    Lymphocytes Absolute PENDING K/UL    Monocytes Absolute PENDING K/UL    Eosinophils Absolute PENDING K/UL    Basophils Absolute PENDING K/UL    Absolute Immature Granulocyte PENDING K/UL    Differential Type PENDING    Basic Metabolic Panel    Collection Time: 11/09/21  5:32 AM   Result Value Ref Range    Sodium 141 136 - 145 mmol/L    Potassium 4.3 3.5 - 5.1 mmol/L    Chloride 110 (H) 97 - 108 mmol/L    CO2 25 21 - 32 mmol/L    Anion Gap 6 5 - 15 mmol/L    Glucose 117 (H) 65 - 100 mg/dL    BUN 10 6 - 20 MG/DL    Creatinine 0.78 0.55 - 1.02 MG/DL    Bun/Cre Ratio 13 12 - 20      Est, Glom Filt Rate >  60 >60 ml/min/1.89m    Calcium 8.8 8.5 - 10.1 MG/DL         Impression:     Principal Problem:    Olecranon bursitis of left elbow  Resolved Problems:    * No resolved hospital problems. *      Plan:   Likely left elbow olecranon bursitis    Aspiration attempted at bedside to be sent for cell count and culture  Continue IV Abx for now  Medical management per primary team  Following for lab results  Dr FEnos Flingto see patient later today as well    MHonor JunesPA-C  Orthopedic Trauma Service  [facility]

## 2021-11-09 NOTE — Consults (Signed)
ORTHOPAEDIC CONSULT NOTE    Subjective:     Date of Consultation:  November 09, 2021  Referring Physician:  Zykia Walla is a 36 y.o. 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  Patient states this started after a fall on some rocks at [facility] last weekend.  She sustained a small laceration over the posterior aspect of her proximal forearm which she states had some pain and drainage.  She was seen approximately midweek this week at an urgent care and given prescription for oral antibiotics but told to follow-up with ER if her symptoms worsen worsening.  She was unable to start her antibiotics due to pharmacy not being open and found that her symptoms were continued to worsen in terms of pain, redness and swelling.  She presented here to this facility for further management.  She was admitted overnight.  She states that she has some pain overlying the posterior aspect of her proximal forearm and elbow region.  She states that she had no open wounds to the elbow itself only of the initial small laceration distal to the elbow.  She denies any issues with her elbow in the past.  She denies any shoulder, elbow joint or wrist pain.  She denies any new onset tingling or numbness.  Nursing reports patient had slight fever this morning.    At this time she reports no subjective improvement over her symptoms from when she arrived and since she started the IV antibiotics.    Patient Active Problem List    Diagnosis Date Noted    Olecranon bursitis of left elbow 11/08/2021     No family history on file.   Social History     Tobacco Use    Smoking status: Every Day     Packs/day: 0.50     Types: Cigarettes     Passive exposure: Current    Smokeless tobacco: Never   Substance Use Topics    Alcohol use: Yes     Past Medical History:   Diagnosis Date    Hypertension     Liver  disease       No past surgical history on file.   Prior to Admission medications    Medication Sig Start Date End Date Taking? Authorizing Provider   gabapentin (NEURONTIN) 600 MG tablet TAKE 1 TABLET BY MOUTH TWO (2) TIMES A DAY. MAX DAILY AMOUNT: 2 TABS 08/26/21 09/25/21  Shelbie Hutching, MD   lactulose (CHRONULAC) 10 GM/15ML solution TAKE 30ML BY MOUTH TWICE A DAY 05/30/20   Ar Automatic Reconciliation   loratadine (CLARITIN) 10 MG tablet Take 1 tablet by mouth    Ar Automatic Reconciliation   rifAXIMin (XIFAXAN) 200 MG tablet Take 550 mg by mouth 2 times daily    Ar Automatic Reconciliation   thiamine 100 MG tablet Take 1 tablet by mouth daily    Ar Automatic Reconciliation     Current Facility-Administered Medications   Medication Dose Route Frequency    gabapentin (NEURONTIN) tablet 600 mg  600 mg Oral BID    lactulose (CHRONULAC) 10 GM/15ML solution 20 g  20 g Oral BID    cetirizine (ZYRTEC) tablet 10 mg  10 mg Oral Daily    rifAXIMin (XIFAXAN) tablet 400 mg  400 mg Oral TID    thiamine tablet 100 mg  100 mg Oral Daily  sodium chloride flush 0.9 % injection 5-40 mL  5-40 mL IntraVENous 2 times per day    sodium chloride flush 0.9 % injection 5-40 mL  5-40 mL IntraVENous PRN    0.9 % sodium chloride infusion   IntraVENous PRN    ondansetron (ZOFRAN-ODT) disintegrating tablet 4 mg  4 mg Oral Q8H PRN    Or    ondansetron (ZOFRAN) injection 4 mg  4 mg IntraVENous Q6H PRN    polyethylene glycol (GLYCOLAX) packet 17 g  17 g Oral Daily PRN    acetaminophen (TYLENOL) tablet 650 mg  650 mg Oral Q6H PRN    Or    acetaminophen (TYLENOL) suppository 650 mg  650 mg Rectal Q6H PRN    piperacillin-tazobactam (ZOSYN) 3,375 mg in sodium chloride 0.9 % 50 mL IVPB (mini-bag)  3,375 mg IntraVENous Q8H    Vancomycin- Pharmacy to Dose   Other RX Placeholder    vancomycin (VANCOCIN) 1,000 mg in sodium chloride 0.9 % 250 mL IVPB (Vial2Bag)  1,000 mg IntraVENous Q12H    nicotine (NICODERM CQ) 21 MG/24HR 1 patch  1 patch TransDERmal Daily     tetanus & diphtheria toxoids (adult) 5-2 LFU injection 0.5 mL  0.5 mL IntraMUSCular Once      No Known Allergies     Review of Systems:  Pertinent items are noted in HPI.    Mental Status: no dementia    Objective:     Patient Vitals for the past 8 hrs:   BP Temp Temp src Pulse Resp SpO2   11/09/21 0945 -- 100 F (37.8 C) -- -- -- --   11/09/21 0855 109/69 100.2 F (37.9 C) Oral 98 20 100 %       Temp (24hrs), Avg:99.5 F (37.5 C), Min:98 F (36.7 C), Max:100.2 F (37.9 C)      EXAM: Patient is awake and alert seem to be freely moving about the room; NAD; agreeable to exam    Left upper extremity noted to have a small 1 cm laceration overlying the dorsal aspect of her proximal forearm just distal to her elbow.  There is some erythema surrounding the site and extends proximally towards the elbow overlying the olecranon bursa but no active drainage and only a scant amount of serosanguineous drainage noted with expression attempt and on the 4 x 4.  There is some redness which also extends distally.    Left elbow is swollen and has area of fluctuance overlying the olecranon bursa; this region is very tender to palpation however aspiration attempt just occurred earlier today.    Able to actively flex and extend elbow to command 5-5 strength for wrist flexion and extension as well as hand intrinsic function    Distal sensory function grossly intact    Distal pulses palpable    Imaging Review:   EXAM: CT ELBOW LEFT W CONTRAST     INDICATION: Rule out septic joint, abscess, cellulitis       COMPARISON: None     TECHNIQUE: Helical CT of the left elbow with coronal and sagittal reformats.  Images reviewed in soft tissue and bone windows.  CT dose reduction was achieved  through the use of a standardized protocol tailored for this examination and  automatic exposure control for dose modulation.     CONTRAST: Post IV contrast 100 mL of Isovue-300     FINDINGS: Bones: No fracture, dislocation, aggressive osteolysis, or  periosteal  reaction.     Joint fluid: None.  Articulations: Mild osteoarthritis.     Tendons: No full-thickness tendon tear.     Muscles: No intramuscular hematoma. No focal atrophy.     Soft tissues: 3.5 cm x 2.9 cm x 0.8 cm rim-enhancing fluid collection overlying  the olecranon (4-44, 601-9), with extensive surrounding fat  stranding/inflammatory change and overlying skin thickening.     IMPRESSION:  Olecranon bursitis (possibly septic given the provided clinical indication) with  associated cellulitis. No evidence of septic arthritis nor acute osteomyelitis.    Labs:   Recent Results (from the past 24 hour(s))   Blood Culture 1    Collection Time: 11/08/21  1:01 PM    Specimen: Blood   Result Value Ref Range    Special Requests NO SPECIAL REQUESTS      Culture NO GROWTH <24 HRS     CBC with Auto Differential    Collection Time: 11/08/21  1:01 PM   Result Value Ref Range    WBC 10.4 3.6 - 11.0 K/uL    RBC 4.16 3.80 - 5.20 M/uL    Hemoglobin 13.0 11.5 - 16.0 g/dL    Hematocrit 39.8 35.0 - 47.0 %    MCV 95.7 80.0 - 99.0 FL    MCH 31.3 26.0 - 34.0 PG    MCHC 32.7 30.0 - 36.5 g/dL    RDW 14.0 11.5 - 14.5 %    Platelets 141 (L) 150 - 400 K/uL    MPV 10.7 8.9 - 12.9 FL    Nucleated RBCs 0.0 0 PER 100 WBC    nRBC 0.00 0.00 - 0.01 K/uL    Neutrophils % 68 32 - 75 %    Lymphocytes % 22 12 - 49 %    Monocytes % 9 5 - 13 %    Eosinophils % 0 0 - 7 %    Basophils % 1 0 - 1 %    Immature Granulocytes 0 %    Neutrophils Absolute 7.1 1.8 - 8.0 K/UL    Lymphocytes Absolute 2.3 0.8 - 3.5 K/UL    Monocytes Absolute 0.9 0.0 - 1.0 K/UL    Eosinophils Absolute 0.0 0.0 - 0.4 K/UL    Basophils Absolute 0.1 0.0 - 0.1 K/UL    Absolute Immature Granulocyte 0.0 K/UL    Differential Type MANUAL      RBC Comment ANISOCYTOSIS  1+        WBC Comment ATYPICAL LYMPHOCYTES PRESENT     CMP    Collection Time: 11/08/21  1:01 PM   Result Value Ref Range    Sodium 135 (L) 136 - 145 mmol/L    Potassium 3.3 (L) 3.5 - 5.1 mmol/L    Chloride 105 97 -  108 mmol/L    CO2 22 21 - 32 mmol/L    Anion Gap 8 5 - 15 mmol/L    Glucose 95 65 - 100 mg/dL    BUN 9 6 - 20 MG/DL    Creatinine 0.76 0.55 - 1.02 MG/DL    Bun/Cre Ratio 12 12 - 20      Est, Glom Filt Rate >60 >60 ml/min/1.72m    Calcium 8.7 8.5 - 10.1 MG/DL    Total Bilirubin 0.5 0.2 - 1.0 MG/DL    ALT 28 12 - 78 U/L    AST 21 15 - 37 U/L    Alk Phosphatase 80 45 - 117 U/L    Total Protein 7.2 6.4 - 8.2 g/dL    Albumin 3.1 (L) 3.5 - 5.0 g/dL  Globulin 4.1 (H) 2.0 - 4.0 g/dL    Albumin/Globulin Ratio 0.8 (L) 1.1 - 2.2     Lactic Acid    Collection Time: 11/08/21  1:01 PM   Result Value Ref Range    Lactic Acid, Plasma 1.5 0.4 - 2.0 MMOL/L   Extra Tubes Hold    Collection Time: 11/08/21  1:01 PM   Result Value Ref Range    Specimen HOld 1red 1blu     Comment:        Add-on orders for these samples will be processed based on acceptable specimen integrity and analyte stability, which may vary by analyte.   Magnesium    Collection Time: 11/08/21  1:01 PM   Result Value Ref Range    Magnesium 2.1 1.6 - 2.4 mg/dL   Blood Culture 2    Collection Time: 11/08/21  1:02 PM    Specimen: Blood   Result Value Ref Range    Special Requests NO SPECIAL REQUESTS      Culture NO GROWTH <24 HRS     Extra Tubes Hold    Collection Time: 11/08/21  5:25 PM   Result Value Ref Range    Specimen HOld 1BLD(1 GREEN 1 ORANGE)     Comment:        Add-on orders for these samples will be processed based on acceptable specimen integrity and analyte stability, which may vary by analyte.   Culture, Wound Aerobic Only    Collection Time: 11/08/21  5:40 PM    Specimen: Swab   Result Value Ref Range    Special Requests NO SPECIAL REQUESTS      Gram stain NO WBC'S SEEN      Gram stain OCCASIONAL APPARENT GRAM POS COCCI IN CHAINS      Gram stain OCCASIONAL APPARENT Gram negative rods      Culture PENDING    CBC with Auto Differential    Collection Time: 11/09/21  5:32 AM   Result Value Ref Range    WBC 8.2 3.6 - 11.0 K/uL    RBC 4.50 3.80 - 5.20 M/uL     Hemoglobin 14.1 11.5 - 16.0 g/dL    Hematocrit 43.7 35.0 - 47.0 %    MCV 97.1 80.0 - 99.0 FL    MCH 31.3 26.0 - 34.0 PG    MCHC 32.3 30.0 - 36.5 g/dL    RDW 14.0 11.5 - 14.5 %    Platelets 133 (L) 150 - 400 K/uL    MPV 11.3 8.9 - 12.9 FL    Nucleated RBCs 0.0 0 PER 100 WBC    nRBC 0.00 0.00 - 0.01 K/uL    Neutrophils % PENDING %    Lymphocytes % PENDING %    Monocytes % PENDING %    Eosinophils % PENDING %    Basophils % PENDING %    Immature Granulocytes PENDING %    Neutrophils Absolute PENDING K/UL    Lymphocytes Absolute PENDING K/UL    Monocytes Absolute PENDING K/UL    Eosinophils Absolute PENDING K/UL    Basophils Absolute PENDING K/UL    Absolute Immature Granulocyte PENDING K/UL    Differential Type PENDING    Basic Metabolic Panel    Collection Time: 11/09/21  5:32 AM   Result Value Ref Range    Sodium 141 136 - 145 mmol/L    Potassium 4.3 3.5 - 5.1 mmol/L    Chloride 110 (H) 97 - 108 mmol/L    CO2 25 21 -  32 mmol/L    Anion Gap 6 5 - 15 mmol/L    Glucose 117 (H) 65 - 100 mg/dL    BUN 10 6 - 20 MG/DL    Creatinine 0.78 0.55 - 1.02 MG/DL    Bun/Cre Ratio 13 12 - 20      Est, Glom Filt Rate >60 >60 ml/min/1.28m    Calcium 8.8 8.5 - 10.1 MG/DL         Impression:     Principal Problem:    Olecranon bursitis of left elbow  Resolved Problems:    * No resolved hospital problems. *      Plan:   Patient with aseptic versus septic olecranon bursitis and associated cellulitis    Aspiration attempted at bedside to be sent for cell count and culture earlier today  I made the patient n.p.o. at this time.  Also discussed with the patient's nurse she needs pillows for elevation of the arm and also ice to help with the pain and swelling.  We will await the results of the cultures and if Gram stain is negative I will plan for letting the patient eat and then n.p.o. again at midnight for reevaluation tomorrow morning.  If the Gram stain is positive she can remain n.p.o. and plan for I&D today.  Patient verbalizes  understanding and is in agreement of this treatment plan.    Prognosis somewhat uncertain at this time we will see what the results show.    Continue IV Abx for now  Medical management per primary team  Following for lab results  Dr FEnos Flingto see patient later today as well      [facility]

## 2021-11-09 NOTE — Progress Notes (Cosign Needed)
[facility]                                                                                          Hospitalist Progress Note  Mike Gip, APRN - NP  Answering service: [phone] OR [phone] from in house phone        Date of Service:  11/09/2021  NAME:  Michelle Singleton  DOB:  23-Apr-1985  MRN:  [id number]       Admission Summary:   Michelle Singleton is a 36 y.o. female with liver cirrhosis, alcohol abuse, nicotine dependence, anxiety, neuropathy and HTN who presented to the ED with a swollen and painful L elbow since [place] it on Monday 8/14. She went to urgent care about 2 days ago, had an xray which reportedly said there was a fracture and she was given a script for cephalexin but was unable to pick it up yesterday as [facility] had closed. She went to [facility] as it was not getting better and was advised to come to the ER. The L elbow is red, swollen, and draining yellow fluid and pus at the laceration site. She denies f/c/v /diarrhea/constipation/dysuria, CP, and SOB but has had some nausea. She has not taken any abx for this.     VS were stable in the ED. Lactic acid and WBC count were normal. CT L elbow showed olecranon bursitis with cellulitis but no evidence of septic arthritis or acute osteomyelitis.     Interval history / Subjective:   Patient seen, lying in bed. Reports severe left elbow pain. Seen by Ortho with left elbow aspiration today - possible I&D pending culture results.     Patient reports previously having gone to rehab for alcohol abuse. She endorses ongoing occasional alcohol use.      Assessment & Plan:     L olecranon bursitis with cellulitis and laceration s/p fall [place] the river  - CT L elbow 8/17 olecranon bursitis with cellulitis, no e/o septic arthritis or acute osteomyelitis  - L elbow laceration with seropurulent drainage   - Ortho consulted: aspiration performed 8/18, possible I&D pending culture results  - left elbow fluid  culture (8/18): no organisms seen, final results pending  - BC X2 (8/18): NGTD  - continue vancomycin and Zosyn  - OT consulted  - PRN oxycodone     Hypokalemia, resolved  - K 4.3  - monitor labs     HTN  - stable  - not on home medications  - monitor vitals     Liver cirrhosis with h/o hepatic encephalopathy  - continue home lactulose, rifaximin, and thiamine     Alcohol abuse  - monitor for alcohol withdrawal  - patient reports prior heavy alcohol use and rehab, now only occasional alcohol use  - counseled on cessation     Anxiety  - stable  - no home anxiolytics     Neuropathy  - continue home gabapentin     Nicotine dependence  - continue nicotine patch  - counseled on cessation     Code status: Full  Prophylaxis: SCDs  Care Plan discussed with: patient, RN, Ortho  Anticipated Disposition: TBD  Inpatient  Cardiac monitoring: None     Principal Problem:    Olecranon bursitis of left elbow  Resolved Problems:    * No resolved hospital problems. *         Social Determinants of Health     Tobacco Use: High Risk    Smoking Tobacco Use: Every Day    Smokeless Tobacco Use: Never    Passive Exposure: Current   Alcohol Use: Not on file   Financial Resource Strain: Not on file   Food Insecurity: Not on file   Transportation Needs: Not on file   Physical Activity: Not on file   Stress: Not on file   Social Connections: Not on file   Intimate Partner Violence: Not on file   Depression: Not on file   Housing Stability: Not on file       Review of Systems:   Complains of left elbow pain      Vital Signs:    Last 24hrs VS reviewed since prior progress note. Most recent are:  Vitals:    11/08/21 2245   BP: (!) 95/56   Pulse: 100   Resp: 17   Temp: 99.4 F (37.4 C)   SpO2:        No intake or output data in the 24 hours ending 11/09/21 0820     Physical Examination:     I had a face to face encounter with this patient and independently examined them on 11/09/2021 as outlined below:          General : alert x 3, awake, no acute  distress  HEENT: PEERL, EOMI, moist mucus membrane  Neck: supple, no JVD  Chest: Clear to auscultation bilaterally   CVS: S1 S2 heard, Capillary refill less than 2 seconds  Abd: soft/non tender, non distended, BS physiological,   Ext: no clubbing, no cyanosis, no edema, brisk 2+ DP pulses  Neuro/Psych: pleasant mood and affect, CN 2-12 grossly intact, sensory grossly within normal limit, Strength 5/5 in all extremities  Skin: warm, left elbow with swelling and erythema - foam dressing in place    Data Review:    Review and/or order of clinical lab test  Review and/or order of tests in the radiology section of CPT  Review and/or order of tests in the medicine section of CPT      I have personally and independently reviewed all pertinent labs, diagnostic studies, imaging, and have provided independent interpretation of the same.     Labs:     Recent Labs     11/08/21  1301 11/09/21  0532   WBC 10.4 8.2   HGB 13.0 14.1   HCT 39.8 43.7   PLT 141* 133*     Recent Labs     11/08/21  1301 11/09/21  0532   NA 135* 141   K 3.3* 4.3   CL 105 110*   CO2 22 25   BUN 9 10   MG 2.1  --      Recent Labs     11/08/21  1301   ALT 28   GLOB 4.1*     No results for input(s): INR, APTT in the last 72 hours.    Invalid input(s): PTP   No results for input(s): TIBC, FERR in the last 72 hours.    Invalid input(s): FE, PSAT   No results found for: FOL, RBCF  No results for input(s): PH, PCO2, PO2 in the last 72 hours.  No results for input(s): CPK in the last 72 hours.    Invalid input(s): CPKMB, CKNDX, TROIQ  No results found for: CHOL, CHOLX, CHLST, CHOLV, HDL, HDLC, LDL, LDLC, TGLX, TRIGL  No results found for: GLUCPOC    Notes reviewed from all clinical/nonclinical/nursing services involved in patient's clinical care. Care coordination discussions were held with appropriate clinical/nonclinical/ nursing providers based on care coordination needs.     Patients current active Medications were reviewed, considered, added and adjusted  based on the clinical condition today.      Home Medications were reconciled to the best of my ability given all available resources at the time of admission. Route is PO if not otherwise noted.    Medications Reviewed:     Current Facility-Administered Medications   Medication Dose Route Frequency    gabapentin (NEURONTIN) tablet 600 mg  600 mg Oral BID    lactulose (CHRONULAC) 10 GM/15ML solution 20 g  20 g Oral BID    cetirizine (ZYRTEC) tablet 10 mg  10 mg Oral Daily    rifAXIMin (XIFAXAN) tablet 400 mg  400 mg Oral TID    thiamine tablet 100 mg  100 mg Oral Daily    sodium chloride flush 0.9 % injection 5-40 mL  5-40 mL IntraVENous 2 times per day    sodium chloride flush 0.9 % injection 5-40 mL  5-40 mL IntraVENous PRN    0.9 % sodium chloride infusion   IntraVENous PRN    ondansetron (ZOFRAN-ODT) disintegrating tablet 4 mg  4 mg Oral Q8H PRN    Or    ondansetron (ZOFRAN) injection 4 mg  4 mg IntraVENous Q6H PRN    polyethylene glycol (GLYCOLAX) packet 17 g  17 g Oral Daily PRN    acetaminophen (TYLENOL) tablet 650 mg  650 mg Oral Q6H PRN    Or    acetaminophen (TYLENOL) suppository 650 mg  650 mg Rectal Q6H PRN    piperacillin-tazobactam (ZOSYN) 3,375 mg in sodium chloride 0.9 % 50 mL IVPB (mini-bag)  3,375 mg IntraVENous Q8H    Vancomycin- Pharmacy to Dose   Other RX Placeholder    vancomycin (VANCOCIN) 1,000 mg in sodium chloride 0.9 % 250 mL IVPB (Vial2Bag)  1,000 mg IntraVENous Q12H    nicotine (NICODERM CQ) 21 MG/24HR 1 patch  1 patch TransDERmal Daily    tetanus & diphtheria toxoids (adult) 5-2 LFU injection 0.5 mL  0.5 mL IntraMUSCular Once     ______________________________________________________________________  EXPECTED LENGTH OF STAY: Unable to retrieve estimated LOS  ACTUAL LENGTH OF STAY:          1                 Mike Gip, APRN - NP

## 2021-11-09 NOTE — Progress Notes (Signed)
Occupational Therapy   Chart reviewed; will defer OT pending ortho consult with possible L elbow fx. Will follow. Biagio Borg OTR/L

## 2021-11-10 LAB — CULTURE, WOUND: Gram stain: NONE SEEN

## 2021-11-10 LAB — BASIC METABOLIC PANEL
Anion Gap: 4 mmol/L — ABNORMAL LOW (ref 5–15)
BUN: 10 MG/DL (ref 6–20)
Bun/Cre Ratio: 15 (ref 12–20)
CO2: 24 mmol/L (ref 21–32)
Calcium: 8.5 MG/DL (ref 8.5–10.1)
Chloride: 109 mmol/L — ABNORMAL HIGH (ref 97–108)
Creatinine: 0.65 MG/DL (ref 0.55–1.02)
Est, Glom Filt Rate: >60 mL/min/{1.73_m2} (ref >60)
Glucose: 108 mg/dL — ABNORMAL HIGH (ref 65–100)
Potassium: 4.2 mmol/L (ref 3.5–5.1)
Sodium: 137 mmol/L (ref 136–145)

## 2021-11-10 LAB — CBC WITH AUTO DIFFERENTIAL
Absolute Immature Granulocyte: 0 10*3/uL
Basophils %: 1 % (ref 0–1)
Basophils Absolute: 0.1 10*3/uL (ref 0.0–0.1)
Eosinophils %: 1 % (ref 0–7)
Eosinophils Absolute: 0.1 10*3/uL (ref 0.0–0.4)
Hematocrit: 37.7 % (ref 35.0–47.0)
Hemoglobin: 12 g/dL (ref 11.5–16.0)
Immature Granulocytes: 0 %
Lymphocytes %: 36 % (ref 12–49)
Lymphocytes Absolute: 2.5 10*3/uL (ref 0.8–3.5)
MCH: 31.1 PG (ref 26.0–34.0)
MCHC: 31.8 g/dL (ref 30.0–36.5)
MCV: 97.7 FL (ref 80.0–99.0)
MPV: 11.3 FL (ref 8.9–12.9)
Monocytes %: 15 % — ABNORMAL HIGH (ref 5–13)
Monocytes Absolute: 1 10*3/uL (ref 0.0–1.0)
Neutrophils %: 47 % (ref 32–75)
Neutrophils Absolute: 3.2 10*3/uL (ref 1.8–8.0)
Nucleated RBCs: 0 PER 100 WBC
Platelets: 138 10*3/uL — ABNORMAL LOW (ref 150–400)
RBC: 3.86 M/uL (ref 3.80–5.20)
RDW: 13.8 % (ref 11.5–14.5)
WBC Comment: REACTIVE
WBC: 6.9 10*3/uL (ref 3.6–11.0)
nRBC: 0 10*3/uL (ref 0.00–0.01)

## 2021-11-10 LAB — CULTURE, ANAEROBIC

## 2021-11-10 MED ORDER — LORAZEPAM 2 MG/ML IJ SOLN
2 MG/ML | INTRAMUSCULAR | Status: AC | PRN
Start: 2021-11-10 — End: 2021-11-12
  Administered 2021-11-10 (×2): 2 mg via INTRAVENOUS

## 2021-11-10 MED ORDER — LORAZEPAM 1 MG PO TABS
1 MG | ORAL | Status: AC | PRN
Start: 2021-11-10 — End: 2021-11-12

## 2021-11-10 MED ORDER — VANCOMYCIN INTERMITTENT DOSING (PLACEHOLDER)
Freq: Once | INTRAVENOUS | Status: AC
Start: 2021-11-10 — End: 2021-11-11
  Administered 2021-11-11: 09:00:00

## 2021-11-10 MED ORDER — KETOROLAC TROMETHAMINE 30 MG/ML IJ SOLN
30 MG/ML | Freq: Four times a day (QID) | INTRAMUSCULAR | Status: AC
Start: 2021-11-10 — End: 2021-11-10
  Administered 2021-11-10 – 2021-11-11 (×3): 30 mg via INTRAVENOUS

## 2021-11-10 MED ORDER — LORAZEPAM 2 MG/ML IJ SOLN
2 MG/ML | INTRAMUSCULAR | Status: AC | PRN
Start: 2021-11-10 — End: 2021-11-12

## 2021-11-10 MED ORDER — LORAZEPAM 1 MG PO TABS
1 MG | ORAL | Status: DC | PRN
Start: 2021-11-10 — End: 2021-11-12

## 2021-11-10 MED FILL — GABAPENTIN 600 MG PO TABS: 600 MG | ORAL | Qty: 1

## 2021-11-10 MED FILL — KETOROLAC TROMETHAMINE 30 MG/ML IJ SOLN: 30 MG/ML | INTRAMUSCULAR | Qty: 1

## 2021-11-10 MED FILL — VANCOMYCIN HCL 1 G IV SOLR: 1 g | INTRAVENOUS | Qty: 1000

## 2021-11-10 MED FILL — PIPERACILLIN SOD-TAZOBACTAM SO 3.375 (3-0.375) G IV SOLR: 3.375 (3-0.375) g | INTRAVENOUS | Qty: 3375

## 2021-11-10 MED FILL — OXYCODONE HCL 5 MG PO TABS: 5 MG | ORAL | Qty: 2

## 2021-11-10 MED FILL — NICOTINE 21 MG/24HR TD PT24: 21 MG/24HR | TRANSDERMAL | Qty: 1

## 2021-11-10 MED FILL — CETIRIZINE HCL 10 MG PO TABS: 10 MG | ORAL | Qty: 1

## 2021-11-10 MED FILL — LACTULOSE 10 GM/15ML PO SOLN: 10 GM/15ML | ORAL | Qty: 30

## 2021-11-10 MED FILL — XIFAXAN 200 MG PO TABS: 200 MG | ORAL | Qty: 2

## 2021-11-10 MED FILL — LORAZEPAM 2 MG/ML IJ SOLN: 2 MG/ML | INTRAMUSCULAR | Qty: 1

## 2021-11-10 MED FILL — THIAMINE HCL 100 MG PO TABS: 100 MG | ORAL | Qty: 1

## 2021-11-10 MED FILL — VANCOMYCIN INTERMITTENT DOSING (PLACEHOLDER): INTRAVENOUS | Qty: 1

## 2021-11-10 NOTE — Progress Notes (Signed)
ORTHO PROGRESS NOTE    November 10, 2021  Admit Date:   11/08/2021    Post Op day: * No surgery found *    Subjective:    Michelle Singleton states that she feels somewhat better but still has left elbow and forearm pain.  Aspirate from left elbow bursa benign in nature with nucleated cell count only 950 and no organisms noted on Gram stain.  Patient remains on IV antibiotics.  Denies N/V/SOB or CP    PT/OT:   Gait:                    Vital Signs:    Patient Vitals for the past 8 hrs:   BP Temp Temp src Pulse Resp SpO2   11/10/21 0915 108/72 98.1 F (36.7 C) Oral 57 16 --   11/10/21 0400 125/71 98 F (36.7 C) Oral 70 16 100 %     Temp (24hrs), Avg:98.2 F (36.8 C), Min:98 F (36.7 C), Max:98.4 F (36.9 C)      Pain Control:        Meds:    Current Facility-Administered Medications   Medication Dose Route Frequency    ketorolac (TORADOL) injection 30 mg  30 mg IntraVENous Q6H    oxyCODONE (ROXICODONE) immediate release tablet 5 mg  5 mg Oral Q4H PRN    oxyCODONE (ROXICODONE) immediate release tablet 10 mg  10 mg Oral Q4H PRN    gabapentin (NEURONTIN) tablet 600 mg  600 mg Oral BID    lactulose (CHRONULAC) 10 GM/15ML solution 20 g  20 g Oral BID    cetirizine (ZYRTEC) tablet 10 mg  10 mg Oral Daily    rifAXIMin (XIFAXAN) tablet 400 mg  400 mg Oral TID    thiamine tablet 100 mg  100 mg Oral Daily    sodium chloride flush 0.9 % injection 5-40 mL  5-40 mL IntraVENous 2 times per day    sodium chloride flush 0.9 % injection 5-40 mL  5-40 mL IntraVENous PRN    0.9 % sodium chloride infusion   IntraVENous PRN    ondansetron (ZOFRAN-ODT) disintegrating tablet 4 mg  4 mg Oral Q8H PRN    Or    ondansetron (ZOFRAN) injection 4 mg  4 mg IntraVENous Q6H PRN    polyethylene glycol (GLYCOLAX) packet 17 g  17 g Oral Daily PRN    acetaminophen (TYLENOL) tablet 650 mg  650 mg Oral Q6H PRN    Or    acetaminophen (TYLENOL) suppository 650 mg  650 mg Rectal Q6H PRN    piperacillin-tazobactam (ZOSYN) 3,375 mg in sodium chloride 0.9 % 50 mL  IVPB (mini-bag)  3,375 mg IntraVENous Q8H    Vancomycin- Pharmacy to Dose   Other RX Placeholder    vancomycin (VANCOCIN) 1,000 mg in sodium chloride 0.9 % 250 mL IVPB (Vial2Bag)  1,000 mg IntraVENous Q12H    nicotine (NICODERM CQ) 21 MG/24HR 1 patch  1 patch TransDERmal Daily    tetanus & diphtheria toxoids (adult) 5-2 LFU injection 0.5 mL  0.5 mL IntraMUSCular Once       LAB:    Recent Labs     11/10/21  0626   HCT 37.7   HGB 12.0       Transfuse PRBC's:      Assessment & Physician's Comment:  Dressing is serosanguineous  Neurovascular checks within normal limits  Orientation:  Oriented  Patient awake and alert; NAD; noted to be up walking around the unit and  also using vape pen in room; examination of the left forearm reveals slightly more drainage on her laceration bandage but this is not saturated in nature and remains serosanguineous; left elbow overlying the olecranon remains somewhat boggy but erythema has improved along the dorsal aspect of the forearm; mild tenderness to palpation about elbow and more distally into the dorsal forearm; intact strength to wrist extension, flexion as well as hand intrinsic function; distal sensory function grossly intact; distal cap refill brisk    Principal Problem:    Olecranon bursitis of left elbow  Active Problems:    Septic bursitis of elbow, left  Resolved Problems:    * No resolved hospital problems. *      Plan:    At this time patient labs do not give indication for operative management of her fluid collection.  It is likely that her olecranon bursal collection is inflammatory in nature following her fall but considering her more distal laceration she should remain on antibiotics while this heals.  Diet ordered  Toradol added for likely inflammatory pain  Continue other pain regimen  Antibiotics per primary team but likely can transition to oral regimen  Can follow-up in the office with Dr. Carlye Grippe in 1 to 2 weeks for reevaluation  Medical management per  primary team  Okay to discharge from orthopedics perspective once Toradol dosing is complete    [name] aware and in agreement with current plan    [name] PA-C  Orthopedic Trauma Service  [facility]

## 2021-11-10 NOTE — Progress Notes (Signed)
Patient is very anxious, possible withdrawal, notified md of needing CIWA protocols added. Awaiting orders from MD/NP.

## 2021-11-10 NOTE — Progress Notes (Signed)
Occupational Therapy Note:     Spoke with paitnet.  She is up ad lib in her room with no concners for ADL activities.  She demonstrates less than full ROM in her left elbow only due to pain and slight swelling.  She has no swelling in her left hand but instructed her to monitor and watch as she keeps a ring on her ring finger.  Discussed finger flexion/extension to help circulate fluid and decreased stiffness in the hand.  She is independent with education.  Pt asking to walk around the unit and instructed she can mobilize with assistance of family taking IV pole with her.  Instructed not to go off the floor and let RN know if she is walking.      Pt has no needs for skilled acute therapy intervention.  Discharge services.     Desiree Lucy Malai Lady, OTR/L

## 2021-11-10 NOTE — Progress Notes (Signed)
1930 Bedside and Verbal shift change report given to [facility] (Cabin crew) by Raoul Pitch (offgoing nurse). Report included the following information Nurse Handoff Report, Adult Overview, Surgery Report, Intake/Output, and MAR.    2100 Patient woken up by this RN to complete vitals, medication administration, and CIWA assessment. Patient resting comfortably in no acute distress.       0730 Bedside and Verbal shift change report given to [facility] (Cabin crew) by Rolly Salter (offgoing nurse). Report included the following information Nurse Handoff Report, Adult Overview, Intake/Output, and MAR.

## 2021-11-10 NOTE — Progress Notes (Cosign Needed)
[facility]                                                                                          Hospitalist Progress Note  Mike Gip, APRN - NP  Answering service: [phone] OR [phone] from in house phone        Date of Service:  11/10/2021  NAME:  Michelle Singleton  DOB:  11-Jan-1986  MRN:  [id number]       Admission Summary:   Sharlie Shreffler is a 36 y.o. female with liver cirrhosis, alcohol abuse, nicotine dependence, anxiety, neuropathy and HTN who presented to the ED with a swollen and painful L elbow [place]. She went to urgent care about 2 days ago, had an xray which reportedly said there was a fracture and she was given a script for cephalexin but was unable to pick it up yesterday as [facility]. She went to [facility] as it was not getting better and was advised to come to the ER. The L elbow is red, swollen, and draining yellow fluid and pus at the laceration site. She denies f/c/v /diarrhea/constipation/dysuria, CP, and SOB but has had some nausea. She has not taken any abx for this.     VS were stable in the ED. Lactic acid and WBC count were normal. CT L elbow showed olecranon bursitis with cellulitis but no evidence of septic arthritis or acute osteomyelitis.     Interval history / Subjective:      Patient seen and examined, standing at side of bed at time of assessment. Notes that she still has left elbow pain. Seen by Orthopedics this morning, Toradol added for bursitis pain.     Left arm with erythema and swelling improved from yesterday.  Assessment & Plan:     L olecranon bursitis with cellulitis and laceration s/p fall [place] the river  - CT L elbow 8/17 olecranon bursitis with cellulitis, no e/o septic arthritis or acute osteomyelitis  - L elbow laceration with seropurulent drainage   - Ortho consulted: left elbow bursa aspiration performed 8/19 with benign culture results, will need outpatient follow-up in  1-2 weeks  - left elbow bursa fluid culture (8/18): NGTD  - BC X2 (8/18): NGTD  - continue vancomycin and Zosyn  - OT consulted: no skilled therapy needs  - PRN oxycodone, scheduled Toradol per Ortho     Hypokalemia, resolved  - K 4.2  - monitor labs     HTN  - stable  - not on home medications  - monitor vitals     Liver cirrhosis with h/o hepatic encephalopathy  Thrombocytopenia  - continue home lactulose, rifaximin, and thiamine  - platelets 138, stable     Alcohol abuse  - monitor for alcohol withdrawal  - patient reports prior heavy alcohol use and rehab, now only occasional alcohol use  - counseled on cessation  - CIWA scoring with PRN Ativan  - fall and seizure precautions     Anxiety  - stable  - no home anxiolytics  Neuropathy  - continue home gabapentin     Nicotine dependence  - continue nicotine patch  - counseled on cessation     Code status: Full  Prophylaxis: SCDs  Care Plan discussed with: patient, RN, Ortho  Anticipated Disposition: TBD  Inpatient  Cardiac monitoring: None     Principal Problem:    Olecranon bursitis of left elbow  Active Problems:    Septic bursitis of elbow, left  Resolved Problems:    * No resolved hospital problems. *         Social Determinants of Health     Tobacco Use: High Risk    Smoking Tobacco Use: Every Day    Smokeless Tobacco Use: Never    Passive Exposure: Current   Alcohol Use: Not on file   Financial Resource Strain: Not on file   Food Insecurity: Not on file   Transportation Needs: Not on file   Physical Activity: Not on file   Stress: Not on file   Social Connections: Not on file   Intimate Partner Violence: Not on file   Depression: Not on file   Housing Stability: Not on file       Review of Systems:   Complains of left elbow pain      Vital Signs:    Last 24hrs VS reviewed since prior progress note. Most recent are:  Vitals:    11/10/21 0400   BP: 125/71   Pulse: 70   Resp: 16   Temp: 98 F (36.7 C)   SpO2: 100%         Intake/Output Summary (Last 24 hours)  at 11/10/2021 0805  Last data filed at 11/09/2021 2030  Gross per 24 hour   Intake 210 ml   Output --   Net 210 ml        Physical Examination:     I had a face to face encounter with this patient and independently examined them on 11/10/2021 as outlined below:          General : alert x 3, awake, no acute distress, restless  HEENT: PEERL, EOMI, moist mucus membrane  Neck: supple, no JVD  Chest: Clear to auscultation bilaterally   CVS: S1 S2 heard, Capillary refill less than 2 seconds  Abd: soft/non tender, non distended, BS physiological,   Ext: no clubbing, no cyanosis, no edema, brisk 2+ DP pulses  Neuro/Psych: pleasant mood and affect, CN 2-12 grossly intact, sensory grossly within normal limit, Strength 5/5 in all extremities  Skin: warm, left elbow with swelling and erythema - foam dressing in place with seropurulent drainage    Data Review:    Review and/or order of clinical lab test  Review and/or order of tests in the radiology section of CPT  Review and/or order of tests in the medicine section of CPT      I have personally and independently reviewed all pertinent labs, diagnostic studies, imaging, and have provided independent interpretation of the same.     Labs:     Recent Labs     11/09/21  0532 11/10/21  0626   WBC 8.2 6.9   HGB 14.1 12.0   HCT 43.7 37.7   PLT 133* 138*       Recent Labs     11/08/21  1301 11/09/21  0532 11/10/21  0626   NA 135* 141 137   K 3.3* 4.3 4.2   CL 105 110* 109*   CO2 22 25 24    BUN  9 10 10    MG 2.1  --   --        Recent Labs     11/08/21  1301   ALT 28   GLOB 4.1*       No results for input(s): INR, APTT in the last 72 hours.    Invalid input(s): PTP   No results for input(s): TIBC, FERR in the last 72 hours.    Invalid input(s): FE, PSAT   No results found for: FOL, RBCF   No results for input(s): PH, PCO2, PO2 in the last 72 hours.  No results for input(s): CPK in the last 72 hours.    Invalid input(s): CPKMB, CKNDX, TROIQ  No results found for: CHOL, CHOLX, CHLST, CHOLV,  HDL, HDLC, LDL, LDLC, TGLX, TRIGL  No results found for: GLUCPOC    Notes reviewed from all clinical/nonclinical/nursing services involved in patient's clinical care. Care coordination discussions were held with appropriate clinical/nonclinical/ nursing providers based on care coordination needs.     Patients current active Medications were reviewed, considered, added and adjusted based on the clinical condition today.      Home Medications were reconciled to the best of my ability given all available resources at the time of admission. Route is PO if not otherwise noted.    Medications Reviewed:     Current Facility-Administered Medications   Medication Dose Route Frequency    oxyCODONE (ROXICODONE) immediate release tablet 5 mg  5 mg Oral Q4H PRN    oxyCODONE (ROXICODONE) immediate release tablet 10 mg  10 mg Oral Q4H PRN    gabapentin (NEURONTIN) tablet 600 mg  600 mg Oral BID    lactulose (CHRONULAC) 10 GM/15ML solution 20 g  20 g Oral BID    cetirizine (ZYRTEC) tablet 10 mg  10 mg Oral Daily    rifAXIMin (XIFAXAN) tablet 400 mg  400 mg Oral TID    thiamine tablet 100 mg  100 mg Oral Daily    sodium chloride flush 0.9 % injection 5-40 mL  5-40 mL IntraVENous 2 times per day    sodium chloride flush 0.9 % injection 5-40 mL  5-40 mL IntraVENous PRN    0.9 % sodium chloride infusion   IntraVENous PRN    ondansetron (ZOFRAN-ODT) disintegrating tablet 4 mg  4 mg Oral Q8H PRN    Or    ondansetron (ZOFRAN) injection 4 mg  4 mg IntraVENous Q6H PRN    polyethylene glycol (GLYCOLAX) packet 17 g  17 g Oral Daily PRN    acetaminophen (TYLENOL) tablet 650 mg  650 mg Oral Q6H PRN    Or    acetaminophen (TYLENOL) suppository 650 mg  650 mg Rectal Q6H PRN    piperacillin-tazobactam (ZOSYN) 3,375 mg in sodium chloride 0.9 % 50 mL IVPB (mini-bag)  3,375 mg IntraVENous Q8H    Vancomycin- Pharmacy to Dose   Other RX Placeholder    vancomycin (VANCOCIN) 1,000 mg in sodium chloride 0.9 % 250 mL IVPB (Vial2Bag)  1,000 mg IntraVENous  Q12H    nicotine (NICODERM CQ) 21 MG/24HR 1 patch  1 patch TransDERmal Daily    tetanus & diphtheria toxoids (adult) 5-2 LFU injection 0.5 mL  0.5 mL IntraMUSCular Once     ______________________________________________________________________  EXPECTED LENGTH OF STAY: Unable to retrieve estimated LOS  ACTUAL LENGTH OF STAY:          2                 Leanora Murin  Babs Bertin, APRN - NP

## 2021-11-11 LAB — BASIC METABOLIC PANEL
Anion Gap: 6 mmol/L (ref 5–15)
BUN: 11 MG/DL (ref 6–20)
Bun/Cre Ratio: 11 — ABNORMAL LOW (ref 12–20)
CO2: 23 mmol/L (ref 21–32)
Calcium: 8.6 MG/DL (ref 8.5–10.1)
Chloride: 107 mmol/L (ref 97–108)
Creatinine: 0.98 MG/DL (ref 0.55–1.02)
Est, Glom Filt Rate: >60 mL/min/{1.73_m2} (ref >60)
Glucose: 79 mg/dL (ref 65–100)
Potassium: 4.1 mmol/L (ref 3.5–5.1)
Sodium: 136 mmol/L (ref 136–145)

## 2021-11-11 LAB — CBC WITH AUTO DIFFERENTIAL
Absolute Immature Granulocyte: 0 10*3/uL
Basophils %: 1 % (ref 0–1)
Basophils Absolute: 0.1 10*3/uL (ref 0.0–0.1)
Eosinophils %: 0 % (ref 0–7)
Eosinophils Absolute: 0 10*3/uL (ref 0.0–0.4)
Hematocrit: 34.9 % — ABNORMAL LOW (ref 35.0–47.0)
Hemoglobin: 11.2 g/dL — ABNORMAL LOW (ref 11.5–16.0)
Immature Granulocytes: 0 %
Lymphocytes %: 25 % (ref 12–49)
Lymphocytes Absolute: 1.7 10*3/uL (ref 0.8–3.5)
MCH: 31.2 PG (ref 26.0–34.0)
MCHC: 32.1 g/dL (ref 30.0–36.5)
MCV: 97.2 FL (ref 80.0–99.0)
MPV: 11.1 FL (ref 8.9–12.9)
Monocytes %: 13 % (ref 5–13)
Monocytes Absolute: 0.9 10*3/uL (ref 0.0–1.0)
Neutrophils %: 61 % (ref 32–75)
Neutrophils Absolute: 4.2 10*3/uL (ref 1.8–8.0)
Nucleated RBCs: 0 PER 100 WBC
Platelets: 135 10*3/uL — ABNORMAL LOW (ref 150–400)
RBC: 3.59 M/uL — ABNORMAL LOW (ref 3.80–5.20)
RDW: 13.3 % (ref 11.5–14.5)
WBC: 6.9 10*3/uL (ref 3.6–11.0)
nRBC: 0 10*3/uL (ref 0.00–0.01)

## 2021-11-11 LAB — VANCOMYCIN LEVEL, RANDOM: Vancomycin Rm: 22.7 UG/ML

## 2021-11-11 LAB — AMMONIA: Ammonia: 13 umol/L (ref <32)

## 2021-11-11 MED ORDER — SODIUM CHLORIDE 0.9 % IV BOLUS
0.9 % | Freq: Once | INTRAVENOUS | Status: AC
Start: 2021-11-11 — End: 2021-11-11
  Administered 2021-11-11: 14:00:00 500 mL via INTRAVENOUS

## 2021-11-11 MED ORDER — VANCOMYCIN INTERMITTENT DOSING (PLACEHOLDER)
Freq: Once | INTRAVENOUS | Status: DC
Start: 2021-11-11 — End: 2021-11-11

## 2021-11-11 MED ORDER — AMOXICILLIN-POT CLAVULANATE 875-125 MG PO TABS
875-125 MG | Freq: Two times a day (BID) | ORAL | Status: DC
Start: 2021-11-11 — End: 2021-11-12
  Administered 2021-11-11 – 2021-11-12 (×3): 1 via ORAL

## 2021-11-11 MED ORDER — SODIUM CHLORIDE 0.9 % IV SOLN
0.9 % | INTRAVENOUS | Status: DC
Start: 2021-11-11 — End: 2021-11-12
  Administered 2021-11-11: 20:00:00 via INTRAVENOUS

## 2021-11-11 MED FILL — XIFAXAN 200 MG PO TABS: 200 MG | ORAL | Qty: 2

## 2021-11-11 MED FILL — LACTULOSE 10 GM/15ML PO SOLN: 10 GM/15ML | ORAL | Qty: 30

## 2021-11-11 MED FILL — AMOXICILLIN-POT CLAVULANATE 875-125 MG PO TABS: 875-125 MG | ORAL | Qty: 1

## 2021-11-11 MED FILL — OXYCODONE HCL 5 MG PO TABS: 5 MG | ORAL | Qty: 2

## 2021-11-11 MED FILL — VANCOMYCIN INTERMITTENT DOSING (PLACEHOLDER): INTRAVENOUS | Qty: 1

## 2021-11-11 MED FILL — SODIUM CHLORIDE 0.9 % IV SOLN: 0.9 % | INTRAVENOUS | Qty: 1000

## 2021-11-11 MED FILL — NICOTINE 21 MG/24HR TD PT24: 21 MG/24HR | TRANSDERMAL | Qty: 1

## 2021-11-11 MED FILL — CETIRIZINE HCL 10 MG PO TABS: 10 MG | ORAL | Qty: 1

## 2021-11-11 MED FILL — KETOROLAC TROMETHAMINE 30 MG/ML IJ SOLN: 30 MG/ML | INTRAMUSCULAR | Qty: 1

## 2021-11-11 MED FILL — GABAPENTIN 600 MG PO TABS: 600 MG | ORAL | Qty: 1

## 2021-11-11 MED FILL — SODIUM CHLORIDE 0.9 % IV SOLN: 0.9 % | INTRAVENOUS | Qty: 500

## 2021-11-11 MED FILL — THIAMINE HCL 100 MG PO TABS: 100 MG | ORAL | Qty: 1

## 2021-11-11 MED FILL — VANCOMYCIN HCL 1 G IV SOLR: 1 g | INTRAVENOUS | Qty: 1000

## 2021-11-11 MED FILL — PIPERACILLIN SOD-TAZOBACTAM SO 3.375 (3-0.375) G IV SOLR: 3.375 (3-0.375) g | INTRAVENOUS | Qty: 3375

## 2021-11-11 NOTE — Progress Notes (Cosign Needed)
[facility]                                                                                          Hospitalist Progress Note  Mike Gip, APRN - NP  Answering service: [phone] OR [phone] from in house phone        Date of Service:  11/11/2021  NAME:  Michelle Singleton  DOB:  14-Dec-1985  MRN:  [id number]       Admission Summary:   Michelle Singleton is a 36 y.o. female with liver cirrhosis, alcohol abuse, nicotine dependence, anxiety, neuropathy and HTN who presented to the ED with a swollen and painful L elbow since [place] and [place] it on Monday 8/14. She went to urgent care about 2 days ago, had an xray which reportedly said there was a fracture and she was given a script for cephalexin but was unable to pick it up yesterday as [facility] had closed. She went to [facility] as it was not getting better and was advised to come to the ER. The L elbow is red, swollen, and draining yellow fluid and pus at the laceration site. She denies f/c/v /diarrhea/constipation/dysuria, CP, and SOB but has had some nausea. She has not taken any abx for this.     VS were stable in the ED. Lactic acid and WBC count were normal. CT L elbow showed olecranon bursitis with cellulitis but no evidence of septic arthritis or acute osteomyelitis.     Interval history / Subjective:      Patient seen this morning. Reported arm feeling better. Was preparing to discharge patient, but then patient's mother reported that patient was feeling dizzy. Spoke with patient - she reported dizziness and unsteady gait. Administered 500 ml NS bolus and checked ammonia (13), PT consulted.    Revisited patient this afternoon - reports ongoing dizziness, but notes improvement after receiving NS bolus. Noted to have continued unsteady gait. Patient's boyfriend reports slightly unsteady gait at baseline, but feels that it is worse today. Not yet evaluated by PT. Will start maintenance IVF now and monitor overnight. Will  need PT evaluation prior to discharge. Continue to monitor patient for alcohol withdrawal - patient reports drinking 3-4 beers occasionally.    Case Management has arranged new PCP appointment for patient on 8/24.  Assessment & Plan:     L olecranon bursitis with cellulitis and laceration s/p fall in the river  - CT L elbow 8/17 olecranon bursitis with cellulitis, no e/o septic arthritis or acute osteomyelitis  - L elbow laceration with seropurulent drainage   - Ortho consulted: left elbow bursa aspiration performed 8/19 with benign culture results, will need outpatient follow-up in 1-2 weeks  - left elbow bursa fluid culture (8/18): NGTD  - BC X2 (8/18): NGTD  - continue vancomycin and Zosyn -- discontinued 8/21, switched to Augmentin, will need to complete 10 day antibiotic course  - OT consulted: no skilled therapy needs  - PRN oxycodone, scheduled Toradol per Ortho  - WBC 6.9, pt afebrile    Dizziness  Unsteady gait  - received  500 ml NS bolus this morning, start maintenance IVF  - PT consulted  - fall precautions     Hypokalemia, resolved  - K 4.2  - monitor labs     HTN  - stable  - not on home medications  - monitor vitals     Liver cirrhosis with h/o hepatic encephalopathy  Thrombocytopenia  - continue home lactulose, rifaximin, and thiamine -- reports having stopped lactulose a year ago  - platelets 138, stable  - ammonia 13 (8/21)     Alcohol abuse  - monitor for alcohol withdrawal  - patient reports prior heavy alcohol use and rehab, now drinks 3-4 beers occasionally  - counseled on cessation  - CIWA scoring with PRN Ativan  - fall and seizure precautions     Anxiety  - stable  - no home anxiolytics     Neuropathy  - continue home gabapentin     Nicotine dependence  - continue nicotine patch  - counseled on cessation     Code status: Full  Prophylaxis: SCDs  Care Plan discussed with: patient, RN, Ortho  Anticipated Disposition: TBD  Inpatient  Cardiac monitoring: None     Principal Problem:    Olecranon  bursitis of left elbow  Active Problems:    Septic bursitis of elbow, left  Resolved Problems:    * No resolved hospital problems. *         Social Determinants of Health     Tobacco Use: High Risk    Smoking Tobacco Use: Every Day    Smokeless Tobacco Use: Never    Passive Exposure: Current   Alcohol Use: Not on file   Financial Resource Strain: Not on file   Food Insecurity: Not on file   Transportation Needs: Not on file   Physical Activity: Not on file   Stress: Not on file   Social Connections: Not on file   Intimate Partner Violence: Not on file   Depression: Not on file   Housing Stability: Not on file       Review of Systems:   Complains of left elbow pain and dizziness      Vital Signs:    Last 24hrs VS reviewed since prior progress note. Most recent are:  Vitals:    11/11/21 1400   BP:    Pulse: (!) 102   Resp:    Temp:    SpO2:        No intake or output data in the 24 hours ending 11/11/21 1606       Physical Examination:     I had a face to face encounter with this patient and independently examined them on 11/11/2021 as outlined below:          General : alert x 3, awake, no acute distress, restless  HEENT: PEERL, EOMI, moist mucus membrane  Neck: supple, no JVD  Chest: Clear to auscultation bilaterally   CVS: S1 S2 heard, Capillary refill less than 2 seconds  Abd: soft/non tender, non distended, BS physiological,   Ext: no clubbing, no cyanosis, no edema, brisk 2+ DP pulses  Neuro/Psych: pleasant mood and affect, CN 2-12 grossly intact, sensory grossly within normal limit, unsteady gait  Skin: warm, left elbow with swelling and erythema - foam dressing in place with scant seropurulent drainage    Data Review:    Review and/or order of clinical lab test  Review and/or order of tests in the radiology section of CPT  Review and/or order  of tests in the medicine section of CPT      I have personally and independently reviewed all pertinent labs, diagnostic studies, imaging, and have provided independent  interpretation of the same.     Labs:     Recent Labs     11/10/21  0626 11/11/21  0506   WBC 6.9 6.9   HGB 12.0 11.2*   HCT 37.7 34.9*   PLT 138* 135*       Recent Labs     11/09/21  0532 11/10/21  0626 11/11/21  0506   NA 141 137 136   K 4.3 4.2 4.1   CL 110* 109* 107   CO2 25 24 23    BUN 10 10 11        No results for input(s): ALT, TP, ALB, GLOB, GGT, AML in the last 72 hours.    Invalid input(s): SGOT, GPT, AP, TBIL, TBILI, AMYP, LPSE, HLPSE    No results for input(s): INR, APTT in the last 72 hours.    Invalid input(s): PTP   No results for input(s): TIBC, FERR in the last 72 hours.    Invalid input(s): FE, PSAT   No results found for: FOL, RBCF   No results for input(s): PH, PCO2, PO2 in the last 72 hours.  No results for input(s): CPK in the last 72 hours.    Invalid input(s): CPKMB, CKNDX, TROIQ  No results found for: CHOL, CHOLX, CHLST, CHOLV, HDL, HDLC, LDL, LDLC, TGLX, TRIGL  No results found for: GLUCPOC    Notes reviewed from all clinical/nonclinical/nursing services involved in patient's clinical care. Care coordination discussions were held with appropriate clinical/nonclinical/ nursing providers based on care coordination needs.     Patients current active Medications were reviewed, considered, added and adjusted based on the clinical condition today.      Home Medications were reconciled to the best of my ability given all available resources at the time of admission. Route is PO if not otherwise noted.    Medications Reviewed:     Current Facility-Administered Medications   Medication Dose Route Frequency    amoxicillin-clavulanate (AUGMENTIN) 875-125 MG per tablet 1 tablet  1 tablet Oral 2 times per day    LORazepam (ATIVAN) tablet 1 mg  1 mg Oral Q1H PRN    Or    LORazepam (ATIVAN) injection 1 mg  1 mg IntraVENous Q1H PRN    Or    LORazepam (ATIVAN) tablet 2 mg  2 mg Oral Q1H PRN    Or    LORazepam (ATIVAN) injection 2 mg  2 mg IntraVENous Q1H PRN    Or    LORazepam (ATIVAN) tablet 3 mg  3 mg  Oral Q1H PRN    Or    LORazepam (ATIVAN) injection 3 mg  3 mg IntraVENous Q1H PRN    Or    LORazepam (ATIVAN) tablet 4 mg  4 mg Oral Q1H PRN    Or    LORazepam (ATIVAN) injection 4 mg  4 mg IntraVENous Q1H PRN    oxyCODONE (ROXICODONE) immediate release tablet 5 mg  5 mg Oral Q4H PRN    oxyCODONE (ROXICODONE) immediate release tablet 10 mg  10 mg Oral Q4H PRN    gabapentin (NEURONTIN) tablet 600 mg  600 mg Oral BID    lactulose (CHRONULAC) 10 GM/15ML solution 20 g  20 g Oral BID    cetirizine (ZYRTEC) tablet 10 mg  10 mg Oral Daily    rifAXIMin (XIFAXAN) tablet 400  mg  400 mg Oral TID    thiamine tablet 100 mg  100 mg Oral Daily    sodium chloride flush 0.9 % injection 5-40 mL  5-40 mL IntraVENous 2 times per day    sodium chloride flush 0.9 % injection 5-40 mL  5-40 mL IntraVENous PRN    0.9 % sodium chloride infusion   IntraVENous PRN    ondansetron (ZOFRAN-ODT) disintegrating tablet 4 mg  4 mg Oral Q8H PRN    Or    ondansetron (ZOFRAN) injection 4 mg  4 mg IntraVENous Q6H PRN    polyethylene glycol (GLYCOLAX) packet 17 g  17 g Oral Daily PRN    acetaminophen (TYLENOL) tablet 650 mg  650 mg Oral Q6H PRN    Or    acetaminophen (TYLENOL) suppository 650 mg  650 mg Rectal Q6H PRN    nicotine (NICODERM CQ) 21 MG/24HR 1 patch  1 patch TransDERmal Daily    tetanus & diphtheria toxoids (adult) 5-2 LFU injection 0.5 mL  0.5 mL IntraMUSCular Once     ______________________________________________________________________  EXPECTED LENGTH OF STAY: Unable to retrieve estimated LOS  ACTUAL LENGTH OF STAY:          3                 Mike Gip, APRN - NP

## 2021-11-11 NOTE — Care Coordination-Inpatient (Signed)
Care Management Initial Assessment       RUR: 5%  Readmission? No  1st IM letter given? No Medicaid     The patient is independent, just recently moved from [facility] with her friend. Address listed is her mothers. The patient has been staying in hotels in the [place] end of [place] and is looking for a place to live. PCP appointment scheduled by CM specialist and place on AVS/discharge summary. No CM orders or needs at this time. CM gave a copy of Sondra Barges PCP list as well.       11/11/21 1156   Service Assessment   Patient Orientation Alert and Oriented   Cognition Alert   History Provided By Patient   Primary Caregiver Self   Accompanied By/Relationship Mother   Support Systems Parent;Spouse/Significant Other   Patient's Healthcare Decision Maker is: Legal Next of Kin   PCP Verified by CM No  (New PCP scheduled)   Prior Functional Level Independent in ADLs/IADLs   Current Functional Level Independent in ADLs/IADLs   Can patient return to prior living arrangement Yes   Ability to make needs known: Good   Family able to assist with home care needs: Yes   Would you like for me to discuss the discharge plan with any other family members/significant others, and if so, who? Yes  (Mother)   Condition of Participation: Discharge Planning   The Plan for Transition of Care is related to the following treatment goals: The patient is independent and plans to discharge back to a hotel in the [place] end of [place] with friend and mother to transport.     Estill Batten RN/CRM  [phone]

## 2021-11-11 NOTE — Plan of Care (Signed)
Problem: Discharge Planning  Goal: Discharge to home or other facility with appropriate resources  Outcome: Progressing     Problem: Pain  Goal: Verbalizes/displays adequate comfort level or baseline comfort level  Outcome: Progressing     Problem: Safety - Adult  Goal: Free from fall injury  Outcome: Progressing

## 2021-11-11 NOTE — Discharge Instructions (Addendum)
Discharge Instructions       PATIENT ID: Michelle Singleton  MRN: [id number]   DATE OF BIRTH: 1985/09/24    DATE OF ADMISSION: 11/08/2021   DATE OF DISCHARGE: 11/12/2021    PRIMARY CARE PROVIDER: Mariann Laster     ATTENDING PHYSICIAN: Rebeca Allegra, MD   DISCHARGING PROVIDER: Mike Gip, APRN - NP    To contact this individual call [phone] and ask the operator to page.   If unavailable ask to be transferred the Adult Hospitalist Department.    DISCHARGE DIAGNOSES left elbow cellulitis and bursitis    CONSULTATIONS: Orthopedic Surgery    PROCEDURES/SURGERIES: * No surgery found *    PENDING TEST RESULTS:   At the time of discharge the following test results are still pending: none    Follow-up Information       Follow up With Specialties Details Why Contact Info    Hughie Closs, MD Orthopedic Surgery Follow up Please call office to schedule follow-up in 1-2 weeks. [address]  [place] [address]  [phone]      Caryl Pina, MD Family Medicine Follow up Hospital follow up with NEW PCP scheduled for Thursday, August 24th, 2023 at 12:40 p.m.Please arrive 20 minutes prior to appointment. Provide Application, ID, [employer], Discharge   Summary, Bill with name on it, Proof of income for last 2 months. [address]  [place] [address]  [phone]              ADDITIONAL CARE RECOMMENDATIONS:   You are being discharged with Augmentin (antibiotics). You need to take the full course of antibiotic as prescribed. You should take a probiotic while on the antibiotic to minimize GI symptoms.  You are being prescribed oxycodone (pain medication). Take this as prescribed. You may take 1/2 tablet if needed for moderate pain. You should not drive or drink alcohol while taking this medication. Do not share this medication with anyone.  If you notice any increased warmth, redness, or drainage from elbow wound, please call your doctor.     DIET: regular diet    ACTIVITY:  activity as tolerated    WOUND CARE: you may keep a dry bandage on left elbow, change as needed if wound is draining    EQUIPMENT needed: none      DISCHARGE MEDICATIONS:   See Medication Reconciliation Form    It is important that you take the medication exactly as they are prescribed.   Keep your medication in the bottles provided by the pharmacist and keep a list of the medication names, dosages, and times to be taken in your wallet.   Do not take other medications without consulting your doctor.       NOTIFY YOUR PHYSICIAN FOR ANY OF THE FOLLOWING:   Fever over 101 degrees for 24 hours.   Chest pain, shortness of breath, fever, chills, nausea, vomiting, diarrhea, change in mentation, falling, weakness, bleeding. Severe pain or pain not relieved by medications.  Or, any other signs or symptoms that you may have questions about.      DISPOSITION:   X Home With: none   OT  PT  HH  RN       SNF/Inpatient Rehab/LTAC    Independent/assisted living    Hospice    Other:     CDMP Checked:   Yes X     PROBLEM LIST Updated:  Yes X       Signed:  Mike Gip, APRN - NP  11/12/2021  12:14 PM

## 2021-11-11 NOTE — Progress Notes (Signed)
Pharmacist Note - Vancomycin Dosing  Therapy day 4  Indication: L elbow bursitis with cellulitis  Current regimen: 1000 mg IV every 12 hours    Recent Labs     11/09/21  0532 11/10/21  0626 11/11/21  0506   WBC 8.2 6.9 6.9   CREATININE 0.78 0.65 0.98   BUN 10 10 11        A random vancomycin level of 22.7 mcg/mL was obtained and from this level, the patient's AUC24 is calculated to be 939 with the current regimen.     Goal target range Trough 10-15 mcg/mL      Plan: Will discontinue maintenance regimen given acute increase in Scr and supratherapeutic vancomycin level drawn this morning. Will change to dosing by levels for now. Plan to re-dose once serum level <15 mcg/mL. Plan for random level tomorrow at 04:00 (~23 hours post dose). Pharmacy will continue to monitor this patient daily for changes in clinical status and renal function.

## 2021-11-11 NOTE — Progress Notes (Signed)
Hospital follow-up PCP transitional care appointment has been scheduled with Dr. Fuller Mandril at [facility] location for Thursday, August 24th, 2023  at 12:40 p.m.Marland Kitchen Please arrive 20 minutes prior to appointment. Provide Application, ID, [employer], Discharge   Summary, Bill with name on it, Proof of income for last 2 months.  Pending patient discharge.  Patria Mane, Care Management Assistant

## 2021-11-11 NOTE — Progress Notes (Signed)
Ortho Daily Progress Note      Patient: Michelle Singleton                   MRN: [id number]  Sex: female  Date of Birth: 10-09-1985           Age: 36 y.o.    Left elbow cellulitis/bursitis    Subjective: left elbow feels about the same, feels somewhat lightheaded/dizzy     Vitals:    11/11/21 1219   BP:    Pulse: 89   Resp:    Temp:    SpO2:         Lab Results:  Hemoglobin   Date/Time Value Ref Range Status   11/11/2021 05:06 AM 11.2 (L) 11.5 - 16.0 g/dL Final       Physical Exam:  GENERAL: 35yo female,alert, appears stated age, cooperative, and no distress, family at bedside  DRESSING:  dressing in place over proximal left forearm  abrasion  MOTION: moving left elbow well without restriction  SWELLING: mild swelling over left elbow, does not have significant bursal fluid collection today  NEUROLOGICAL: motor/sensation intact left UE, using hand/wrist normally  VASCULAR: strong radial pulses   WOUND: dressing over abrasion, + wound drainage      Plan:    No indication for further aspiration or surgical intervention. OK for discharge home on oral antibiotics from orthopedic standpoint. Follow-up with PCP in a week. Discussed with attending orthopedic surgeon, Dr. Hadassah Pais.      Idelle Leech, [place]  11/11/2021   12:33 PM

## 2021-11-12 LAB — CBC WITH AUTO DIFFERENTIAL
Absolute Immature Granulocyte: 0 10*3/uL
Basophils %: 0 % (ref 0–1)
Basophils Absolute: 0 10*3/uL (ref 0.0–0.1)
Eosinophils %: 0 % (ref 0–7)
Eosinophils Absolute: 0 10*3/uL (ref 0.0–0.4)
Hematocrit: 37.7 % (ref 35.0–47.0)
Hemoglobin: 11.9 g/dL (ref 11.5–16.0)
Immature Granulocytes: 0 %
Lymphocytes %: 42 % (ref 12–49)
Lymphocytes Absolute: 2.6 10*3/uL (ref 0.8–3.5)
MCH: 30.7 PG (ref 26.0–34.0)
MCHC: 31.6 g/dL (ref 30.0–36.5)
MCV: 97.4 FL (ref 80.0–99.0)
Monocytes %: 8 % (ref 5–13)
Monocytes Absolute: 0.5 10*3/uL (ref 0.0–1.0)
Neutrophils %: 50 % (ref 32–75)
Neutrophils Absolute: 3.1 10*3/uL (ref 1.8–8.0)
Nucleated RBCs: 0 PER 100 WBC
Platelets: 187 10*3/uL (ref 150–400)
RBC: 3.87 M/uL (ref 3.80–5.20)
RDW: 13.3 % (ref 11.5–14.5)
WBC Comment: REACTIVE
WBC: 6.2 10*3/uL (ref 3.6–11.0)
nRBC: 0 10*3/uL (ref 0.00–0.01)

## 2021-11-12 LAB — BASIC METABOLIC PANEL
Anion Gap: 4 mmol/L — ABNORMAL LOW (ref 5–15)
BUN: 15 MG/DL (ref 6–20)
Bun/Cre Ratio: 14 (ref 12–20)
CO2: 25 mmol/L (ref 21–32)
Calcium: 8.5 MG/DL (ref 8.5–10.1)
Chloride: 109 mmol/L — ABNORMAL HIGH (ref 97–108)
Creatinine: 1.05 MG/DL — ABNORMAL HIGH (ref 0.55–1.02)
Est, Glom Filt Rate: >60 mL/min/{1.73_m2} (ref >60)
Glucose: 85 mg/dL (ref 65–100)
Potassium: 4.2 mmol/L (ref 3.5–5.1)
Sodium: 138 mmol/L (ref 136–145)

## 2021-11-12 LAB — VITAMIN B12 & FOLATE
Folate: 8.6 ng/mL (ref 5.0–21.0)
Vitamin B-12: 288 pg/mL (ref 193–986)

## 2021-11-12 LAB — FERRITIN: Ferritin: 108 NG/ML (ref 8–252)

## 2021-11-12 MED ORDER — OXYCODONE HCL 10 MG PO TABS
10 MG | ORAL_TABLET | Freq: Four times a day (QID) | ORAL | 0 refills | Status: AC | PRN
Start: 2021-11-12 — End: 2021-11-13
  Filled 2021-11-12: qty 4, 1d supply, fill #0

## 2021-11-12 MED ORDER — AMOXICILLIN-POT CLAVULANATE 875-125 MG PO TABS
875-125 MG | ORAL_TABLET | Freq: Two times a day (BID) | ORAL | 0 refills | Status: AC
Start: 2021-11-12 — End: 2021-11-19
  Filled 2021-11-12: qty 13, 6d supply, fill #0

## 2021-11-12 MED FILL — LACTULOSE 10 GM/15ML PO SOLN: 10 GM/15ML | ORAL | Qty: 30

## 2021-11-12 MED FILL — XIFAXAN 200 MG PO TABS: 200 MG | ORAL | Qty: 2

## 2021-11-12 MED FILL — CETIRIZINE HCL 10 MG PO TABS: 10 MG | ORAL | Qty: 1

## 2021-11-12 MED FILL — GABAPENTIN 600 MG PO TABS: 600 MG | ORAL | Qty: 1

## 2021-11-12 MED FILL — AMOXICILLIN-POT CLAVULANATE 875-125 MG PO TABS: 875-125 MG | ORAL | Qty: 1

## 2021-11-12 MED FILL — OXYCODONE HCL 5 MG PO TABS: 5 MG | ORAL | Qty: 2

## 2021-11-12 MED FILL — THIAMINE HCL 100 MG PO TABS: 100 MG | ORAL | Qty: 1

## 2021-11-12 MED FILL — NICOTINE 21 MG/24HR TD PT24: 21 MG/24HR | TRANSDERMAL | Qty: 1

## 2021-11-12 NOTE — Progress Notes (Signed)
Patient being discharged. Reviewed discharge paperwork with patient and provided time for questions. Patient discharged with D/C paperwork and personal belongings.

## 2021-11-12 NOTE — Discharge Summary (Cosign Needed)
Discharge Summary       PATIENT ID: Michelle Singleton  MRN: [id number]   DATE OF BIRTH: Oct 30, 1985    DATE OF ADMISSION: 11/08/2021  2:03 PM    DATE OF DISCHARGE: 11/12/2021   PRIMARY CARE PROVIDER: Mariann Laster, MD     ATTENDING PHYSICIAN: Dr. Christie Beckers  DISCHARGING PROVIDER: Mike Gip, APRN - NP    To contact this individual call [phone] and ask the operator to page.  If unavailable ask to be transferred the Adult Hospitalist Department.    CONSULTATIONS: PHARMACY TO DOSE VANCOMYCIN  IP CONSULT TO ORTHOPEDIC SURGERY  IP CONSULT TO SOCIAL WORK    PROCEDURES/SURGERIES: * No surgery found *     ADMITTING DIAGNOSES & HOSPITAL COURSE:   Michelle Singleton is a 36 y.o. female with liver cirrhosis, alcohol abuse, nicotine dependence, anxiety, neuropathy and HTN who presented to the ED with a swollen and painful L elbow since [place] it on Monday 8[place]14. She went to urgent care about 2 days ago, had an xray which reportedly said there was a fracture and she was given a script for cephalexin but was unable [facility]. She went to [facility] as it was not getting better and was advised to come to the ER. The L elbow is red, swollen, and draining yellow fluid and pus at the laceration site. She denies f/c/v /diarrhea/constipation/dysuria, CP, and SOB but has had some nausea. She has not taken any abx for this.     VS were stable in the ED. Lactic acid and WBC count were normal. CT L elbow showed olecranon bursitis with cellulitis but no evidence of septic arthritis or acute osteomyelitis.     Patient was started on vancomycin and zosyn. She was evaluated by Orthopedic Surgery with left elbow bursa aspiration being performed on 8/19 - culture was benign, so no surgical intervention was required. She was transitioned to Augmentin on 8/21 as WBC was stable and she was afebrile. Due to complaints of dizziness and unsteady gait, she received IV fluid bolus and maintenance IV  fluids on 8/21 with a PT consult placed. On 8/22, she was able to ambulate around the unit without dizziness and unsteady gait, so PT evaluation was deferred. She was medically stable for discharge with continued Augmentin therapy and plans for outpatient Orthopedics follow-up. New PCP appointment was arranged for her prior to discharge.   DISCHARGE DIAGNOSES / PLAN:      L olecranon bursitis with cellulitis and laceration s/p fall in the river  - CT L elbow 8/17 olecranon bursitis with cellulitis, no e/o septic arthritis or acute osteomyelitis  - L elbow laceration with serous drainage   - Ortho consulted: left elbow bursa aspiration performed 8/19 with benign culture results, will need outpatient follow-up in 1-2 weeks  - left elbow bursa fluid culture (8/18): NGTD  - BC X2 (8/18): NGTD  - continue vancomycin and Zosyn -- discontinued 8/21, switched to Augmentin, will need to complete 10 day antibiotic course  - OT consulted: no skilled therapy needs  - PRN oxycodone, scheduled Toradol per Ortho  - WBC 6.2, pt afebrile     Dizziness, resolved  Unsteady gait, resolved  - observed on 8/21, ?related to dehydration - improved after NS bolus and maintenance IVF  - PT consulted: evaluation deferred on 8/22 as patient was able to independently ambulate around unit without      Hypokalemia, resolved  -  K 4.2  - monitor labs     HTN  - stable  - not on home medications  - monitor vitals     Liver cirrhosis with h/o hepatic encephalopathy  Thrombocytopenia  - continue home lactulose, rifaximin, and thiamine -- reports having stopped lactulose a year ago  - platelets 187, stable  - ammonia 13 (8/21)     Alcohol abuse  - monitor for alcohol withdrawal  - patient reports prior heavy alcohol use and rehab, now drinks 3-4 beers occasionally  - counseled on cessation  - CIWA scoring with PRN Ativan  - fall and seizure precautions     Anxiety  - stable  - no home anxiolytics     Neuropathy  - continue home gabapentin     Nicotine  dependence  - continue nicotine patch  - counseled on cessation       PENDING TEST RESULTS:   At the time of discharge the following test results are still pending: none    FOLLOW UP APPOINTMENTS:    Follow-up Information       Follow up With Specialties Details Why Contact Info    Hughie Closs, MD Orthopedic Surgery Follow up Please call office to schedule follow-up in 1-2 weeks. [address]  [place] [address]  [phone]      Caryl Pina, MD Family Medicine Follow up Hospital follow up with NEW PCP scheduled for Thursday, August 24th, 2023 at 12:40 p.m.Please arrive 20 minutes prior to appointment. Provide Application, ID, [employer], Discharge   Summary, Bill with name on it, Proof of income for last 2 months. [address]  [place] [address]  [phone]                ADDITIONAL CARE RECOMMENDATIONS:   You are being discharged with Augmentin (antibiotics). You need to take the full course of antibiotic as prescribed. You should take a probiotic while on the antibiotic to minimize GI symptoms.  You are being prescribed oxycodone (pain medication). Take this as prescribed. You may take 1/2 tablet if needed for moderate pain. You should not drive or drink alcohol while taking this medication. Do not share this medication with anyone.  If you notice any increased warmth, redness, or drainage from elbow wound, please call your doctor.     DIET: regular diet    ACTIVITY: activity as tolerated    WOUND CARE: you may keep a dry bandage on left elbow, change as needed if wound is draining    EQUIPMENT needed: none      DISCHARGE MEDICATIONS:     Medication List        START taking these medications      amoxicillin-clavulanate 875-125 MG per tablet  Commonly known as: AUGMENTIN  Take 1 tablet by mouth every 12 hours for 13 doses     oxyCODONE HCl 10 MG immediate release tablet  Commonly known as: OXY-IR  Take 1 tablet by mouth every 6 hours as needed for Pain for up to  1 day. Max Daily Amount: 40 mg            CONTINUE taking these medications      gabapentin 600 MG tablet  Commonly known as: NEURONTIN  TAKE 1 TABLET BY MOUTH TWO (2) TIMES A DAY. MAX DAILY AMOUNT: 2 TABS     lactulose 10 GM/15ML solution  Commonly known as: CHRONULAC     loratadine 10 MG tablet  Commonly known as: CLARITIN     rifAXIMin 200 MG tablet  Commonly known as: XIFAXAN     thiamine 100 MG tablet               Where to Get Your Medications        These medications were sent to [facility] - [place], [place] - [address] - P [phone] Carmon Ginsberg [id number]  [address], [place] [address]      Phone: [phone]   amoxicillin-clavulanate 875-125 MG per tablet  oxyCODONE HCl 10 MG immediate release tablet           NOTIFY YOUR PHYSICIAN FOR ANY OF THE FOLLOWING:   Fever over 101 degrees for 24 hours.   Chest pain, shortness of breath, fever, chills, nausea, vomiting, diarrhea, change in mentation, falling, weakness, bleeding. Severe pain or pain not relieved by medications.  Or, any other signs or symptoms that you may have questions about.    DISPOSITION:   X Home With: none   OT  PT  HH  RN       Long term SNF/Inpatient Rehab    Independent/assisted living    Hospice    Other:       PATIENT CONDITION AT DISCHARGE:     Functional status    Poor     Deconditioned    X Independent      Cognition    X Lucid     Forgetful     Dementia      Catheters/lines (plus indication)    Foley     PICC     PEG    X None      Code status    X Full code     DNR      PHYSICAL EXAMINATION AT DISCHARGE:  Patient seen and examined. Dizziness and unsteady gait resolved.     General : alert x 3, awake, no acute distress  HEENT: PEERL, EOMI, moist mucus membrane  Neck: supple, no JVD  Chest: Clear to auscultation bilaterally   CVS: S1 S2 heard, Capillary refill less than 2 seconds  Abd: soft/non tender, non distended, BS physiological  Ext: no clubbing, no cyanosis, no edema, brisk 2+ DP pulses  Neuro/Psych:  pleasant mood and affect, CN 2-12 grossly intact, sensory grossly within normal limit, Strength 5/5 in all extremities  Skin: warm, left elbow laceration with small amount of serous drainage    CHRONIC MEDICAL DIAGNOSES:  Principal Problem:    Olecranon bursitis of left elbow  Active Problems:    Septic bursitis of elbow, left  Resolved Problems:    * No resolved hospital problems. *        Greater than 31 minutes were spent with the patient on counseling and coordination of care    Signed:   Mike Gip, APRN - NP  11/12/2021  4:04 PM

## 2021-11-12 NOTE — Progress Notes (Signed)
PT order acknowledged and chart reviewed.  Pt discussed with Mike Gip - NP - regarding PT eval due to initial complaints of unsteadiness when ambulating.  Pt was observed ambulating in hallway - no assistive device or loss of balance.  Spoke with patient and NP who agree PT eval not longer indicated.    Will complete PT order -   Osborne Casco, PT

## 2021-11-12 NOTE — Progress Notes (Signed)
Spiritual Care Partner Volunteer visited patient at [facility] in [facility] on 11/12/2021   Documented by:  Audrielle Vankuren, MDIV, BCC

## 2021-11-13 LAB — CULTURE, BODY FLUID
Culture: NO GROWTH
Gram stain: NONE SEEN

## 2021-11-13 LAB — IRON AND TIBC
Iron % Saturation: 11 % — ABNORMAL LOW (ref 20–50)
Iron: 33 ug/dL — ABNORMAL LOW (ref 35–150)
TIBC: 292 ug/dL (ref 250–450)

## 2021-11-13 LAB — CULTURE, BLOOD 2: Culture: NO GROWTH

## 2021-11-13 LAB — CULTURE, BLOOD 1: Culture: NO GROWTH
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
## 2021-11-19 NOTE — Telephone Encounter (Signed)
[facility] Spoke w/ Maralyn Sago. She states pt last filled gabapentin [facility].

## 2021-11-21 ENCOUNTER — Encounter: Payer: MEDICAID | Attending: Orthopaedic Surgery | Primary: Internal Medicine
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
## 2021-11-28 ENCOUNTER — Encounter: Payer: MEDICAID | Attending: Orthopaedic Surgery | Primary: Internal Medicine

## 2022-03-03 ENCOUNTER — Encounter: Payer: MEDICAID | Attending: Neurology | Primary: Internal Medicine

## 2022-03-20 ENCOUNTER — Encounter

## 2022-03-20 NOTE — Telephone Encounter (Signed)
Patient calling for medication refill on gabapentin. She would like it sent to [place], [place], [place].  Thank you!

## 2022-03-21 MED ORDER — GABAPENTIN 600 MG PO TABS
600 MG | ORAL_TABLET | ORAL | 0 refills | Status: DC
Start: 2022-03-21 — End: 2022-04-02

## 2022-03-22 ENCOUNTER — Encounter

## 2022-04-02 ENCOUNTER — Encounter: Payer: MEDICAID | Attending: Neurology | Primary: Internal Medicine

## 2022-04-02 ENCOUNTER — Telehealth

## 2022-04-02 MED ORDER — GABAPENTIN 600 MG PO TABS
600 MG | ORAL_TABLET | ORAL | 5 refills | Status: AC
Start: 2022-04-02 — End: 2022-05-02

## 2022-04-02 NOTE — Telephone Encounter (Addendum)
Called pt. DOB verified. Inform pt that Dr. Lynett Fish has [facility]. Pt verbalizes understanding.

## 2022-04-02 NOTE — Telephone Encounter (Signed)
Patient calling to request refill on gabapentin. [facility]: [address] [place] [address]

## 2022-04-02 NOTE — Progress Notes (Deleted)
Neurology Progress Note    Patient ID:  Michelle Singleton  [id number]  36 y.o.  May 12, 1985      Subjective:   History:  Michelle Singleton is a  female who has  Anxiety and alcohol abuse since teenage yrs with liver cirrhosis (Drinks 8-10 shots of liquor, 1 bottle of wine and 3-6 hard llemonades daily) who comes in with cognitive issues. MMSE is 29/30. Consideration includes mild cognitive impairment due to history of alcohol abuse and current hepatic encephalopathy. Patient also has bilateral lower extremity paresthesias, weakness and difficulty walking and balancing. Was seeing a neurologist at [facility] Dr Lyndel Safe and was placed on Gabapentin 500 mg BID and  Cymbalta 30 mg BID (did not work) was added. Lyrica caused leg swelling. Also on Thiamine 100 mg every day. 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     Patient was last seen 06/25/2020.     Since last time, patient went back to drinking unfortunately.   Has not gotten her  Lactulose due to elevated ammonia for hepatic encephalopathy  Needs to go back to Thiamine        Neuropsychological testing (06/17/21)  This patient generated a predominantly normal range Neuropsychological Evaluation with respect to neurocognitive functioning.  In this regard, the generated invalid profile raises concern for chronic underlying ADHD type issue that is more mild in terms of severity.  She has functional memory problems.  Furthermore, her performances across all other neurocognitive domains assessed are fully normal.  From an emotional standpoint, there is strong support for severe anxiety and, to a lesser degree, depression.  She can be submissive and conforming in her relationships and others may try to take advantage of her.  There is a mixed and complex substance use disorder here.                   The generated neurocognitive test results are reassuring but the psychiatric/psychologic results are concerning.  The patient needs intensive psychiatric  care to include medication management for anxiety and depression along with active engagement in intensive psychotherapy.  I strongly suggested inpatient, residential, or partial day programming for her.  It is important to note that she is highly motivated for treatment.  She has competency/capacity at this time.  Thus, though she is submissive and conforming she has intact executive functions and is capable of making informed decisions for herself, even if those decisions are poor is viewed by an objective observer.  Consider treatment for attention deficit as well, but caution is advised in selecting an appropriate medication for attention for her, given the anxiety here.  With improvement in mood and addressing of her substance use disorder, her prognosis does improve.  Treatment is likely to be lengthy, arduous, difficult, with a higher probability of reversals.  Any impasse needs to be handled with particular care.                 I will discuss these findings with the patient when she follows up with me in the near future.  A follow up Neuropsychological Evaluation is indicated on a prn basis, especially if there are any cognitive and/or emotional changes.       DIAGNOSES:                         The Referral Diagnosis Of MCI - IS NOT SUPPORTED  Severe Anxiety                                                  Mixed Substance Use Disorder                                                  Major Depression                                                  ADHD, Combined, Mild, Chronic                                                                                Alcoholic Peripheral Neuropathy            ROS:  Per HPI-  Otherwise the remainder of ROS was negative    Social Hx  Social History     Socioeconomic History    Marital status: Single   Tobacco Use    Smoking status: Every Day     Current packs/day: 0.50     Types: Cigarettes     Passive exposure: Current     Smokeless tobacco: Never   Substance and Sexual Activity    Alcohol use: Yes    Drug use: Not Currently       Meds:  Current Outpatient Medications on File Prior to Visit   Medication Sig Dispense Refill    gabapentin (NEURONTIN) 600 MG tablet TAKE 1 TABLET BY MOUTH TWO (2) TIMES A DAY. MAX DAILY AMOUNT: 2 TABS 60 tablet 0    lactulose (CHRONULAC) 10 GM/15ML solution TAKE BY MOUTH TWICE A DAY      loratadine (CLARITIN) 10 MG tablet Take 1 tablet by mouth      rifAXIMin (XIFAXAN) 200 MG tablet Take 550 mg by mouth 2 times daily      thiamine 100 MG tablet Take 1 tablet by mouth daily       No current facility-administered medications on file prior to visit.       Imaging:    CT Results (recent):  @BSHSILASTIMGCAT    MRI Results (recent):  @BSHSILASTIMGCAT (OEV0350:0)@    IR Results (recent):  @BSHSILASTIMGCAT    VAS/US Results (recent):  @BSHSILASTIMGCAT (XFG1829:9)@    Reviewed records in [facility] and media tab today    Lab Review     No visits with results within 3 Month(s) from this visit.   Latest known visit with results is:   No results displayed because visit has over 200 results.            Objective:       Exam:  There were no vitals filed for this visit.  Gen: Awake, alert, follows commands  Appropriate appearance, normal speech.  Oriented to all spheres.  No visual field defect on confrontation exam.  Full eyes movement, with no nystagmus, no diplopia, no ptosis.  Normal gag and swallow.  All remaining cranial nerves were normal  Motor function: 5/5 in all extremities  Sensory: intact to LT, PP and JPS  DTRs ++ in all extremities, (-) Babinski  Good FTN and HTS   Gait: Normal    Assessment:     No diagnosis found.      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     Mild ADHD     MRI brain (06/29/19): Non-specific white matter changes      Plan:   1.  2.  3.    1. Advise to  quit drinking before we can consider ADHD meds  2. Trial of alpha lipoic acid 600 mg BID  3. Discussed neuropsychological testing  4. Continue Gabapentin 600 mg BID for foot pain  5. Advise to go back on Thiamine 100 mg every day   6. Follow up with GI regarding hepatic encephalopathy (also wants to see a local GI)      No follow-ups on file.           Lynnell Dike, MD  Diplomate, American Board of Psychiatry and Neurology  Diplomate, Neuromuscular Medicine  Diplomate, American Board of Electrodiagnostic Medicine

## 2022-05-21 ENCOUNTER — Ambulatory Visit: Admit: 2022-05-21 | Payer: MEDICAID | Attending: Neurology | Primary: Internal Medicine

## 2022-05-21 DIAGNOSIS — F902 Attention-deficit hyperactivity disorder, combined type: Secondary | ICD-10-CM

## 2022-05-21 MED ORDER — AMPHETAMINE-DEXTROAMPHETAMINE 10 MG PO TABS
10 | ORAL_TABLET | Freq: Every day | ORAL | 0 refills | Status: DC
Start: 2022-05-21 — End: 2022-07-03

## 2022-05-21 NOTE — Progress Notes (Signed)
Neurology Progress Note    Patient ID:  Michelle Singleton  [id number]  37 y.o.  11-09-1985      Subjective:   History:  Michelle Singleton is a  female who has  Anxiety and alcohol abuse since teenage yrs with liver cirrhosis (Drinks 8-10 shots of liquor, 1 bottle of wine and 3-6 hard llemonades daily) who comes in with cognitive issues. MMSE is 29/30. Consideration includes mild cognitive impairment due to history of alcohol abuse and current hepatic encephalopathy. Patient also has bilateral lower extremity paresthesias, weakness and difficulty walking and balancing. Was seeing a neurologist at [facility] Dr Lyndel Safe and was placed on Gabapentin 500 mg BID and  Cymbalta 30 mg BID (did not work) was added. Lyrica caused leg swelling. Also on Thiamine 100 mg every day. 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 Neuropsychological testing (06/17/21): WNL     Since last time, patient feels cognitive issue is getting worse.    Has cut back alcohol with some hiccups along the way.     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    Patient still takes Gabapentin 600 mg BID for bilateral lower limb paresthesias. Did not start alpha lipoic acid.      ROS:  Per HPI-  Otherwise the remainder of ROS was negative    Social Hx  Social History     Socioeconomic History    Marital status: Single   Tobacco Use    Smoking status: Every Day     Current packs/day: 0.50     Types: Cigarettes     Passive exposure: Current    Smokeless tobacco: Never   Substance and Sexual Activity    Alcohol use: Yes    Drug use: Not Currently       Meds:  Current Outpatient Medications on File Prior to Visit   Medication Sig Dispense Refill    escitalopram (LEXAPRO) 5 MG tablet Take 1 tablet by mouth daily      gabapentin (NEURONTIN) 600 MG tablet TAKE 1 TABLET BY MOUTH TWO (2) TIMES A DAY. MAX DAILY AMOUNT: 2 TABS 60 tablet 5    lactulose (CHRONULAC) 10 GM/15ML solution TAKE 30ML BY MOUTH TWICE A DAY      loratadine  (CLARITIN) 10 MG tablet Take 1 tablet by mouth      rifAXIMin (XIFAXAN) 200 MG tablet Take 550 mg by mouth 2 times daily      thiamine 100 MG tablet Take 1 tablet by mouth daily (Patient not taking: Reported on 05/21/2022)       No current facility-administered medications on file prior to visit.       Imaging:    CT Results (recent):  @BSHSILASTIMGCAT$ ID:[id number]    MRI Results (recent):  @BSHSILASTIMGCAT$ [id number]    IR Results (recent):  @BSHSILASTIMGCAT$ [id number]    VAS/US Results (recent):  @BSHSILASTIMGCAT$ ID:[id number]    Reviewed records in [facility] and media tab today    Lab Review     No visits with results within 3 Month(s) from this visit.   Latest known visit with results is:   No results displayed because visit has over 200 results.            Objective:       Exam:  Vitals:    05/21/22 1519   BP: 120/70   Pulse: 96   Temp: 97 F (36.1 C)   SpO2: 99%     Gen: Awake, alert, follows commands  Appropriate appearance,  normal speech.  Oriented to all spheres.  No visual field defect on confrontation exam.  Full eyes movement, with no nystagmus, no diplopia, no ptosis.  Normal gag and swallow.  All remaining cranial nerves were normal  Motor function: 5/5 in all extremities  Sensory: intact to LT, PP and JPS  Good FTN and HTS   Gait: Normal    Assessment:       ICD-10-CM    1. Attention-deficit hyperactivity disorder, combined type  F90.2 amphetamine-dextroamphetamine (ADDERALL, 10MG,) 10 MG tablet      2. Alcoholic polyneuropathy (HCC)  G62.1       3. 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     Mild ADHD     MRI brain (06/29/19): Non-specific white matter changes    Plan:   Since patient has cut back on alcohol, will start her on Aderrall 10 mg QD to help with attention. Patient to inform GI doctor to see if medication is okay.  2. Stress  importance of quitting alcohol. She acknowledges that it is hard to do  3. Continue Gabapentin 600 mg BID for foot pain  4. Continue Thiamine 100 mg every day   5. Follow up with GI regarding hepatic encephalopathy and problem with pancreas  6. Patient planning to apply for disability           Return in about 3 months (around 08/19/2022).           Lynnell Dike, MD  Diplomate, American Board of Psychiatry and Neurology  Diplomate, Neuromuscular Medicine  Diplomate, American Board of Electrodiagnostic Medicine

## 2022-05-22 NOTE — Telephone Encounter (Signed)
Requesting PA for medication:    Adderall - 10 mg tablet

## 2022-05-26 ENCOUNTER — Encounter: Payer: MEDICAID | Attending: Neurology | Primary: Internal Medicine

## 2022-05-30 ENCOUNTER — Encounter (INDEPENDENT_AMBULATORY_CARE_PROVIDER_SITE_OTHER): Payer: Self-pay | Admitting: Internal Medicine

## 2022-06-02 NOTE — Telephone Encounter (Signed)
[id number]     Form completed and faxed with 2 set of office notes 06/02/2022 awaiting response

## 2022-06-03 NOTE — Telephone Encounter (Signed)
[id number]       Medication has been approved: 06/02/2022-06/01/2023.      Letter in media  Nurse notified

## 2022-06-04 NOTE — Telephone Encounter (Signed)
Sent pt a mychart message of approval

## 2022-07-02 ENCOUNTER — Encounter

## 2022-07-02 NOTE — Telephone Encounter (Signed)
Patient requesting refill Adderall & Gabapentin, if possible      [facility]  [address]  (P) [phone]

## 2022-07-03 MED ORDER — AMPHETAMINE-DEXTROAMPHETAMINE 10 MG PO TABS
10 MG | ORAL_TABLET | Freq: Every day | ORAL | 0 refills | Status: DC
Start: 2022-07-03 — End: 2023-01-20

## 2022-07-03 MED ORDER — GABAPENTIN 600 MG PO TABS
600 MG | ORAL_TABLET | ORAL | 5 refills | Status: DC
Start: 2022-07-03 — End: 2023-02-05

## 2022-07-03 NOTE — Telephone Encounter (Signed)
Patient called back to change her pharmacy for her refill on Gabapentin and Adderall.    [facility] [address]

## 2022-08-28 ENCOUNTER — Encounter: Payer: MEDICAID | Attending: Neurology | Primary: Internal Medicine

## 2022-08-28 NOTE — Progress Notes (Unsigned)
Neurology Progress Note    Patient ID:  Michelle Singleton  [id number]  37 y.o.  November 30, 1985      Subjective:   History:  Michelle Singleton is a  female who has  Anxiety and alcohol abuse since teenage yrs with liver cirrhosis (Drinks 8-10 shots of liquor, 1 bottle of wine and 3-6 hard llemonades daily) who comes in with cognitive issues. MMSE is 29/30. Consideration includes mild cognitive impairment due to history of alcohol abuse and current hepatic encephalopathy. Patient also has bilateral lower extremity paresthesias, weakness and difficulty walking and balancing. Was seeing a neurologist at [facility] Dr Chales Abrahams and was placed on Gabapentin 500 mg BID and  Cymbalta 30 mg BID (did not work) was added. Lyrica caused leg swelling. Also on Thiamine 100 mg every day. 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 Neuropsychological testing (06/17/21): WNL     Since last time, patient feels cognitive issue is getting worse.     Has cut back alcohol with some hiccups along the way.      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     Patient still takes Gabapentin 600 mg BID for bilateral lower limb paresthesias. Did not start alpha lipoic acid.      ROS:  Per HPI-  Otherwise the remainder of ROS was negative    Social Hx  Social History     Socioeconomic History    Marital status: Single   Tobacco Use    Smoking status: Every Day     Current packs/day: 0.50     Types: Cigarettes     Passive exposure: Current    Smokeless tobacco: Never   Substance and Sexual Activity    Alcohol use: Yes    Drug use: Not Currently       Meds:  Current Outpatient Medications on File Prior to Visit   Medication Sig Dispense Refill    amphetamine-dextroamphetamine (ADDERALL, 10MG ,) 10 MG tablet Take 1 tablet by mouth daily for 30 days. Max Daily Amount: 10 mg 30 tablet 0    gabapentin (NEURONTIN) 600 MG tablet TAKE 1 TABLET BY MOUTH TWO (2) TIMES A DAY. MAX DAILY AMOUNT: 2 TABS 60 tablet 5    escitalopram  (LEXAPRO) 5 MG tablet Take 1 tablet by mouth daily      lactulose (CHRONULAC) 10 GM/15ML solution TAKE BY MOUTH TWICE A DAY      loratadine (CLARITIN) 10 MG tablet Take 1 tablet by mouth      rifAXIMin (XIFAXAN) 200 MG tablet Take 550 mg by mouth 2 times daily      thiamine 100 MG tablet Take 1 tablet by mouth daily (Patient not taking: Reported on 05/21/2022)       No current facility-administered medications on file prior to visit.       Imaging:    CT Results (recent):  @BSHSILASTIMGCAT (WGN5621:3)@    MRI Results (recent):  @BSHSILASTIMGCAT (YQM5784:6)@    IR Results (recent):  @BSHSILASTIMGCAT (NGE9528:4)@    VAS/US Results (recent):  @BSHSILASTIMGCAT (XLK4401:0)@    Reviewed records in [facility] and media tab today    Lab Review     No visits with results within 3 Month(s) from this visit.   Latest known visit with results is:   No results displayed because visit has over 200 results.            Objective:       Exam:  There were no vitals filed for this visit.  Gen: Awake, alert, follows commands  Appropriate appearance, normal speech.  Oriented to all spheres.  No visual field defect on confrontation exam.  Full eyes movement, with no nystagmus, no diplopia, no ptosis.  Normal gag and swallow.  All remaining cranial nerves were normal  Motor function: 5/5 in all extremities  Sensory: intact to LT, PP and JPS  DTRs ++ in all extremities, (-) Babinski  Good FTN and HTS   Gait: Normal    Assessment:     No diagnosis found.      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     Mild ADHD     MRI brain (06/29/19): Non-specific white matter changes      Plan:   1.  2.  3.      Since patient has cut back on alcohol, will start her on Aderrall 10 mg QD to help with attention. Patient to inform GI doctor to see if medication is okay.  2. Stress importance of quitting alcohol. She acknowledges that it is hard to  do  3. Continue Gabapentin 600 mg BID for foot pain  4. Continue Thiamine 100 mg every day   5. Follow up with GI regarding hepatic encephalopathy and problem with pancreas  6. Patient planning to apply for disability    No follow-ups on file.           Roxy Manns, MD  Diplomate, American Board of Psychiatry and Neurology  Diplomate, Neuromuscular Medicine  Diplomate, American Board of Electrodiagnostic Medicine

## 2022-09-18 ENCOUNTER — Emergency Department: Admit: 2022-09-18 | Payer: MEDICAID | Primary: Internal Medicine

## 2022-09-18 ENCOUNTER — Inpatient Hospital Stay
Admit: 2022-09-18 | Discharge: 2022-09-18 | Disposition: A | Discharge status: Home / Self Care | Payer: MEDICAID | Attending: Emergency Medicine

## 2022-09-18 DIAGNOSIS — K7460 Unspecified cirrhosis of liver: Principal | ICD-10-CM

## 2022-09-18 DIAGNOSIS — R1013 Epigastric pain: Secondary | ICD-10-CM

## 2022-09-18 LAB — URINALYSIS WITH REFLEX TO CULTURE
BACTERIA, URINE: NEGATIVE /hpf
Bilirubin, Urine: NEGATIVE
Blood, Urine: NEGATIVE
Glucose, Ur: NEGATIVE mg/dL
Leukocyte Esterase, Urine: NEGATIVE
Nitrite, Urine: NEGATIVE
Protein, UA: NEGATIVE mg/dL
Specific Gravity, UA: 1.019 (ref 1.003–1.030)
Urobilinogen, Urine: 1 EU/dL (ref 0.2–1.0)
pH, Urine: 5.5 (ref 5.0–8.0)

## 2022-09-18 LAB — CBC WITH AUTO DIFFERENTIAL
Basophils %: 0 % (ref 0–1)
Basophils Absolute: 0 10*3/uL (ref 0.0–0.1)
Eosinophils %: 1 % (ref 0–7)
Eosinophils Absolute: 0.1 10*3/uL (ref 0.0–0.4)
Hematocrit: 37.6 % (ref 35.0–47.0)
Hemoglobin: 13.4 g/dL (ref 11.5–16.0)
Immature Granulocytes %: 0 % (ref 0.0–0.5)
Immature Granulocytes Absolute: 0 10*3/uL (ref 0.00–0.04)
Lymphocytes %: 44 % (ref 12–49)
Lymphocytes Absolute: 2.5 10*3/uL (ref 0.8–3.5)
MCH: 33.6 PG (ref 26.0–34.0)
MCHC: 35.6 g/dL (ref 30.0–36.5)
MCV: 94.2 FL (ref 80.0–99.0)
MPV: 10.3 FL (ref 8.9–12.9)
Monocytes %: 11 % (ref 5–13)
Monocytes Absolute: 0.6 10*3/uL (ref 0.0–1.0)
Neutrophils %: 44 % (ref 32–75)
Neutrophils Absolute: 2.5 10*3/uL (ref 1.8–8.0)
Nucleated RBCs: 0 PER 100 WBC
Platelets: 167 10*3/uL (ref 150–400)
RBC: 3.99 M/uL (ref 3.80–5.20)
RDW: 12.9 % (ref 11.5–14.5)
WBC: 5.7 10*3/uL (ref 3.6–11.0)
nRBC: 0 10*3/uL (ref 0.00–0.01)

## 2022-09-18 LAB — PROTIME-INR
INR: 1 (ref 0.9–1.1)
Protime: 10.2 s (ref 9.0–11.1)

## 2022-09-18 LAB — POC PREGNANCY UR-QUAL
HCG, Urine, POC: NEGATIVE
Lot Number: 768897
Preg Test, Ur: NEGATIVE

## 2022-09-18 LAB — COMPREHENSIVE METABOLIC PANEL
ALT: 32 U/L (ref 12–78)
AST: 32 U/L (ref 15–37)
Albumin/Globulin Ratio: 0.9 — ABNORMAL LOW (ref 1.1–2.2)
Albumin: 3.7 g/dL (ref 3.5–5.0)
Alk Phosphatase: 63 U/L (ref 45–117)
Anion Gap: 5 mmol/L (ref 5–15)
BUN/Creatinine Ratio: 13 (ref 12–20)
BUN: 11 MG/DL (ref 6–20)
CO2: 25 mmol/L (ref 21–32)
Calcium: 9.2 MG/DL (ref 8.5–10.1)
Chloride: 107 mmol/L (ref 97–108)
Creatinine: 0.88 MG/DL (ref 0.55–1.02)
Est, Glom Filt Rate: 86 mL/min/{1.73_m2} (ref >60)
Globulin: 4 g/dL (ref 2.0–4.0)
Glucose: 97 mg/dL (ref 65–100)
Potassium: 4 mmol/L (ref 3.5–5.1)
Sodium: 137 mmol/L (ref 136–145)
Total Bilirubin: 0.6 MG/DL (ref 0.2–1.0)
Total Protein: 7.7 g/dL (ref 6.4–8.2)

## 2022-09-18 LAB — AMMONIA: Ammonia: 37 umol/L — ABNORMAL HIGH (ref <32)

## 2022-09-18 LAB — LIPASE: Lipase: 76 U/L — ABNORMAL HIGH (ref 13–75)

## 2022-09-18 LAB — ETHANOL: Ethanol Lvl: 44 MG/DL — ABNORMAL HIGH (ref <10)

## 2022-09-18 LAB — APTT: APTT: 23.2 s (ref 22.1–31.0)

## 2022-09-18 MED ORDER — IOPAMIDOL 76 % IV SOLN
76 | Freq: Once | INTRAVENOUS | Status: AC | PRN
Start: 2022-09-18 — End: 2022-09-18
  Administered 2022-09-18: 21:00:00 100 mL via INTRAVENOUS

## 2022-09-18 MED FILL — ISOVUE-370 76 % IV SOLN: 76 % | INTRAVENOUS | Qty: 100

## 2022-09-18 NOTE — ED Notes (Signed)
3:44 PM  I have evaluated the patient as the Provider in Rapid Medical Evaluation (RME). I have reviewed her vital signs and the triage nurse assessment. I have talked with the patient and any available family and advised that I am the provider in triage and have ordered the appropriate study to initiate their work up based on the clinical presentation during my assessment. I have advised that the patient will be accommodated in the Main ED as soon as possible. I have also requested to contact the triage nurse or myself immediately if the patient experiences any changes in their condition during this brief waiting period.    37 year old female with history of cirrhosis presents with complaint of abdominal pain for the past 10 days.  Daily alcohol drinker.  Denies associated vomiting or diarrhea.  Feels bloated.  History of paracentesis 3 years ago.    Yisroel Ramming, PA-C       Yisroel Ramming, PA-C  09/18/22 [phone]

## 2022-09-18 NOTE — ED Triage Notes (Signed)
Pt arrives ambulatory with CC lower mid abd pain x10 days. Pt has hx of cirrhosis & alcohol abuse. Denies vomiting, fevers, CP, SOB.

## 2022-09-18 NOTE — ED Provider Notes (Signed)
[facility] EMERGENCY DEP  EMERGENCY DEPARTMENT ENCOUNTER      Pt Name: Michelle Singleton  MRN: [id number]  Birthdate [birth year]  Date of evaluation: 09/18/2022  Provider: Percival Spanish, MD    CHIEF COMPLAINT       Chief Complaint   Patient presents with    Abdominal Pain         HISTORY OF PRESENT ILLNESS    Michelle Singleton is a 37 yo F with h/o alcohol abuse and cirrhosis who has had mid abdominal pain for the past 10 days.  She states she still continues to drink.  She denies vomiting, fevers, chest pain or shortness of breath.  She states she needed paracentesis 3 years ago and wonders if she needs it again.  She states she his followed by GI but has not been to a Hepatologist.  She states that her GI had recommended she see one.            Additional history from independent historians:     Review of External Medical Records:     Nursing Notes were reviewed.    REVIEW OF SYSTEMS       Review of Systems   Gastrointestinal:  Positive for abdominal distention and abdominal pain.       Except as noted above the remainder of the review of systems was reviewed and negative.       PAST MEDICAL HISTORY     Past Medical History:   Diagnosis Date    Hypertension     Liver disease          SURGICAL HISTORY     No past surgical history on file.      CURRENT MEDICATIONS       Previous Medications    AMPHETAMINE-DEXTROAMPHETAMINE (ADDERALL, 10MG ,) 10 MG TABLET    Take 1 tablet by mouth daily for 30 days. Max Daily Amount: 10 mg    ESCITALOPRAM (LEXAPRO) 5 MG TABLET    Take 1 tablet by mouth daily    GABAPENTIN (NEURONTIN) 600 MG TABLET    TAKE 1 TABLET BY MOUTH TWO (2) TIMES A DAY. MAX DAILY AMOUNT: 2 TABS    LACTULOSE (CHRONULAC) 10 GM/15ML SOLUTION    TAKE BY MOUTH TWICE A DAY    LORATADINE (CLARITIN) 10 MG TABLET    Take 1 tablet by mouth    RIFAXIMIN (XIFAXAN) 200 MG TABLET    Take 550 mg by mouth 2 times daily    THIAMINE 100 MG TABLET    Take 1 tablet by mouth daily       ALLERGIES     Patient has no known  allergies.    FAMILY HISTORY     No family history on file.       SOCIAL HISTORY       Social History     Socioeconomic History    Marital status: Single   Tobacco Use    Smoking status: Every Day     Current packs/day: 0.50     Types: Cigarettes     Passive exposure: Current    Smokeless tobacco: Never   Substance and Sexual Activity    Alcohol use: Yes    Drug use: Not Currently         PHYSICAL EXAM       ED Triage Vitals [09/18/22 1540]   BP Temp Temp Source Pulse Respirations SpO2 Height Weight - Scale   110/73 97.7 F (36.5 C) Oral 99 18 96 %  1.626 m (5\' 4" ) 58 kg (127 lb 13.9 oz)       Body mass index is 21.95 kg/m.    Physical Exam  Vitals and nursing note reviewed.   Constitutional:       General: She is not in acute distress.  HENT:      Head: Normocephalic and atraumatic.      Mouth/Throat:      Mouth: Mucous membranes are moist.   Eyes:      Extraocular Movements: Extraocular movements intact.      Conjunctiva/sclera: Conjunctivae normal.   Cardiovascular:      Rate and Rhythm: Normal rate.   Pulmonary:      Effort: Pulmonary effort is normal. No respiratory distress.   Abdominal:      General: There is no distension.      Tenderness: There is no abdominal tenderness. There is no guarding or rebound.   Musculoskeletal:         General: No deformity.      Cervical back: Normal range of motion.   Skin:     General: Skin is warm and dry.   Neurological:      General: No focal deficit present.      Mental Status: She is alert.         DIAGNOSTIC RESULTS     EKG: All EKG's are interpreted by the Emergency Department Physician listed in the interpretation in the absence of a cardiologist and may also be found below under Reassessment/ED Course.           RADIOLOGY:   Non-plain film images such as CT, Ultrasound and MRI are read by the radiologist. Plain radiographic images are visualized and preliminarily interpreted by the emergency physician with the below findings:    Interpretation per the Radiologist  below, if available at the time of this note:    CT ABDOMEN PELVIS W IV CONTRAST Additional Contrast? None   Final Result   No acute process. Cirrhosis and splenomegaly.      Electronically signed by Ansel Bong           LABS:  Labs Reviewed   COMPREHENSIVE METABOLIC PANEL - Abnormal; Notable for the following components:       Result Value    Albumin/Globulin Ratio 0.9 (*)     All other components within normal limits   LIPASE - Abnormal; Notable for the following components:    Lipase 76 (*)     All other components within normal limits   URINALYSIS WITH REFLEX TO CULTURE - Abnormal; Notable for the following components:    Ketones, Urine TRACE (*)     All other components within normal limits   AMMONIA - Abnormal; Notable for the following components:    Ammonia 37 (*)     All other components within normal limits   ETHANOL - Abnormal; Notable for the following components:    Ethanol Lvl 44 (*)     All other components within normal limits   CBC WITH AUTO DIFFERENTIAL   PROTIME-INR   APTT   EXTRA TUBES HOLD   POC PREGNANCY UR-QUAL   POC PREGNANCY UR-QUAL       All other labs were within normal range or not returned as of this dictation.    EMERGENCY DEPARTMENT COURSE and DIFFERENTIAL DIAGNOSIS/MDM:   Vitals:    Vitals:    09/18/22 1540   BP: 110/73   Pulse: 99   Resp: 18   Temp: 97.7 F (  36.5 C)   TempSrc: Oral   SpO2: 96%   Weight: 58 kg (127 lb 13.9 oz)   Height: 1.626 m (5\' 4" )           Medical Decision Making          REASSESSMENT      7:15 PM  Labs reviewed and lipase mildly elevated at 76.  ETOH 44.  Advised drinking cessation.  Follow-up with hepatology.     CRITICAL CARE TIME       CONSULTS:  None    PROCEDURES:  Unless otherwise noted below, none     Procedures        FINAL IMPRESSION      1. Abdominal pain, epigastric    2. Cirrhosis of liver without ascites, unspecified hepatic cirrhosis type [facility])          DISPOSITION/PLAN   DISPOSITION        PATIENT REFERRED TO:  Patrecia Pour, MD  [address]  [place] [address]  [phone]          Bryson Ha, MD  [address]  [address] [place] [address]  [phone]            DISCHARGE MEDICATIONS:  New Prescriptions    No medications on file         (Please note that portions of this note were completed with a voice recognition program.  Efforts were made to edit the dictations but occasionally words are mis-transcribed.)    Percival Spanish, MD (electronically signed)  Emergency Attending Physician            Jorje Guild, MD  09/18/22 [phone]

## 2022-09-19 NOTE — Progress Notes (Signed)
Patient called to schedule NP appointment. ED visit yesterday referred to Dr. Larose Kells for evaluation. Referred to [facility] for review and asked to advise when to schedule.

## 2022-09-23 NOTE — Progress Notes (Signed)
Patient called to schedule NP appt. ED visit 6/28. Referred to [facility] for review. Routine -any provider, per Selena Batten. Patient refused routine appt and hung up.

## 2022-10-18 ENCOUNTER — Inpatient Hospital Stay: Admit: 2022-10-18 | Discharge: 2022-10-19 | Disposition: A | Discharge status: Home / Self Care | Payer: MEDICAID

## 2022-10-18 DIAGNOSIS — R197 Diarrhea, unspecified: Secondary | ICD-10-CM

## 2022-10-18 NOTE — Discharge Instructions (Signed)
Discussed visit today. Please check MyChart in 24-48 hours for your results.     Return to the ER with any worsening of symptoms.

## 2022-10-18 NOTE — ED Triage Notes (Signed)
Patient ambulatory through triage with complaints of diarrhea for x1.5 weeks after she swam in ecoli infected water.

## 2022-10-19 LAB — URINALYSIS WITH REFLEX TO CULTURE
BACTERIA, URINE: NEGATIVE /hpf
Bilirubin, Urine: NEGATIVE
Blood, Urine: NEGATIVE
Glucose, Ur: NEGATIVE mg/dL
Ketones, Urine: NEGATIVE mg/dL
Leukocyte Esterase, Urine: NEGATIVE
Nitrite, Urine: NEGATIVE
Protein, UA: NEGATIVE mg/dL
Specific Gravity, UA: 1.007 (ref 1.003–1.030)
Urobilinogen, Urine: 0.2 EU/dL (ref 0.2–1.0)
pH, Urine: 5.5 (ref 5.0–8.0)

## 2022-10-19 LAB — COMPREHENSIVE METABOLIC PANEL
ALT: 26 U/L (ref 12–78)
AST: 19 U/L (ref 15–37)
Albumin/Globulin Ratio: 1 — ABNORMAL LOW (ref 1.1–2.2)
Albumin: 3.8 g/dL (ref 3.5–5.0)
Alk Phosphatase: 58 U/L (ref 45–117)
Anion Gap: 10 mmol/L (ref 5–15)
BUN/Creatinine Ratio: 15 (ref 12–20)
BUN: 12 MG/DL (ref 6–20)
CO2: 21 mmol/L (ref 21–32)
Calcium: 9.2 MG/DL (ref 8.5–10.1)
Chloride: 107 mmol/L (ref 97–108)
Creatinine: 0.8 MG/DL (ref 0.55–1.02)
Est, Glom Filt Rate: >90 mL/min/{1.73_m2} (ref >60)
Globulin: 3.8 g/dL (ref 2.0–4.0)
Glucose: 97 mg/dL (ref 65–100)
Potassium: 3.6 mmol/L (ref 3.5–5.1)
Sodium: 138 mmol/L (ref 136–145)
Total Bilirubin: 0.2 MG/DL (ref 0.2–1.0)
Total Protein: 7.6 g/dL (ref 6.4–8.2)

## 2022-10-19 LAB — CBC WITH AUTO DIFFERENTIAL
Basophils %: 1 % (ref 0–1)
Basophils Absolute: 0 10*3/uL (ref 0.0–0.1)
Eosinophils %: 2 % (ref 0–7)
Eosinophils Absolute: 0.1 10*3/uL (ref 0.0–0.4)
Hematocrit: 39.4 % (ref 35.0–47.0)
Hemoglobin: 13.7 g/dL (ref 11.5–16.0)
Immature Granulocytes %: 0 % (ref 0.0–0.5)
Immature Granulocytes Absolute: 0 10*3/uL (ref 0.00–0.04)
Lymphocytes %: 45 % (ref 12–49)
Lymphocytes Absolute: 3.1 10*3/uL (ref 0.8–3.5)
MCH: 32.9 PG (ref 26.0–34.0)
MCHC: 34.8 g/dL (ref 30.0–36.5)
MCV: 94.7 FL (ref 80.0–99.0)
MPV: 10.5 FL (ref 8.9–12.9)
Monocytes %: 8 % (ref 5–13)
Monocytes Absolute: 0.5 10*3/uL (ref 0.0–1.0)
Neutrophils %: 44 % (ref 32–75)
Neutrophils Absolute: 3 10*3/uL (ref 1.8–8.0)
Nucleated RBCs: 0 PER 100 WBC
Platelets: 179 10*3/uL (ref 150–400)
RBC: 4.16 M/uL (ref 3.80–5.20)
RDW: 12.5 % (ref 11.5–14.5)
WBC: 6.8 10*3/uL (ref 3.6–11.0)
nRBC: 0 10*3/uL (ref 0.00–0.01)

## 2022-10-19 LAB — MAGNESIUM: Magnesium: 1.9 mg/dL (ref 1.6–2.4)

## 2022-10-19 LAB — EXTRA TUBES HOLD

## 2022-10-19 LAB — POC PREGNANCY UR-QUAL: Preg Test, Ur: NEGATIVE

## 2022-10-20 LAB — ENTERIC BACT PANEL, DNA
CAMPYLOBACTER SPECIES, DNA: NEGATIVE
ENTEROTOXIGEN E COLI, DNA: NEGATIVE
P. SHIGELLOIDES, DNA: NEGATIVE
SALMONELLA SPECIES, DNA: NEGATIVE
SHIGA TOXIN PRODUCING, DNA: NEGATIVE
SHIGELLA SPECIES, DNA: NEGATIVE
VIBRIO SPECIES, DNA: NEGATIVE
Y. ENTEROCOLITICA, DNA: NEGATIVE

## 2022-10-22 LAB — OVA+PARASITE + GIARDIA: Giardia Ag, Stl: NEGATIVE

## 2022-10-30 ENCOUNTER — Inpatient Hospital Stay
Admit: 2022-10-30 | Discharge: 2022-10-30 | Disposition: A | Discharge status: Home / Self Care | Payer: MEDICAID | Attending: Student in an Organized Health Care Education/Training Program

## 2022-10-30 DIAGNOSIS — B349 Viral infection, unspecified: Secondary | ICD-10-CM

## 2022-10-30 LAB — CBC WITH AUTO DIFFERENTIAL
Basophils %: 0 % (ref 0–1)
Basophils Absolute: 0 10*3/uL (ref 0.0–0.1)
Eosinophils %: 1 % (ref 0–7)
Eosinophils Absolute: 0 10*3/uL (ref 0.0–0.4)
Hematocrit: 38.6 % (ref 35.0–47.0)
Hemoglobin: 13.2 g/dL (ref 11.5–16.0)
Immature Granulocytes %: 0 % (ref 0.0–0.5)
Immature Granulocytes Absolute: 0 10*3/uL (ref 0.00–0.04)
Lymphocytes %: 36 % (ref 12–49)
Lymphocytes Absolute: 2.4 10*3/uL (ref 0.8–3.5)
MCH: 32.8 PG (ref 26.0–34.0)
MCHC: 34.2 g/dL (ref 30.0–36.5)
MCV: 95.8 FL (ref 80.0–99.0)
MPV: 11.4 FL (ref 8.9–12.9)
Monocytes %: 8 % (ref 5–13)
Monocytes Absolute: 0.5 10*3/uL (ref 0.0–1.0)
Neutrophils %: 55 % (ref 32–75)
Neutrophils Absolute: 3.6 10*3/uL (ref 1.8–8.0)
Nucleated RBCs: 0 PER 100 WBC
Platelets: 227 10*3/uL (ref 150–400)
RBC: 4.03 M/uL (ref 3.80–5.20)
RDW: 12.6 % (ref 11.5–14.5)
WBC: 6.6 10*3/uL (ref 3.6–11.0)
nRBC: 0 10*3/uL (ref 0.00–0.01)

## 2022-10-30 LAB — COVID-19 & INFLUENZA COMBO
Rapid Influenza A By PCR: NOT DETECTED
Rapid Influenza B By PCR: NOT DETECTED
SARS-CoV-2, PCR: NOT DETECTED

## 2022-10-30 LAB — COMPREHENSIVE METABOLIC PANEL
ALT: 31 U/L (ref 12–78)
AST: 49 U/L — ABNORMAL HIGH (ref 15–37)
Albumin/Globulin Ratio: 0.9 — ABNORMAL LOW (ref 1.1–2.2)
Albumin: 3.7 g/dL (ref 3.5–5.0)
Alk Phosphatase: 68 U/L (ref 45–117)
Anion Gap: 6 mmol/L (ref 5–15)
BUN/Creatinine Ratio: 17 (ref 12–20)
BUN: 13 MG/DL (ref 6–20)
CO2: 23 mmol/L (ref 21–32)
Calcium: 9.3 MG/DL (ref 8.5–10.1)
Chloride: 105 mmol/L (ref 97–108)
Creatinine: 0.78 MG/DL (ref 0.55–1.02)
Est, Glom Filt Rate: >90 mL/min/{1.73_m2} (ref >60)
Globulin: 3.9 g/dL (ref 2.0–4.0)
Glucose: 95 mg/dL (ref 65–100)
Potassium: 4.4 mmol/L (ref 3.5–5.1)
Sodium: 134 mmol/L — ABNORMAL LOW (ref 136–145)
Total Bilirubin: 0.3 MG/DL (ref 0.2–1.0)
Total Protein: 7.6 g/dL (ref 6.4–8.2)

## 2022-10-30 LAB — EXTRA TUBES HOLD

## 2022-10-30 LAB — AMMONIA: Ammonia: 32 umol/L — ABNORMAL HIGH (ref <32)

## 2022-10-30 MED ORDER — ONDANSETRON HCL 4 MG/2ML IJ SOLN
4 | Freq: Once | INTRAMUSCULAR | Status: DC | PRN
Start: 2022-10-30 — End: 2022-10-30

## 2022-10-30 MED ORDER — SODIUM CHLORIDE 0.9 % IV BOLUS
0.9 | Freq: Once | INTRAVENOUS | Status: AC
Start: 2022-10-30 — End: 2022-10-30
  Administered 2022-10-30: 19:00:00 1000 mL via INTRAVENOUS

## 2022-10-30 MED ORDER — BENZONATATE 100 MG PO CAPS
100 | ORAL | Status: AC
Start: 2022-10-30 — End: 2022-10-30
  Administered 2022-10-30: 19:00:00 100 mg via ORAL

## 2022-10-30 MED ORDER — DICLOFENAC SODIUM 50 MG PO TBEC
50 MG | ORAL_TABLET | Freq: Two times a day (BID) | ORAL | 0 refills | Status: AC | PRN
Start: 2022-10-30 — End: 2023-02-11

## 2022-10-30 MED ORDER — BENZONATATE 100 MG PO CAPS
100 MG | ORAL_CAPSULE | Freq: Three times a day (TID) | ORAL | 0 refills | Status: DC | PRN
Start: 2022-10-30 — End: 2023-02-11

## 2022-10-30 MED FILL — BENZONATATE 100 MG PO CAPS: 100 MG | ORAL | Qty: 1

## 2022-10-30 MED FILL — SODIUM CHLORIDE 0.9 % IV SOLN: 0.9 % | INTRAVENOUS | Qty: 1000

## 2022-10-30 NOTE — ED Triage Notes (Signed)
Pt reports cough, nasal congestion, n/v and sore throat x4 days. Pt denies fever.

## 2022-10-30 NOTE — ED Provider Notes (Signed)
[facility] EMERGENCY DEP  EMERGENCY DEPARTMENT ENCOUNTER      Pt Name: Michelle Singleton  MRN: [id number]  Birthdate 1985-09-11  Date of evaluation: 10/30/2022  Provider: Graylin Shiver, PA    CHIEF COMPLAINT       Chief Complaint   Patient presents with    Cough         HISTORY OF PRESENT ILLNESS   (Location/Symptom, Timing/Onset, Context/Setting, Quality, Duration, Modifying Factors, Severity)  Note limiting factors.   37 y/o female presenting with complaint of shortness of breath.  The patient reports a 5-day history of cough and nasal congestion.  Associated symptoms include chills, subjective fever, sore throat, shortness of breath, chest pain, nausea, vomiting, diarrhea and lightheadedness.  She has tried multiple OTC medications without improvement.  No generalized bodyaches, headache or syncope.    The history is provided by the patient and the spouse.         Review of External Medical Records:     Nursing Notes were reviewed.    REVIEW OF SYSTEMS    (2-9 systems for level 4, 10 or more for level 5)     Review of Systems   Constitutional:  Positive for chills and fever (subjective).   HENT:  Positive for congestion and sore throat.    Respiratory:  Positive for cough and shortness of breath.    Cardiovascular:  Positive for chest pain.   Gastrointestinal:  Positive for diarrhea, nausea and vomiting.   Musculoskeletal:  Negative for arthralgias and myalgias.   Neurological:  Positive for light-headedness. Negative for syncope and headaches.       Except as noted above the remainder of the review of systems was reviewed and negative.       PAST MEDICAL HISTORY     Past Medical History:   Diagnosis Date    Hypertension     Liver disease          SURGICAL HISTORY     No past surgical history on file.      CURRENT MEDICATIONS       Discharge Medication List as of 10/30/2022  5:22 PM        CONTINUE these medications which have NOT CHANGED    Details   amphetamine-dextroamphetamine (ADDERALL, 10MG ,) 10 MG tablet Take 1  tablet by mouth daily for 30 days. Max Daily Amount: 10 mg, Disp-30 tablet, R-0Normal      gabapentin (NEURONTIN) 600 MG tablet TAKE 1 TABLET BY MOUTH TWO (2) TIMES A DAY. MAX DAILY AMOUNT: 2 TABS, Disp-60 tablet, R-5Normal      escitalopram (LEXAPRO) 5 MG tablet Take 1 tablet by mouth dailyHistorical Med      lactulose (CHRONULAC) 10 GM/15ML solution TAKE BY MOUTH TWICE A DAYHistorical Med      loratadine (CLARITIN) 10 MG tablet Take 1 tablet by mouthHistorical Med      rifAXIMin (XIFAXAN) 200 MG tablet Take 550 mg by mouth 2 times dailyHistorical Med      thiamine 100 MG tablet Take 1 tablet by mouth dailyHistorical Med             ALLERGIES     Patient has no known allergies.    FAMILY HISTORY     No family history on file.       SOCIAL HISTORY       Social History     Socioeconomic History    Marital status: Single   Tobacco Use    Smoking status: Every Day  Current packs/day: 0.50     Types: Cigarettes     Passive exposure: Current    Smokeless tobacco: Never   Substance and Sexual Activity    Alcohol use: Yes    Drug use: Not Currently           PHYSICAL EXAM    (up to 7 for level 4, 8 or more for level 5)     ED Triage Vitals   BP Temp Temp src Pulse Resp SpO2 Height Weight   -- -- -- -- -- -- -- --       There is no height or weight on file to calculate BMI.    Physical Exam  Vitals and nursing note reviewed.   Constitutional:       General: She is not in acute distress.     Appearance: She is not diaphoretic.   HENT:      Head: Normocephalic.   Eyes:      Extraocular Movements: Extraocular movements intact.      Conjunctiva/sclera: Conjunctivae normal.   Cardiovascular:      Rate and Rhythm: Normal rate and regular rhythm.      Heart sounds: Normal heart sounds.   Pulmonary:      Effort: Pulmonary effort is normal.      Breath sounds: Normal breath sounds.   Skin:     General: Skin is warm and dry.   Neurological:      Mental Status: She is alert.   Psychiatric:         Mood and Affect: Mood normal.          Behavior: Behavior normal.         DIAGNOSTIC RESULTS     EKG: All EKG's are interpreted by the Emergency Department Physician who either signs or Co-signs this chart in the absence of a cardiologist.        RADIOLOGY:   Non-plain film images such as CT, Ultrasound and MRI are read by the radiologist. Plain radiographic images are visualized and preliminarily interpreted by the emergency physician with the below findings:        Interpretation per the Radiologist below, if available at the time of this note:    No orders to display        LABS:  Labs Reviewed   COMPREHENSIVE METABOLIC PANEL - Abnormal; Notable for the following components:       Result Value    Sodium 134 (*)     AST 49 (*)     Albumin/Globulin Ratio 0.9 (*)     All other components within normal limits   AMMONIA - Abnormal; Notable for the following components:    Ammonia 32 (*)     All other components within normal limits   COVID-19 & INFLUENZA COMBO   CBC WITH AUTO DIFFERENTIAL   EXTRA TUBES HOLD       All other labs were within normal range or not returned as of this dictation.    EMERGENCY DEPARTMENT COURSE and DIFFERENTIAL DIAGNOSIS/MDM:   Vitals:    Vitals:    10/30/22 1404 10/30/22 1715   BP: 124/81 109/71   Pulse: 95 92   Resp: 18 18   Temp: 97.7 F (36.5 C) 98.2 F (36.8 C)   TempSrc: Tympanic Oral   SpO2: 97% 97%           Medical Decision Making  Amount and/or Complexity of Data Reviewed  Labs: ordered.      Presentation,  management, and disposition were discussed with the attending physician, Dr. Jens Som, who is in agreement with plan of care.      37 y/o female presenting with complaint of shortness of breath.  History and exam consistent with viral illness. The patient is well-appearing in no acute distress. Lungs clear bilaterally, patient afebrile and not hypoxic - low clinical suspicion for pneumonia. Covid and influenza tests negative. Plan is for discharge home with Rx for tessalon and diclofenac, with instructions  for PCP follow up if symptoms persist. Strict ED return precautions discussed and provided in writing at time of discharge. The patient verbalized understanding and agreement with this plan.         REASSESSMENT          CONSULTS:  None    PROCEDURES:  Unless otherwise noted below, none     Procedures      FINAL IMPRESSION      1. Viral illness    2. Acute cough          DISPOSITION/PLAN   DISPOSITION Decision To Discharge 10/30/2022 05:18:37 PM      PATIENT REFERRED TO:  [place]  [address]  [place] [address]  [phone]  Schedule an appointment as soon as possible for a visit   As needed if symptoms do not improve      [facility] EMERGENCY DEP  [address]  [place] [address]  [phone]  If symptoms worsen      DISCHARGE MEDICATIONS:  Discharge Medication List as of 10/30/2022  5:22 PM        START taking these medications    Details   benzonatate (TESSALON) 100 MG capsule Take 1 capsule by mouth every 8 hours as needed for Cough, Disp-20 capsule, R-0Normal      diclofenac (VOLTAREN) 50 MG EC tablet Take 1 tablet by mouth 2 times daily as needed (for Pain or Fever), Disp-20 tablet, R-0Normal               (Please note that portions of this note were completed with a voice recognition program.  Efforts were made to edit the dictations but occasionally words are mis-transcribed.)    Graylin Shiver, PA (electronically signed)  Emergency Attending Physician / Physician Assistant / Nurse Practitioner             Omar Person, [place]  10/30/22 [phone]

## 2023-01-20 ENCOUNTER — Encounter: Admit: 2023-01-20 | Payer: MEDICAID | Attending: Neurology | Primary: Internal Medicine

## 2023-01-20 DIAGNOSIS — K7682 Hepatic encephalopathy: Secondary | ICD-10-CM

## 2023-01-20 MED ORDER — AMPHETAMINE-DEXTROAMPHETAMINE 10 MG PO TABS
10 MG | ORAL_TABLET | Freq: Every day | ORAL | 0 refills | Status: DC
Start: 2023-01-20 — End: 2023-02-17

## 2023-01-20 NOTE — Progress Notes (Unsigned)
Neurology Progress Note    Patient ID:  Michelle Singleton  [id number]  37 y.o.  1985-04-08      Subjective:   History:  Michelle Singleton is a  female who has  Anxiety and alcohol abuse since teenage yrs with liver cirrhosis (Drinks 8-10 shots of liquor, 1 bottle of wine and 3-6 hard llemonades daily) who comes in with cognitive issues. MMSE is 29/30. Consideration includes mild cognitive impairment due to history of alcohol abuse and current hepatic encephalopathy. Patient also has bilateral lower extremity paresthesias, weakness and difficulty walking and balancing. Was seeing a neurologist at [facility] Dr Chales Abrahams and was placed on Gabapentin 500 mg BID and  Cymbalta 30 mg BID (did not work) was added. Lyrica caused leg swelling. Also on Thiamine 100 mg every day. 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 Neuropsychological testing (06/17/21): WNL     Since last time, patient feels cognitive issue is getting worse.     Has cut back alcohol with some hiccups along the way.      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     Patient still takes Gabapentin 600 mg BID for bilateral lower limb paresthesias. Did not start alpha lipoic acid.           ROS:  Per HPI-  Otherwise the remainder of ROS was negative    Social Hx  Social History     Socioeconomic History    Marital status: Single   Tobacco Use    Smoking status: Every Day     Current packs/day: 0.50     Types: Cigarettes     Passive exposure: Current    Smokeless tobacco: Never   Substance and Sexual Activity    Alcohol use: Yes    Drug use: Not Currently     Social Determinants of Health     Financial Resource Strain: High Risk (09/03/2021)    Received from [facility]    Overall Financial Resource Strain (CARDIA)     Difficulty of Paying Living Expenses: Very hard   Food Insecurity: Food Insecurity Present (09/03/2021)    Received from [facility]    Hunger Vital Sign     Worried About Running Out of Food in the Last Year:  Often true     Ran Out of Food in the Last Year: Often true   Transportation Needs: No Transportation Needs (09/03/2021)    Received from [facility] - Transportation     Lack of Transportation (Medical): No     Lack of Transportation (Non-Medical): No    Received from [facility]    Social Network    Received from [facility]    HITS   Housing Stability: High Risk (09/03/2021)    Received from [facility]    Housing Stability Vital Sign     Unable to Pay for Housing in the Last Year: No     Number of Places Lived in the Last Year: 0     Unstable Housing in the Last Year: Yes       Meds:  Current Outpatient Medications on File Prior to Visit   Medication Sig Dispense Refill    benzonatate (TESSALON) 100 MG capsule Take 1 capsule by mouth every 8 hours as needed for Cough 20 capsule 0    diclofenac (VOLTAREN) 50 MG EC tablet Take 1 tablet by mouth 2 times daily as needed (for Pain or Fever) 20  tablet 0    amphetamine-dextroamphetamine (ADDERALL, 10MG ,) 10 MG tablet Take 1 tablet by mouth daily for 30 days. Max Daily Amount: 10 mg 30 tablet 0    gabapentin (NEURONTIN) 600 MG tablet TAKE 1 TABLET BY MOUTH TWO (2) TIMES A DAY. MAX DAILY AMOUNT: 2 TABS 60 tablet 5    escitalopram (LEXAPRO) 5 MG tablet Take 1 tablet by mouth daily      lactulose (CHRONULAC) 10 GM/15ML solution TAKE BY MOUTH TWICE A DAY      loratadine (CLARITIN) 10 MG tablet Take 1 tablet by mouth      rifAXIMin (XIFAXAN) 200 MG tablet Take 550 mg by mouth 2 times daily      thiamine 100 MG tablet Take 1 tablet by mouth daily (Patient not taking: Reported on 05/21/2022)       No current facility-administered medications on file prior to visit.       Imaging:    CT Results (recent):  @BSHSILASTIMGCAT ([id number]:5)@    MRI Results (recent):  @BSHSILASTIMGCAT ([id number]:5)@    IR Results (recent):  @BSHSILASTIMGCAT ([id number]:9)@    VAS/US Results (recent):  @BSHSILASTIMGCAT ([id number]:4)@    Reviewed records in [facility] and media tab today    Lab  Review     Admission on 10/30/2022, Discharged on 10/30/2022   Component Date Value Ref Range Status    Sodium 10/30/2022 134 (L)  136 - 145 mmol/L Final    Potassium 10/30/2022 4.4  3.5 - 5.1 mmol/L Final    SPECIMEN HEMOLYZED, RESULTS MAY BE AFFECTED    Chloride 10/30/2022 105  97 - 108 mmol/L Final    CO2 10/30/2022 23  21 - 32 mmol/L Final    Anion Gap 10/30/2022 6  5 - 15 mmol/L Final    Glucose 10/30/2022 95  65 - 100 mg/dL Final    BUN 40/12/2723 13  6 - 20 MG/DL Final    Creatinine 36/64/4034 0.78  0.55 - 1.02 MG/DL Final    BUN/Creatinine Ratio 10/30/2022 17  12 - 20   Final    Est, Glom Filt Rate 10/30/2022 >90  >60 ml/min/1.35m2 Final    Comment:    Pediatric calculator link: https://www.kidney.org/professionals/kdoqi/gfr_calculatorped     These results are not intended for use in patients <71 years of age.     eGFR results are calculated without a race factor using  the 2021 CKD-EPI equation. Careful clinical correlation is recommended, particularly when comparing to results calculated using previous equations.  The CKD-EPI equation is less accurate in patients with extremes of muscle mass, extra-renal metabolism of creatinine, excessive creatine ingestion, or following therapy that affects renal tubular secretion.      Calcium 10/30/2022 9.3  8.5 - 10.1 MG/DL Final    Total Bilirubin 10/30/2022 0.3  0.2 - 1.0 MG/DL Final    ALT 74/25/9563 31  12 - 78 U/L Final    AST 10/30/2022 49 (H)  15 - 37 U/L Final    SPECIMEN HEMOLYZED, RESULTS MAY BE AFFECTED    Alk Phosphatase 10/30/2022 68  45 - 117 U/L Final    Total Protein 10/30/2022 7.6  6.4 - 8.2 g/dL Final    Albumin 87/56/4332 3.7  3.5 - 5.0 g/dL Final    Globulin 95/18/8416 3.9  2.0 - 4.0 g/dL Final    Albumin/Globulin Ratio 10/30/2022 0.9 (L)  1.1 - 2.2   Final    SARS-CoV-2, PCR 10/30/2022 Not detected  NOTD   Final    Not Detected results do not preclude SARS-CoV-2  infection and should not be used as the sole basis for patient management  decisions.Results must be combined with clinical observations, patient history, and epidemiological information.    Rapid Influenza A By PCR 10/30/2022 Not detected  NOTD   Final    Rapid Influenza B By PCR 10/30/2022 Not detected  NOTD   Final    Comment: Testing was performed using [employer] Liat SARS-CoV-2 and Influenza A/B nucleic acid assay.  This test is a multiplex Real-Time Reverse Transcriptase Polymerase Chain Reaction (RT-PCR) based in vitro diagnostic test intended for the qualitative detection of nucleic acids from SARS-CoV-2, Influenza A, and Influenza B in nasopharyngeal and nasal swab specimens for use under the FDA's Emergency Use Authorization (EUA) only.     Fact sheet for Patients: SalonClasses.at  Fact sheet for Healthcare Providers: https://www.fda.fov/media/142191/download      Ammonia 10/30/2022 32 (H)  <32 UMOL/L Final    Ammonia determinations are subject to marked lability.  Upon standing, ammonia levels increase rapidly due to red cell metabolism. Concentrations may more than double in plasma if sample is stored at room temperature for 6 hours.    WBC 10/30/2022 6.6  3.6 - 11.0 K/uL Final    RBC 10/30/2022 4.03  3.80 - 5.20 M/uL Final    Hemoglobin 10/30/2022 13.2  11.5 - 16.0 g/dL Final    Hematocrit 29/56/2130 38.6  35.0 - 47.0 % Final    MCV 10/30/2022 95.8  80.0 - 99.0 FL Final    MCH 10/30/2022 32.8  26.0 - 34.0 PG Final    MCHC 10/30/2022 34.2  30.0 - 36.5 g/dL Final    RDW 86/57/8469 12.6  11.5 - 14.5 % Final    Platelets 10/30/2022 227  150 - 400 K/uL Final    MPV 10/30/2022 11.4  8.9 - 12.9 FL Final    Nucleated RBCs 10/30/2022 0.0  0 PER 100 WBC Final    nRBC 10/30/2022 0.00  0.00 - 0.01 K/uL Final    Neutrophils % 10/30/2022 55  32 - 75 % Final    Lymphocytes % 10/30/2022 36  12 - 49 % Final    Monocytes % 10/30/2022 8  5 - 13 % Final    Eosinophils % 10/30/2022 1  0 - 7 % Final    Basophils % 10/30/2022 0  0 - 1 % Final    Immature Granulocytes %  10/30/2022 0  0.0 - 0.5 % Final    Neutrophils Absolute 10/30/2022 3.6  1.8 - 8.0 K/UL Final    Lymphocytes Absolute 10/30/2022 2.4  0.8 - 3.5 K/UL Final    Monocytes Absolute 10/30/2022 0.5  0.0 - 1.0 K/UL Final    Eosinophils Absolute 10/30/2022 0.0  0.0 - 0.4 K/UL Final    Basophils Absolute 10/30/2022 0.0  0.0 - 0.1 K/UL Final    Immature Granulocytes Absolute 10/30/2022 0.0  0.00 - 0.04 K/UL Final    Differential Type 10/30/2022 AUTOMATED    Final    Specimen HOld 10/30/2022 1RED 1BLU   Final    Comment: 10/30/2022 Add-on orders for these samples will be processed based on acceptable specimen integrity and analyte stability, which may vary by analyte.    Final         Objective:       Exam:  There were no vitals filed for this visit.  Gen: Awake, alert, follows commands  Appropriate appearance, normal speech.  Oriented to all spheres.  No visual field defect on confrontation exam.  Full eyes  movement, with no nystagmus, no diplopia, no ptosis.  Normal gag and swallow.  All remaining cranial nerves were normal  Motor function: 5/5 in all extremities  Sensory: intact to LT, PP and JPS  DTRs ++ in all extremities, (-) Babinski  Good FTN and HTS   Gait: Normal    Assessment:     No diagnosis found.      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     Mild ADHD     MRI brain (06/29/19): Non-specific white matter changes      Plan:   1.  2.  3.    Since patient has cut back on alcohol, will start her on Aderrall 10 mg QD to help with attention. Patient to inform GI doctor to see if medication is okay.  2. Stress importance of quitting alcohol. She acknowledges that it is hard to do  3. Continue Gabapentin 600 mg BID for foot pain  4. Continue Thiamine 100 mg every day   5. Follow up with GI regarding hepatic encephalopathy and problem with pancreas  6. Patient planning to apply for disability      No follow-ups on  file.           Roxy Manns, MD  Diplomate, American Board of Psychiatry and Neurology  Diplomate, Neuromuscular Medicine  Diplomate, American Board of Electrodiagnostic Medicine

## 2023-01-21 ENCOUNTER — Ambulatory Visit: Admit: 2023-01-21 | Discharge: 2023-01-21 | Payer: MEDICAID | Attending: Gastroenterology | Primary: Internal Medicine

## 2023-01-21 DIAGNOSIS — K7030 Alcoholic cirrhosis of liver without ascites: Secondary | ICD-10-CM

## 2023-01-21 MED ORDER — RIFAXIMIN 200 MG PO TABS
200 | ORAL_TABLET | Freq: Two times a day (BID) | ORAL | 0 refills | Status: AC
Start: 2023-01-21 — End: ?

## 2023-01-21 NOTE — Progress Notes (Unsigned)
ETOH excessive 20 minbottles per day.  Acute pancreatitis 06/2022.  Diarrhea reesolved with creaon.    ADHD  HE   Neuropathy    Check for EGD.    Dad no  Mom died anxiety, depression    Partner  No  1ppd  20 mini bottles  2-3 beers 2-7 times   No work.

## 2023-01-22 ENCOUNTER — Telehealth

## 2023-01-22 LAB — HEPATIC FUNCTION PANEL
ALT: 24 U/L (ref 12–78)
AST: 26 U/L (ref 15–37)
Albumin/Globulin Ratio: 1.4 (ref 1.1–2.2)
Albumin: 4.4 g/dL (ref 3.5–5.0)
Alk Phosphatase: 66 U/L (ref 45–117)
Bilirubin, Direct: 0.2 mg/dL (ref 0.0–0.2)
Globulin: 3.2 g/dL (ref 2.0–4.0)
Total Bilirubin: 0.6 mg/dL (ref 0.2–1.0)
Total Protein: 7.6 g/dL (ref 6.4–8.2)

## 2023-01-22 LAB — HEPATITIS B SURFACE ANTIBODY
Hep B S Ab Interp: REACTIVE — AB
Hep B S Ab: >1000 m[IU]/mL

## 2023-01-22 LAB — HEPATITIS B SURFACE ANTIGEN
Hep B S Ag Interp: NEGATIVE
Hepatitis B Surface Ag: 0.55 {index}

## 2023-01-22 LAB — PROTIME-INR
INR: 1 (ref 0.9–1.1)
Protime: 10.4 s (ref 9.0–11.1)

## 2023-01-22 LAB — BASIC METABOLIC PANEL
Anion Gap: 8 mmol/L (ref 2–12)
BUN/Creatinine Ratio: 9 — ABNORMAL LOW (ref 12–20)
BUN: 7 mg/dL (ref 6–20)
CO2: 26 mmol/L (ref 21–32)
Calcium: 9.1 mg/dL (ref 8.5–10.1)
Chloride: 104 mmol/L (ref 97–108)
Creatinine: 0.78 mg/dL (ref 0.55–1.02)
Est, Glom Filt Rate: >90 mL/min/{1.73_m2} (ref >60)
Glucose: 118 mg/dL — ABNORMAL HIGH (ref 65–100)
Potassium: 3.8 mmol/L (ref 3.5–5.1)
Sodium: 138 mmol/L (ref 136–145)

## 2023-01-22 LAB — IRON AND TIBC
Iron % Saturation: 60 % — ABNORMAL HIGH (ref 20–50)
Iron: 232 ug/dL — ABNORMAL HIGH (ref 35–150)
TIBC: 384 ug/dL (ref 250–450)

## 2023-01-22 LAB — FERRITIN: Ferritin: 33 ng/mL (ref 26–388)

## 2023-01-22 NOTE — Telephone Encounter (Signed)
-----   Message from Booker P sent at 01/22/2023 11:26 AM EDT -----  Regarding: Lab Specimen Problem  Hello,    Please see information below for details regarding a problem with samples received at [facility]:    Patient Name: Michelle Singleton, Michelle Singleton  Patient DOB: [birth year]  Test(s) affected: CBC w/Differential  Description of Problem: Clotted specimen    Please place a new order and Client Services will contact the patient for recollection.     If you have any questions, please call [phone] and a representative will assist you.    Thank you,    Environmental health practitioner

## 2023-01-23 ENCOUNTER — Ambulatory Visit: Payer: MEDICAID | Attending: Gastroenterology | Primary: Internal Medicine

## 2023-01-23 LAB — HEPATITIS B CORE ANTIBODY, TOTAL: Hep B Core Total Ab: NEGATIVE

## 2023-01-23 LAB — AFP WITH AFP-L3%: AFP (Alpha Fetoprotein): 1.7 ng/mL (ref 0.0–6.4)

## 2023-01-23 LAB — HEPATITIS A ANTIBODY, TOTAL: Hep A Total Ab: POSITIVE — AB

## 2023-01-23 LAB — HCV INTERPRETATION

## 2023-01-23 LAB — ALPHA-1-ANTITRYPSIN: A-1 Antitrypsin: 146 mg/dL (ref 100–188)

## 2023-01-23 LAB — HEPATITIS C AB, RFLX TO QT BY PCR: Hepatitis C Ab: NONREACTIVE {s_co_ratio}

## 2023-01-23 LAB — CERULOPLASMIN: Ceruloplasmin: 31.3 mg/dL (ref 19.0–39.0)

## 2023-01-27 LAB — PHOSPHATIDYLETHANOL, BLOOD
Phosphatidylethanol(PETH): POSITIVE — AB
Phosphatidylethanol, Blood: 518 ng/mL

## 2023-02-04 ENCOUNTER — Encounter

## 2023-02-05 MED ORDER — GABAPENTIN 600 MG PO TABS
600 | ORAL_TABLET | ORAL | 3 refills | Status: AC
Start: 2023-02-05 — End: 2023-05-08

## 2023-02-05 NOTE — Telephone Encounter (Signed)
 LOV: 01/20/23  Next appt: 07/21/23  Last refill: 07/03/22, 60 tablet w/ 5 refills

## 2023-02-12 ENCOUNTER — Encounter (INDEPENDENT_AMBULATORY_CARE_PROVIDER_SITE_OTHER): Payer: Self-pay | Admitting: Internal Medicine

## 2023-02-16 ENCOUNTER — Encounter: Admit: 2023-02-16 | Admitting: Neurology

## 2023-02-16 DIAGNOSIS — F902 Attention-deficit hyperactivity disorder, combined type: Secondary | ICD-10-CM

## 2023-02-16 NOTE — Telephone Encounter (Signed)
 HIPAA Verified (if caller is someone other than patient): yes       Reason for Call:  Refill    Medication Name:     amphetamine-dextroamphetamine (ADDERALL, 10MG ,) 10 MG tablet     Pharmacy (if different from pharmacy on file):   [facility] [phone] - [place]

## 2023-02-17 MED ORDER — AMPHETAMINE-DEXTROAMPHETAMINE 10 MG PO TABS
10 | ORAL_TABLET | Freq: Every day | ORAL | 0 refills | Status: AC
Start: 2023-02-17 — End: 2023-03-19

## 2023-02-17 NOTE — Telephone Encounter (Signed)
LOV: 01/20/23  Next appt: 07/21/23  Last refill: 01/20/23, 30 tablets w/ 0 refills

## 2023-03-03 LAB — CBC WITH AUTO DIFFERENTIAL
Basophils %: 1 %
Basophils Absolute: 0 10*3/uL (ref 0.0–0.2)
Eosinophils %: 2 %
Eosinophils Absolute: 0.1 10*3/uL (ref 0.0–0.4)
Hematocrit: 39.2 % (ref 34.0–46.6)
Hemoglobin: 13.2 g/dL (ref 11.1–15.9)
Immature Grans (Abs): 0 10*3/uL (ref 0.0–0.1)
Immature Granulocytes %: 0 %
Lymphocytes %: 44 %
Lymphocytes Absolute: 3.2 10*3/uL — ABNORMAL HIGH (ref 0.7–3.1)
MCH: 33.1 pg — ABNORMAL HIGH (ref 26.6–33.0)
MCHC: 33.7 g/dL (ref 31.5–35.7)
MCV: 98 fL — ABNORMAL HIGH (ref 79–97)
Monocytes %: 7 %
Monocytes Absolute: 0.5 10*3/uL (ref 0.1–0.9)
Neutrophils %: 46 %
Neutrophils Absolute: 3.4 10*3/uL (ref 1.4–7.0)
Platelets: 224 10*3/uL (ref 150–450)
RBC: 3.99 x10E6/uL (ref 3.77–5.28)
RDW: 12.6 % (ref 11.7–15.4)
WBC: 7.3 10*3/uL (ref 3.4–10.8)

## 2023-04-24 ENCOUNTER — Ambulatory Visit: Payer: MEDICAID | Attending: Gastroenterology | Primary: Internal Medicine

## 2023-06-16 ENCOUNTER — Ambulatory Visit: Payer: MEDICAID | Attending: Gastroenterology | Primary: Internal Medicine

## 2023-07-21 ENCOUNTER — Ambulatory Visit: Payer: Medicaid (Managed Care) | Attending: Neurology | Primary: Internal Medicine

## 2023-07-21 NOTE — Progress Notes (Unsigned)
 Neurology Progress Note    Patient ID:  Michelle Singleton  [id number]  37 y.o.  [birth year]      Subjective:   History:  Michelle Singleton is a  female who has  Anxiety and alcohol abuse since teenage yrs with liver cirrhosis (Drinks 8-10 shots of liquor, 1 bottle of wine and 3-6 hard llemonades daily) who comes in with cognitive issues. MMSE is 29/30. Consideration includes mild cognitive impairment due to history of alcohol abuse and current hepatic encephalopathy. Patient also has bilateral lower extremity paresthesias, weakness and difficulty walking and balancing. Was seeing a neurologist at [facility] and was placed on Gabapentin  500 mg BID and  Cymbalta 30 mg BID (did not work) was added. Lyrica caused leg swelling. Also on Thiamine  100 mg every day. 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 Neuropsychological testing (06/17/21): Chronic mild ADHD.     Last time, patient was placed on Adderall 10 mg QD with benefit, but ran out.     Still drinks 2-3 alcohol/ week. But trying to cut back.     Patient still takes Gabapentin  600 mg BID for bilateral lower limb paresthesias.      Has been off Thiamine .           ROS:  Per HPI-  Otherwise the remainder of ROS was negative    Social Hx  Social History     Socioeconomic History    Marital status: Single   Tobacco Use    Smoking status: Every Day     Current packs/day: 0.50     Types: Cigarettes     Passive exposure: Current    Smokeless tobacco: Never   Substance and Sexual Activity    Alcohol use: Yes    Drug use: Not Currently     Social Drivers of Health     Financial Resource Strain: High Risk (09/03/2021)    Received from [facility], [facility]    Overall Financial Resource Strain (CARDIA)     Difficulty of Paying Living Expenses: Very hard   Food Insecurity: Food Insecurity Present (09/03/2021)    Received from [facility], [facility]    Hunger Vital Sign     Worried About Running Out of Food in the Last Year: Often true     Ran Out  of Food in the Last Year: Often true   Transportation Needs: No Transportation Needs (09/03/2021)    Received from [facility], [facility]    PRAPARE - Transportation     Lack of Transportation (Medical): No     Lack of Transportation (Non-Medical): No    Received from [facility], [facility]    Social Network    Received from [facility], [facility]    HITS   Housing Stability: High Risk (09/03/2021)    Received from [facility], [facility]    Housing Stability Vital Sign     Unable to Pay for Housing in the Last Year: No     Number of Places Lived in the Last Year: 0     Unstable Housing in the Last Year: Yes       Meds:  Current Outpatient Medications on File Prior to Visit   Medication Sig Dispense Refill    amphetamine -dextroamphetamine  (ADDERALL, 10MG ,) 10 MG tablet Take 1 tablet by mouth daily for 30 days. Max Daily Amount: 10 mg 30 tablet 0    gabapentin  (NEURONTIN ) 600 MG tablet TAKE 1 TABLET BY MOUTH TWO (  2) TIMES A DAY. MAX DAILY AMOUNT: 2 TABS 60 tablet 3    rifAXIMin  (XIFAXAN ) 200 MG tablet Take 3 tablets by mouth 2 times daily 60 tablet 0    CREON 36000-114000 units CPEP delayed release capsule TAKE 2 (TWO) CAPSULE WITH MEALS AND SNACKS      escitalopram (LEXAPRO) 5 MG tablet Take 1 tablet by mouth daily (Patient not taking: Reported on 01/20/2023)      lactulose  (CHRONULAC ) 10 GM/15ML solution TAKE BY MOUTH TWICE A DAY      loratadine (CLARITIN) 10 MG tablet Take 1 tablet by mouth       No current facility-administered medications on file prior to visit.       Imaging:    CT Results (recent):  @BSHSILASTIMGCAT ([id number]:4)@    MRI Results (recent):  @BSHSILASTIMGCAT ([id number]:1)@    IR Results (recent):  @BSHSILASTIMGCAT ([id number]:4)@    VAS/US  Results (recent):  @BSHSILASTIMGCAT ([id number]:8)@    Reviewed records in [facility] and media tab today    Lab Review     No visits with results within 3 Month(s) from this visit.   Latest known visit with results is:   Orders Only on 03/02/2023   Component  Date Value Ref Range Status    WBC 03/02/2023 7.3  3.4 - 10.8 x10E3/uL Final    RBC 03/02/2023 3.99  3.77 - 5.28 x10E6/uL Final    Hemoglobin 03/02/2023 13.2  11.1 - 15.9 g/dL Final    Hematocrit 41/32/4401 39.2  34.0 - 46.6 % Final    MCV 03/02/2023 98 (H)  79 - 97 fL Final    MCH 03/02/2023 33.1 (H)  26.6 - 33.0 pg Final    MCHC 03/02/2023 33.7  31.5 - 35.7 g/dL Final    RDW 02/72/5366 12.6  11.7 - 15.4 % Final    Platelets 03/02/2023 224  150 - 450 x10E3/uL Final    Neutrophils % 03/02/2023 46  Not Estab. % Final    Lymphocytes % 03/02/2023 44  Not Estab. % Final    Monocytes % 03/02/2023 7  Not Estab. % Final    Eosinophils % 03/02/2023 2  Not Estab. % Final    Basophils % 03/02/2023 1  Not Estab. % Final    Neutrophils Absolute 03/02/2023 3.4  1.4 - 7.0 x10E3/uL Final    Lymphocytes Absolute 03/02/2023 3.2 (H)  0.7 - 3.1 x10E3/uL Final    Monocytes Absolute 03/02/2023 0.5  0.1 - 0.9 x10E3/uL Final    Eosinophils Absolute 03/02/2023 0.1  0.0 - 0.4 x10E3/uL Final    Basophils Absolute 03/02/2023 0.0  0.0 - 0.2 x10E3/uL Final    Immature Granulocytes % 03/02/2023 0  Not Estab. % Final    Immature Grans (Abs) 03/02/2023 0.0  0.0 - 0.1 x10E3/uL Final         Objective:       Exam:  There were no vitals filed for this visit.  Gen: Awake, alert, follows commands  Appropriate appearance, normal speech.  Oriented to all spheres.  No visual field defect on confrontation exam.  Full eyes movement, with no nystagmus, no diplopia, no ptosis.  Normal gag and swallow.  All remaining cranial nerves were normal  Motor function: 5/5 in all extremities  Sensory: intact to LT, PP and JPS  DTRs ++ in all extremities, (-) Babinski  Good FTN and HTS   Gait: Normal    Assessment:     No diagnosis found.    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     Mild ADHD     MRI brain (06/29/19): Non-specific white matter  changes        Plan:   1.  2.  3.    Continue Aderrall 10 mg QD to help with attention. Patient to inform GI doctor to see if medication is okay.  2. Stress importance of quitting alcohol. She acknowledges that it is hard to do  3. Continue Gabapentin  600 mg BID for foot pain  4. Continue Thiamine  100 mg every day   5. Follow up with GI regarding hepatic encephalopathy and problem with pancreas         No follow-ups on file.           Willetta Harpin, MD  Diplomate, American Board of Psychiatry and Neurology  Diplomate, Neuromuscular Medicine  Diplomate, American Board of Electrodiagnostic Medicine

## 2023-09-28 ENCOUNTER — Encounter (INDEPENDENT_AMBULATORY_CARE_PROVIDER_SITE_OTHER): Payer: Self-pay | Admitting: Internal Medicine

## 2023-11-02 ENCOUNTER — Telehealth (INDEPENDENT_AMBULATORY_CARE_PROVIDER_SITE_OTHER): Payer: Self-pay | Admitting: Internal Medicine

## 2023-11-02 NOTE — Telephone Encounter (Signed)
 Pt mom called stating pt was in the hospital and is currently in rehab for recovery. 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 Please advise

## 2023-11-04 NOTE — Telephone Encounter (Signed)
 My schedule is reduced and I cannot take new patients at this time. Please schedule with Dr. Phil or Dr. Drusilla

## 2023-11-04 NOTE — Telephone Encounter (Signed)
 Mom called again for an update to last message which looks like it was never routed to provider. Pls advise. Mom-Pam Kaesshaefer [phone]

## 2023-11-05 NOTE — Telephone Encounter (Signed)
 Spoke to mother of the patient, as patient is still in rehab.     She will get Royal to call back once she is released from Rehab.

## 2023-12-11 ENCOUNTER — Inpatient Hospital Stay
Admit: 2023-12-11 | Discharge: 2023-12-11 | Disposition: A | Discharge status: Home / Self Care | Payer: Medicaid (Managed Care) | Arrived: VH | Attending: Student in an Organized Health Care Education/Training Program

## 2023-12-11 DIAGNOSIS — R21 Rash and other nonspecific skin eruption: Principal | ICD-10-CM

## 2023-12-11 DIAGNOSIS — B86 Scabies: Secondary | ICD-10-CM

## 2023-12-11 LAB — CBC WITH AUTO DIFFERENTIAL
Basophils %: 0.4 % (ref 0.0–1.0)
Basophils Absolute: 0.02 K/UL (ref 0.00–0.10)
Eosinophils %: 1.3 % (ref 0.0–7.0)
Eosinophils Absolute: 0.07 K/UL (ref 0.00–0.40)
Hematocrit: 39.1 % (ref 35.0–47.0)
Hemoglobin: 13.3 g/dL (ref 11.5–16.0)
Immature Granulocytes %: 0.2 % (ref 0.0–0.5)
Immature Granulocytes Absolute: 0.01 K/UL (ref 0.00–0.04)
Lymphocytes %: 36.1 % (ref 12.0–49.0)
Lymphocytes Absolute: 1.9 K/UL (ref 0.80–3.50)
MCH: 32.3 pg (ref 26.0–34.0)
MCHC: 34 g/dL (ref 30.0–36.5)
MCV: 94.9 FL (ref 80.0–99.0)
MPV: 9.8 FL (ref 8.9–12.9)
Monocytes %: 10.6 % (ref 5.0–13.0)
Monocytes Absolute: 0.56 K/UL (ref 0.00–1.00)
Neutrophils %: 51.4 % (ref 32.0–75.0)
Neutrophils Absolute: 2.71 K/UL (ref 1.80–8.00)
Nucleated RBCs: 0 /100{WBCs}
Platelets: 212 K/uL (ref 150–400)
RBC: 4.12 M/uL (ref 3.80–5.20)
RDW: 14.8 % — ABNORMAL HIGH (ref 11.5–14.5)
WBC: 5.3 K/uL (ref 3.6–11.0)
nRBC: 0 K/uL (ref 0.00–0.01)

## 2023-12-11 LAB — URINALYSIS WITH MICROSCOPIC
BACTERIA, URINE: NEGATIVE /HPF
Bilirubin, Urine: NEGATIVE
Blood, Urine: NEGATIVE
Glucose, Ur: NEGATIVE mg/dL
Ketones, Urine: NEGATIVE mg/dL
Leukocyte Esterase, Urine: NEGATIVE
Nitrite, Urine: NEGATIVE
Protein, UA: NEGATIVE mg/dL
Specific Gravity, UA: 1.018 (ref 1.003–1.030)
Urobilinogen, Urine: 0.2 EU/dL (ref 0.2–1.0)
pH, Urine: 5.5 (ref 5.0–8.0)

## 2023-12-11 LAB — URINE DRUG SCREEN
Amphetamine, Urine: NEGATIVE
Barbiturates, Urine: NEGATIVE
Benzodiazepines, Urine: NEGATIVE
Cocaine, Urine: POSITIVE — AB
Fentanyl: NEGATIVE
Methadone, Urine: NEGATIVE
Opiates, Urine: NEGATIVE
Phencyclidine, Urine: NEGATIVE
THC, TH-Cannabinol, Urine: NEGATIVE

## 2023-12-11 LAB — EXTRA TUBES HOLD

## 2023-12-11 LAB — URINE CULTURE HOLD SAMPLE

## 2023-12-11 LAB — COMPREHENSIVE METABOLIC PANEL
ALT: 56 U/L — ABNORMAL HIGH (ref 10–35)
AST: 64 U/L — ABNORMAL HIGH (ref 10–35)
Albumin/Globulin Ratio: 1.2 (ref 1.1–2.2)
Albumin: 4.3 g/dL (ref 3.5–5.2)
Alk Phosphatase: 65 U/L (ref 35–104)
Anion Gap: 12 mmol/L (ref 2–14)
BUN/Creatinine Ratio: 12 (ref 12–20)
BUN: 10 mg/dL (ref 6–20)
CO2: 25 mmol/L (ref 20–29)
Calcium: 10 mg/dL (ref 8.6–10.0)
Chloride: 103 mmol/L (ref 98–107)
Creatinine: 0.89 mg/dL (ref 0.60–1.00)
Est, Glom Filt Rate: 84 ml/min/1.73m2 (ref >59)
Globulin: 3.5 g/dL (ref 2.0–4.0)
Glucose: 84 mg/dL (ref 65–100)
Potassium: 4.6 mmol/L (ref 3.5–5.1)
Sodium: 140 mmol/L (ref 136–145)
Total Bilirubin: 0.4 mg/dL (ref 0.0–1.2)
Total Protein: 7.7 g/dL (ref 6.4–8.3)

## 2023-12-11 LAB — ETHANOL: Ethanol Lvl: <11 mg/dL (ref <11)

## 2023-12-11 LAB — AMMONIA: Ammonia: 20 umol/L (ref 11–51)

## 2023-12-11 MED ORDER — DEXAMETHASONE SOD PHOSPHATE PF 10 MG/ML IJ SOLN
10 | Freq: Once | INTRAMUSCULAR | Status: AC
Start: 2023-12-11 — End: 2023-12-11
  Administered 2023-12-11: 22:00:00 6 mg via INTRAVENOUS

## 2023-12-11 MED ORDER — PERMETHRIN 5 % EX CREA
5 | CUTANEOUS | 0 refills | 30.00000 days | Status: AC
Start: 2023-12-11 — End: ?

## 2023-12-11 MED ORDER — DIPHENHYDRAMINE HCL 50 MG/ML IJ SOLN
50 | INTRAMUSCULAR | Status: AC
Start: 2023-12-11 — End: 2023-12-11
  Administered 2023-12-11: 22:00:00 25 mg via INTRAVENOUS

## 2023-12-11 MED FILL — DIPHENHYDRAMINE HCL 50 MG/ML IJ SOLN: 50 mg/mL | INTRAMUSCULAR | Qty: 1 | Fill #0

## 2023-12-11 MED FILL — DEXAMETHASONE SOD PHOSPHATE PF 10 MG/ML IJ SOLN: 10 mg/mL | INTRAMUSCULAR | Qty: 1 | Fill #0

## 2023-12-11 NOTE — ED Triage Notes (Signed)
 Pt arrives from home for a rash all over her body for the past few days. States she has a hx of eczema and is allergic to water. Breaks out when she sweats as well.

## 2023-12-11 NOTE — ED Notes (Signed)
 I have reviewed discharge instructions & discussed discharge medications with the patient. Opportunity for questions & clarifications was provided. All questions & concerns were addressed. The patient verbalized understanding. Pt left ambulatory in stable condition, no acute distress noted.

## 2023-12-11 NOTE — ED Provider Notes (Signed)
 [facility]  EMERGENCY DEPARTMENT ENCOUNTER      Pt Name: Michelle Singleton  MRN: [id number]  Birthdate 11-Jun-1985  Date of evaluation: 12/11/2023  Provider: Evalene Ship, PA    CHIEF COMPLAINT       Chief Complaint   Patient presents with    Rash         HISTORY OF PRESENT ILLNESS   (Location/Symptom, Timing/Onset, Context/Setting, Quality, Duration, Modifying Factors, Severity)  Note limiting factors.   Michelle Singleton is a 38 y.o. female with history of pancreatitis and hepatic encephalopathy who presents to the emergency department complaining of bumps and scratches all over my body.  She reports noticing for the past 5 to 6 days and reports constantly itching her body.  She reports using loratadine daily due to having allergy to water.  She reports applying hydrocortisone cream to her body with no relief.  Short-segment history of eczema but is denies any relief.  She reports it is irritating not pain.  She reports I think maybe is a nervous tic.  She ports I think too much about everything and might be going crazy.  She reports weekly starting Creon for pancreatitis.                  Review of External Medical Records:     Nursing Notes were reviewed.    REVIEW OF SYSTEMS    (2-9 systems for level 4, 10 or more for level 5)     Review of Systems    Except as noted above the remainder of the review of systems was reviewed and negative.       PAST MEDICAL HISTORY     Past Medical History:   Diagnosis Date    Hypertension     Liver disease          SURGICAL HISTORY     No past surgical history on file.      CURRENT MEDICATIONS       Previous Medications    AMPHETAMINE -DEXTROAMPHETAMINE  (ADDERALL, 10MG ,) 10 MG TABLET    Take 1 tablet by mouth daily for 30 days. Max Daily Amount: 10 mg    CREON 36000-114000 UNITS CPEP DELAYED RELEASE CAPSULE    TAKE 2 (TWO) CAPSULE WITH MEALS AND SNACKS    ESCITALOPRAM (LEXAPRO) 5 MG TABLET    Take 1 tablet by mouth daily    GABAPENTIN  (NEURONTIN ) 600 MG  TABLET    TAKE 1 TABLET BY MOUTH TWO (2) TIMES A DAY. MAX DAILY AMOUNT: 2 TABS    LACTULOSE  (CHRONULAC ) 10 GM/15ML SOLUTION    TAKE BY MOUTH TWICE A DAY    LORATADINE (CLARITIN) 10 MG TABLET    Take 1 tablet by mouth    RIFAXIMIN  (XIFAXAN ) 200 MG TABLET    Take 3 tablets by mouth 2 times daily       ALLERGIES     Water, sterile    FAMILY HISTORY     No family history on file.       SOCIAL HISTORY       Social History     Socioeconomic History    Marital status: Single   Tobacco Use    Smoking status: Every Day     Current packs/day: 0.50     Types: Cigarettes     Passive exposure: Current    Smokeless tobacco: Never   Substance and Sexual Activity    Alcohol use: Yes    Drug use: Not Currently  Social Drivers of Psychologist, prison and probation services Strain: High Risk (09/03/2021)    Received from [facility]    Overall Financial Resource Strain (CARDIA)     Difficulty of Paying Living Expenses: Very hard   Food Insecurity: Food Insecurity Present (09/18/2023)    Received [facility]    Hunger Vital Sign     Within the past 12 months, you worried that your food would run out before you got the money to buy more.: Sometimes true     Within the past 12 months, the food you bought just didn't last and you didn't have money to get more.: Sometimes true   Transportation Needs: Unmet Transportation Needs (09/18/2023)    Received from [facility]    [facility] - Transportation     In the past 12 months, has lack of transportation kept you from medical appointments or from getting medications?: Yes     In the past 12 months, has lack of transportation kept you from meetings, work, or from getting things needed for daily living?: Yes    Received from [employer]    Social Network    Received from [facility]    HITS   Housing Stability: High Risk (09/18/2023)    Received from [facility]    Housing Stability Vital Sign     In the last 12 months,  was there a time when you were not able to pay the mortgage or rent on time?: Yes     In the past 12 months, how many times have you moved where you were living?: 2     At any time in the past 12 months, were you homeless or living in a shelter (including now)?: Yes           PHYSICAL EXAM    (up to 7 for level 4, 8 or more for level 5)     ED Triage Vitals   BP Girls Systolic BP Percentile Girls Diastolic BP Percentile Boys Systolic BP Percentile Boys Diastolic BP Percentile Temp Temp src Pulse   -- -- -- -- -- -- -- --      Resp SpO2 Height Weight       -- -- -- --           Body mass index is 19.15 kg/m.    Physical Exam  Constitutional:       General: She is not in acute distress.     Appearance: She is not ill-appearing.      Comments: Well-appearing, no distress   HENT:      Head: Normocephalic.   Eyes:      General: No scleral icterus.  Cardiovascular:      Rate and Rhythm: Normal rate.   Pulmonary:      Effort: Pulmonary effort is normal. No respiratory distress.   Musculoskeletal:         General: Normal range of motion.      Cervical back: Normal range of motion.   Skin:     General: Skin is warm and dry.           Comments: Crusting rashes present primarily to upper and lower extremities.   Neurological:      Mental Status: She is alert. Mental status is at baseline.   Psychiatric:         Mood and Affect: Mood is anxious. Affect is inappropriate.  Behavior: Behavior is agitated.         DIAGNOSTIC RESULTS     EKG: All EKG's are interpreted by the Emergency Department Physician who either signs or Co-signs this chart in the absence of a cardiologist.        RADIOLOGY:   Non-plain film images such as CT, Ultrasound and MRI are read by the radiologist. Plain radiographic images are visualized and preliminarily interpreted by the emergency physician with the below findings:        Interpretation per the Radiologist below, if available at the time of this note:    No orders to display         LABS:  Labs Reviewed   CBC WITH AUTO DIFFERENTIAL - Abnormal; Notable for the following components:       Result Value    RDW 14.8 (*)     All other components within normal limits   COMPREHENSIVE METABOLIC PANEL - Abnormal; Notable for the following components:    ALT 56 (*)     AST 64 (*)     All other components within normal limits   URINE CULTURE HOLD SAMPLE   URINALYSIS WITH MICROSCOPIC   ETHANOL   AMMONIA   EXTRA TUBES HOLD   URINE DRUG SCREEN       All other labs were within normal range or not returned as of this dictation.    EMERGENCY DEPARTMENT COURSE and DIFFERENTIAL DIAGNOSIS/MDM:   Vitals:    Vitals:    12/11/23 1547 12/11/23 1937   BP: 125/78 (!) 127/92   Pulse: (!) 103 69   Resp: 20 18   Temp: 98.6 F (37 C) 98.1 F (36.7 C)   TempSrc: Oral    SpO2: 98% 97%   Weight: 50.6 kg (111 lb 8.8 oz)    Height: 1.626 m (5' 4)            Medical Decision Making  Neal Oshea is a 38 y.o. female with history of pancreatitis and hepatic encephalopathy who presents to the emergency department complaining of bumps and scratches all over my body.  She reports noticing for the past 5 to 6 days and reports constantly itching her body.  She reports using loratadine daily due to having allergy to water.  She reports applying hydrocortisone cream to her body with no relief.  Short-segment history of eczema but is denies any relief.  She reports it is irritating not pain.  She reports I think maybe is a nervous tic.  She ports I think too much about everything and might be going crazy.  She reports weekly starting Creon for pancreatitis.    Upon examination, patient is ambulatory and afebrile does not appear in any acute distress however is not acting appropriately throughout my examination.  She is anxious appearing and constantly fidgeting for throughout my examination.  She is contracting herself throughout her examination.  Differential diagnosis include but not limited to contact dermatitis, irritant  dermatitis, scabies, eczema, psoriasis, delusional parasitosis.  Workup unremarkable.  No leukocytosis noted.  High suspicion at this time for scabies therefore discharge patient home with permethrin  cream.    Amount and/or Complexity of Data Reviewed  Labs: ordered.    Risk  Prescription drug management.            REASSESSMENT            CONSULTS:  None    PROCEDURES:  Unless otherwise noted below, none     Procedures  FINAL IMPRESSION      1. Rash and other nonspecific skin eruption    2. Scabies          DISPOSITION/PLAN   DISPOSITION Decision To Discharge 12/11/2023 07:24:20 PM      PATIENT REFERRED TO:  [facility]  [address]  [place]  [phone]  [phone]    If symptoms worsen    [facility]   [address]  [place]  [address]  [phone]    As needed      DISCHARGE MEDICATIONS:  New Prescriptions    PERMETHRIN  (ELIMITE ) 5 % CREAM    Apply topically as directed         (Please note that portions of this note were completed with a voice recognition program.  Efforts were made to edit the dictations but occasionally words are mis-transcribed.)    Evalene Ship, PA (electronically signed)  Emergency Attending Physician / Physician Assistant / Nurse Practitioner             Ship Evalene, PA  12/11/23 1945
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
## 2024-01-08 NOTE — Care Plan (Signed)
 Problem: Discharge Planning  Goal: Discharge To Home Or Other Facility With Appropriate Resources  Description: Michelle Singleton stated I want to find a job after my alcohol detox  LTG: Michelle Singleton will be able to verbalize at least two support resources by discharge.      Interventions:  1. Identify Barriers To Discharge W/Pt And Caregiver  2. Arrange For Needed Discharge Resources And Transportation As Appropriate  3. Identify Discharge Learning Needs (Meds, Wound Care, Etc)  4. Arrange For Interpreters To Assist At Discharge As Needed  5. Refer To Chief Of Staff If The Patient Needs Post-Hospital Services Based On Physician Order Or Complex Needs Related To Functional Status, Cognitive Ability Or Social Support System  Note: Pt signed a ROI for [facility]. Will send clinical once it is available.

## 2024-01-09 NOTE — Care Plan (Signed)
 Problem: Discharge Planning  Goal: Discharge To Home Or Other Facility With Appropriate Resources  Description: Michelle Singleton stated I want to find a job after my alcohol detox  LTG: Rukia will be able to verbalize at least two support resources by discharge.      Interventions:  1. Identify Barriers To Discharge W/Pt And Caregiver  2. Arrange For Needed Discharge Resources And Transportation As Appropriate  3. Identify Discharge Learning Needs (Meds, Wound Care, Etc)  4. Arrange For Interpreters To Assist At Discharge As Needed  5. Refer To Chief Of Staff If The Patient Needs Post-Hospital Services Based On Physician Order Or Complex Needs Related To Functional Status, Cognitive Ability Or Social Support System  Note: CM returned phone call to Pt's mother Michelle Singleton to explain how the process works for a patient to go bed-to-bed to [facility]. Pt's mother verbalized an understanding. Pt's mother provided with Writer's direct number.

## 2024-01-09 NOTE — Care Plan (Signed)
 Problem: Discharge Planning  Goal: Discharge To Home Or Other Facility With Appropriate Resources  Description: Michelle Singleton stated I want to find a job after my alcohol detox  LTG: Michelle Singleton will be able to verbalize at least two support resources by discharge.      Interventions:  1. Identify Barriers To Discharge W/Pt And Caregiver  2. Arrange For Needed Discharge Resources And Transportation As Appropriate  3. Identify Discharge Learning Needs (Meds, Wound Care, Etc)  4. Arrange For Interpreters To Assist At Discharge As Needed  5. Refer To Chief Of Staff If The Patient Needs Post-Hospital Services Based On Physician Order Or Complex Needs Related To Functional Status, Cognitive Ability Or Social Support System  01/09/2024 1247 by Dorothyann Rocks  Note: Clinical sent to [facility].

## 2024-01-11 NOTE — Care Plan (Signed)
 Problem: Discharge Planning  Goal: Discharge To Home Or Other Facility With Appropriate Resources  Description: Sanah stated I want to find a job after my alcohol detox  LTG: Shirah will be able to verbalize at least two support resources by discharge.      Interventions:  1. Identify Barriers To Discharge W/Pt And Caregiver  2. Arrange For Needed Discharge Resources And Transportation As Appropriate  3. Identify Discharge Learning Needs (Meds, Wound Care, Etc)  4. Arrange For Interpreters To Assist At Discharge As Needed  5. Refer To Chief Of Staff If The Patient Needs Post-Hospital Services Based On Physician Order Or Complex Needs Related To Functional Status, Cognitive Ability Or Social Support System  01/11/2024 1228 by Dorothyann Rocks  Note: CM received an email from admissions at [facility]. Clinicals received and in review with Medical.

## 2024-01-11 NOTE — Care Plan (Signed)
 Problem: Discharge Planning  Goal: Discharge To Home Or Other Facility With Appropriate Resources  Description: Sonny stated I want to find a job after my alcohol detox  LTG: Blakelynn will be able to verbalize at least two support resources by discharge.      Interventions:  1. Identify Barriers To Discharge W/Pt And Caregiver  2. Arrange For Needed Discharge Resources And Transportation As Appropriate  3. Identify Discharge Learning Needs (Meds, Wound Care, Etc)  4. Arrange For Interpreters To Assist At Discharge As Needed  5. Refer To Chief Of Staff If The Patient Needs Post-Hospital Services Based On Physician Order Or Complex Needs Related To Functional Status, Cognitive Ability Or Social Support System  01/11/2024 1126 by Dorothyann Rocks  Note: CM attempted to call admissions regarding cinicals, got VM, did not leave a 2nd message.

## 2024-01-11 NOTE — Care Plan (Signed)
 Problem: Discharge Planning  Goal: Discharge To Home Or Other Facility With Appropriate Resources  Description: Adlynn stated I want to find a job after my alcohol detox  LTG: Annasophia will be able to verbalize at least two support resources by discharge.      Interventions:  1. Identify Barriers To Discharge W/Pt And Caregiver  2. Arrange For Needed Discharge Resources And Transportation As Appropriate  3. Identify Discharge Learning Needs (Meds, Wound Care, Etc)  4. Arrange For Interpreters To Assist At Discharge As Needed  5. Refer To Chief Of Staff If The Patient Needs Post-Hospital Services Based On Physician Order Or Complex Needs Related To Functional Status, Cognitive Ability Or Social Support System  Note: CM received a voicemail from the Pt's mother that she called [facility] this morning and they reported the clinicals faxed on Saturday were not received. Printed out clinicals, scanned, and sent by email. Called Admissions and left a VM to inform of the above, requested verification that the email/clinicals received. Called Pt's mother to update her.

## 2024-01-12 NOTE — Care Plan (Signed)
 Patient's CIWA has remained at zero for the past 24 hours.  Patient is positive and interacts well with others in the milieu.  Patient did not request any PRN pain medication.  Patient rested/slept for most of the second half of the night shift.    Problem: Drug Abuse/Detox  Goal: Will Have No Detox Symptoms And Will Verbalize Plan For Changing Drug-Related Behavior  Description: Waneda stated  I want to be free from alcohol withdrawal symptoms  STG: Porsha will have a CIWA score less than 4 daily by 01/21/2024    Interventions:  1. Administer Medication As Ordered  Patient accepted prescribed medications with appropriate questions regarding indications and side effects.  2. Monitor Physical Status  Patient walked regurlarly in the common spaces interacting in a positive manner with others.  3. Provide Emotional Support With 1:1 Interaction With Staff  Patient participated in 5 groups during the past 24 hours.  4. Encourage 12-Step Involvement Or Recovery Focused Alternative  Patient looking forward to discharge when the time is appropriate.  Outcome: Progressing

## 2024-01-12 NOTE — Progress Notes (Signed)
 Group Therapy Note    Group Date/Time: 01/12/2024 1115    Group Topic:BH Spirituality     Group Department: Chaplaincy Services    Group Facilitator:  Burleigh Hug    Group Subtopic:N/A     Patient Problem or Treatment Addressed: Ineffective Coping     Teaching Methods/Interventions: Encouraged, Explained, Group Discussion, and Written Material Given     Audio Visual Played (if applicable):  N/A    Written Material Given (if applicable):  [employer]    Behavior: Calm      Affect/Mood: Euthymic     Thoughts: Clear     Readiness to Learn/Patient Participation Level: Active and Listened Attentively     Attention Span: Alert and Focused: Able to focus on topic/task for 15 minutes     Barriers to Learning: None=no barriers     Patient/Family Response: Verbalizes Understanding of Information and Applies Knowledge     Patient participated in the group activity and discussion. Patient talked about understanding the story as about how people can make a difference. 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    Burleigh Hug, Ph.D., [facility]  Staff Chaplain [facility]  [phone]  01/12/2024 12:34

## 2024-01-12 NOTE — Progress Notes (Signed)
 Group Therapy Note    Group Date/Time: 01/12/2024 0930    Group Topic:BH Goals     Group Department: BHIP     Group Facilitator:  Georgelene Humberg    Group Subtopic:Goals     Patient Problem or Treatment Addressed: Safety     Teaching Methods/Interventions: Encouraged     Audio Visual Played (if applicable):  N/A    Written Material Given (if applicable):  N/A    Behavior: Sociable      Affect/Mood: Euthymic     Thoughts: Clear     Readiness to Learn/Patient Participation Level: Active     Attention Span: Alert     Barriers to Learning: None=no barriers     Patient/Family Response: Verbalizes Understanding of Information     Care Plan Updated: No     Unit Rules Reviewed: Chaney Delois Finch, RN  01/12/2024 09:54

## 2024-01-12 NOTE — Care Plan (Signed)
 Problem: Drug Abuse/Detox  Goal: Will Have No Detox Symptoms And Will Verbalize Plan For Changing Drug-Related Behavior  Description: Michelle Singleton stated  I want to be free from alcohol withdrawal symptoms  STG: Michelle Singleton will have a CIWA score less than 4 daily by 01/21/2024      Interventions:  1. Administer Medication As Ordered  2. Monitor Physical Status  3. Provide Emotional Support With 1:1 Interaction With Staff  4. Encourage 12-Step Involvement Or Recovery Focused Alternative  Outcome: Progressing  Note: Michelle Singleton denies symptoms of alcohol withdrawal this morning.     Problem: Other Unique Needs Related To Psych Admission  Goal: Therapeutic Activity Goals  Description: Michelle Singleton stated I want to go to groups and learn coping skills  LTG: Michelle Singleton will attend at least 3 groups per day while in the hospital to learn effective coping skills by 01/21/2024.      Therapeutic Activity Interventions  Coping, Relaxation, Exercise, Recreation And Leisure Education Are Documented In The Daily Care Plan Interventions Flowsheet   Outcome: Progressing  Note: Michelle Singleton's goal is to go to a long term rehab.     Problem: Discharge Planning  Goal: Discharge To Home Or Other Facility With Appropriate Resources  Description: Michelle Singleton stated I want to find a job after my alcohol detox  LTG: Michelle Singleton will be able to verbalize at least two support resources by discharge.      Interventions:  1. Identify Barriers To Discharge W/Pt And Caregiver  2. Arrange For Needed Discharge Resources And Transportation As Appropriate  3. Identify Discharge Learning Needs (Meds, Wound Care, Etc)  4. Arrange For Interpreters To Assist At Discharge As Needed  5. Refer To Chief Of Staff If The Patient Needs Post-Hospital Services Based On Physician Order Or Complex Needs Related To Functional Status, Cognitive Ability Or Social Support System  Outcome: Progressing  Note: Michelle Singleton is scheduled for discharge today.      Problem: At Risk for Fall or Fall Related Injury  Goal: Patient Will Be Free From Falls and Fall Related Injury  Description: Michelle Singleton states, I have a history of DT's. and need help with this.    DUH:Michelle Singleton will be free of falls and fall related injury before 01/22/2024.    Outcome: Progressing  Note: Fall precautions maintained.

## 2024-02-18 NOTE — ED Provider Notes (Signed)
 [facility]        EMERGENCY DEPARTMENT HISTORY AND PHYSICAL EXAM      Patient Name: Michelle Singleton   Date of Birth: 24-Dec-1985  Medical Record Number: [id number]     History of Presenting Illness      Chief Complaint:   Chief Complaint   Patient presents with    Rash     Pt arrives with c/o rashes to neck onset x4 days, worsen everyday. Pt states hx of eczema +itchiness and painful.         History Provided By: patient     History: Michelle Singleton is a 38 y.o. female who presents with redness, itching, irritation to the neck bilaterally over the past several days.  She reports that she has been sleeping in the hospital with her mother, who is currently a patient upstairs in the inpatient unit.  She has a history of very sensitive skin, with localized allergic reaction and itching symptoms sometimes triggered by something as minor as excessive perspiration.  She believes it by sleeping against a plastic pillow surface this has caused some localized irritation over the past few days.    No difficulty breathing, no intraoral swelling, no chest pain, no shortness of breath, no other acute concerns reported.     Primary care provider: NONE PCP   Specialist(s):     Past History   Past Medical History:   No past medical history on file.     Past Surgical History:    No past surgical history on file.      Family History:   No family history on file.     Social History:          Allergies:   No Known Allergies     Home Medications:  Current Medications            predniSONE (DELTASONE) 20 mg tablet    predniSONE (DELTASONE) 20 mg tablet (Discontinued)    predniSONE (DELTASONE) 20 mg tablet (Discontinued)          Hospital Medications            predniSONE (DELTASONE) tablet 40 mg (Completed)                 Review of Systems    Review of Systems  - all other systems reviewed and found to be negative.         Physical Exam     ED Triage Vitals [02/18/24 1610]   Temp Heart Rate Resp BP SpO2   98.7  F (37.1 C) 75 16 116/68 98 %      Temp Source Heart Rate Source Patient Position BP Location FiO2 (%) Set   Oral Monitor Sitting Left arm --         Physical Exam  Constitutional:       General: She is not in acute distress.     Appearance: Normal appearance.   HENT:      Head: Normocephalic and atraumatic.      Nose: Nose normal.      Mouth/Throat:      Mouth: Mucous membranes are moist.      Pharynx: Oropharynx is clear.   Eyes:      Extraocular Movements: Extraocular movements intact.      Pupils: Pupils are equal, round, and reactive to light.   Cardiovascular:      Rate and Rhythm: Normal rate and regular rhythm.      Heart  sounds: Normal heart sounds. No murmur heard.  Pulmonary:      Effort: Pulmonary effort is normal. No respiratory distress.      Breath sounds: Normal breath sounds.   Abdominal:      Palpations: Abdomen is soft.      Tenderness: There is no abdominal tenderness. There is no guarding or rebound.   Musculoskeletal:         General: Normal range of motion.      Cervical back: Normal range of motion. No rigidity.      Comments: Diffuse, mild skin erythema noted to the neck bilaterally.  No blistering, no tenderness to palpation.   Skin:     General: Skin is warm and dry.   Neurological:      General: No focal deficit present.      Mental Status: She is alert and oriented to person, place, and time.      Cranial Nerves: No cranial nerve deficit.   Psychiatric:         Mood and Affect: Mood normal.         Thought Content: Thought content normal.                Diagnostic Study Results   Labs -   Labs Reviewed - No data to display     Radiologic Studies -  No orders to display          Medical Decision Making         ED Course:   ED Course as of 02/18/24 1753   Thu Feb 18, 2024   1618 Initial evaluation complete.  She has some localized skin irritation to the neck suggestive of localized allergic reaction.    Symptoms are mild, no signs of anaphylaxis, she is generally well-appearing.  She is  already taking loratadine, advised Benadryl  as needed in addition to short course of prednisone. [IW]      ED Course User Index  [IW] Chauncey CHRISTELLA Durand, MD        Procedures     Disposition and Diagnosis     Diagnoses for today's visit:   1. Rash      Discharge Medication List as of 02/18/2024  4:43 PM        _______________________________   Attestations:     I, Dr. Chauncey CHRISTELLA. Durand, MD,  agree with the above documentation, which was reviewed for accuracy and completeness. Obtaining the history, performing the physical exam, and formulating the assessment and plan were performed by me. I am the provider of record.    _______________________________      Some transcription errors may be present due to use of Dragon (voice recognition) software.

## 2024-03-29 ENCOUNTER — Emergency Department: Admit: 2024-03-29 | Payer: Medicaid (Managed Care) | Primary: Internal Medicine

## 2024-03-29 ENCOUNTER — Inpatient Hospital Stay
Admit: 2024-03-29 | Discharge: 2024-03-29 | Disposition: A | Discharge status: Home / Self Care | Payer: Medicaid (Managed Care) | Arrived: AM | Attending: Emergency Medicine

## 2024-03-29 LAB — CBC WITH AUTO DIFFERENTIAL
Basophils %: 0.4 % (ref 0.0–1.0)
Basophils Absolute: 0.03 K/UL (ref 0.00–0.10)
Eosinophils %: 0.4 % (ref 0.0–7.0)
Eosinophils Absolute: 0.03 K/UL (ref 0.00–0.40)
Hematocrit: 39.7 % (ref 35.0–47.0)
Hemoglobin: 13 g/dL (ref 11.5–16.0)
Immature Granulocytes %: 0.3 % (ref 0.0–0.5)
Immature Granulocytes Absolute: 0.02 K/UL (ref 0.00–0.04)
Lymphocytes %: 18 % (ref 12.0–49.0)
Lymphocytes Absolute: 1.43 K/UL (ref 0.80–3.50)
MCH: 32.1 pg (ref 26.0–34.0)
MCHC: 32.7 g/dL (ref 30.0–36.5)
MCV: 98 FL (ref 80.0–99.0)
MPV: 10.6 FL (ref 8.9–12.9)
Monocytes %: 10.7 % (ref 5.0–13.0)
Monocytes Absolute: 0.85 K/UL (ref 0.00–1.00)
Neutrophils %: 70.2 % (ref 32.0–75.0)
Neutrophils Absolute: 5.59 K/UL (ref 1.80–8.00)
Nucleated RBCs: 0 /100{WBCs}
Platelets: 218 K/uL (ref 150–400)
RBC: 4.05 M/uL (ref 3.80–5.20)
RDW: 13.7 % (ref 11.5–14.5)
WBC: 8 K/uL (ref 3.6–11.0)
nRBC: 0 K/uL (ref 0.00–0.01)

## 2024-03-29 LAB — COMPREHENSIVE METABOLIC PANEL
ALT: 47 U/L — ABNORMAL HIGH (ref 10–35)
Albumin/Globulin Ratio: 1.2 (ref 1.1–2.2)
Albumin: 4.2 g/dL (ref 3.5–5.2)
Alk Phosphatase: 66 U/L (ref 35–104)
Anion Gap: 16 mmol/L — ABNORMAL HIGH (ref 2–14)
BUN/Creatinine Ratio: 20 (ref 12–20)
BUN: 14 mg/dL (ref 6–20)
CO2: 20 mmol/L (ref 20–29)
Calcium: 10 mg/dL (ref 8.6–10.0)
Chloride: 97 mmol/L — ABNORMAL LOW (ref 98–107)
Creatinine: 0.67 mg/dL (ref 0.60–1.00)
Est, Glom Filt Rate: >90 ml/min/1.73m2 (ref >59)
Globulin: 3.4 g/dL (ref 2.0–4.0)
Glucose: 66 mg/dL (ref 65–100)
Sodium: 132 mmol/L — ABNORMAL LOW (ref 136–145)
Total Bilirubin: 0.9 mg/dL (ref 0.0–1.2)
Total Protein: 7.6 g/dL (ref 6.4–8.3)

## 2024-03-29 LAB — EKG 12-LEAD
Atrial Rate: 80 {beats}/min
Diagnosis: NORMAL
P Axis: 70 degrees
P-R Interval: 138 ms
Q-T Interval: 392 ms
QRS Duration: 96 ms
QTc Calculation (Bazett): 452 ms
R Axis: 84 degrees
T Axis: 77 degrees
Ventricular Rate: 80 {beats}/min

## 2024-03-29 LAB — EXTRA TUBES HOLD

## 2024-03-29 LAB — ETHANOL: Ethanol Lvl: <11 mg/dL (ref <11)

## 2024-03-29 LAB — AMMONIA: Ammonia: 48 umol/L (ref 11–51)

## 2024-03-29 MED ORDER — SODIUM CHLORIDE 0.9 % IV BOLUS
0.9 | Freq: Once | INTRAVENOUS | Status: AC
Start: 2024-03-29 — End: 2024-03-29
  Administered 2024-03-29: 13:00:00 1000 mL via INTRAVENOUS

## 2024-03-29 MED FILL — SODIUM CHLORIDE 0.9 % IV SOLN: 0.9 % | INTRAVENOUS | Qty: 1000 | Fill #0

## 2024-03-29 NOTE — ED Provider Notes (Signed)
 [facility] EMERGENC[facility] DEPARTMENT  EMERGENCY DEPARTMENT ENCOUNTER      Pt Name: Michelle Singleton  MRN: [id number]  Birthdate 12/07/1985  Date of evaluation: 03/29/2024  Provider: Charlie Kays, MD    CHIEF COMPLAINT       Chief Complaint   Patient presents with    Anxiety         HISTORY OF PRESENT ILLNESS   (Location/Symptom, Timing/Onset, Context/Setting, Quality, Duration, Modifying Factors, Severity)  Note limiting factors.   39 year old with a history of hypertension, alcohol induced liver disease, chronic pancreatitis, neuropathy, ADHD, hepatic encephalopathy.  She presents via EMS after being found in a ditch by the [employer].  She is unaware how she ended up in the ditch.  She questions whether it could be from drinking too much alcohol.  She states that she drinks alcohol on a daily basis.  She also admits to using cocaine.  She states that she feels confused and cannot find her belongings.  She states she is currently living out of a motel.          Review of External Medical Records:     Nursing Notes were reviewed.    REVIEW OF SYSTEMS    (2-9 systems for level 4, 10 or more for level 5)     Review of Systems    Except as noted above the remainder of the review of systems was reviewed and negative.       PAST MEDICAL HISTORY     Past Medical History:   Diagnosis Date    Hypertension     Liver disease          SURGICAL HISTORY     No past surgical history on file.      CURRENT MEDICATIONS       Previous Medications    AMPHETAMINE -DEXTROAMPHETAMINE  (ADDERALL, 10MG ,) 10 MG TABLET    Take 1 tablet by mouth daily for 30 days. Max Daily Amount: 10 mg    CREON 36000-114000 UNITS CPEP DELAYED RELEASE CAPSULE    TAKE 2 (TWO) CAPSULE WITH MEALS AND SNACKS    ESCITALOPRAM (LEXAPRO) 5 MG TABLET    Take 1 tablet by mouth daily    GABAPENTIN  (NEURONTIN ) 600 MG TABLET    TAKE 1 TABLET BY MOUTH TWO (2) TIMES A DAY. MAX DAILY AMOUNT: 2 TABS    LACTULOSE  (CHRONULAC ) 10 GM/15ML SOLUTION    TAKE BY MOUTH  TWICE A DAY    LORATADINE (CLARITIN) 10 MG TABLET    Take 1 tablet by mouth    PERMETHRIN  (ELIMITE ) 5 % CREAM    Apply topically as directed    RIFAXIMIN  (XIFAXAN ) 200 MG TABLET    Take 3 tablets by mouth 2 times daily       ALLERGIES     Water, sterile    FAMILY HISTORY     No family history on file.       SOCIAL HISTORY       Social History     Socioeconomic History    Marital status: Single   Tobacco Use    Smoking status: Every Day     Current packs/day: 0.50     Types: Cigarettes     Passive exposure: Current    Smokeless tobacco: Never   Substance and Sexual Activity    Alcohol use: Yes    Drug use: Not Currently     Social Drivers of Health     Financial Resource Strain: Low Risk (01/07/2024)  Received from [facility]    Overall Financial Resource Strain (CARDIA)     How hard is it for you to pay for the very basics like food, housing, medical care, and heating?: Not hard at all   Food Insecurity: Food Insecurity Present (01/07/2024)    Received from [facility]    Hunger Vital Sign     Within the past 12 months, you worried that your food would run out before you got the money to buy more.: Sometimes true     Within the past 12 months, the food you bought just didn't last and you didn't have money to get more.: Sometimes true   Transportation Needs: Unmet Transportation Needs (01/07/2024)    Received from [facility]    [facility] - Transportation     In the past 12 months, has lack of transportation kept you from medical appointments or from getting medications?: Yes     In the past 12 months, has lack of transportation kept you from meetings, work, or from getting things needed for daily living?: Yes   Physical Activity: Sufficiently Active (01/07/2024)    Received from [facility]    Exercise Vital Sign     On average, how many days per week do you engage in moderate to strenuous exercise (like a brisk  walk)?: 7 days     On average, how many minutes do you engage in exercise at this level?: 60 min   Stress: Stress Concern Present (01/07/2024)    Received from [facility]    [facility] - Occupational Stress Questionnaire     Do you feel stress - tense, restless, nervous, or anxious, or unable to sleep at night because your mind is troubled all the time - these days?: To some extent   Social Connections: Moderately Integrated (01/07/2024)    Received from [facility]    Social Connection and Isolation Panel     In a typical week, how many times do you talk on the phone with family, friends, or neighbors?: Three times a week     How often do you get together with friends or relatives?: Never     How often do you attend church or religious services?: 1 to 4 times per year     Do you belong to any clubs or organizations such as church groups, unions, fraternal or athletic groups, or school groups?: No     How often do you attend meetings of the clubs or organizations you belong to?: Never     Are you married, widowed, divorced, separated, never married, or living with a partner?: Married    Received from [facility]    HITS   Housing Stability: High Risk (01/07/2024)    [facility]    Housing Stability Vital Sign     In the last 12 months, was there a time when you were not able to pay the mortgage or rent on time?: Yes     In the past 12 months, how many times have you moved where you were living?: 2     At any time in the past 12 months, were you homeless or living in a shelter (including now)?: Yes           PHYSICAL EXAM    (up to 7 for level 4, 8 or more  for level 5)     ED Triage Vitals [03/29/24 0645]   BP Girls Systolic BP Percentile Girls Diastolic BP Percentile Boys Systolic BP Percentile Boys Diastolic BP Percentile Temp Temp Source Pulse   115/78 -- -- -- -- 97.5 F (36.4 C) Oral 88       Respirations SpO2 Height Weight - Scale       16 98 % 1.626 m (5' 4) 49.9 kg (110 lb)           Body mass index is 18.88 kg/m.    Physical Exam  Vitals and nursing note reviewed.   Constitutional:       Comments: Disheveled appearance.  Sleeping but easily arousable.   HENT:      Head: Normocephalic and atraumatic.      Mouth/Throat:      Mouth: Mucous membranes are moist.   Eyes:      Conjunctiva/sclera: Conjunctivae normal.   Cardiovascular:      Rate and Rhythm: Normal rate and regular rhythm.      Heart sounds: No murmur heard.     No friction rub. No gallop.   Pulmonary:      Effort: Pulmonary effort is normal.      Breath sounds: Normal breath sounds.   Abdominal:      General: There is no distension.      Tenderness: There is no abdominal tenderness.   Musculoskeletal:         General: No swelling or deformity.      Cervical back: No rigidity.   Skin:     General: Skin is warm and dry.   Neurological:      General: No focal deficit present.      Mental Status: She is alert.   Psychiatric:         Mood and Affect: Mood normal.         DIAGNOSTIC RESULTS     EKG: All EKG's are interpreted by the Emergency Department Physician who either signs or Co-signs this chart in the absence of a cardiologist.    EKG: Normal sinus rhythm; rate of 80; nonspecific ST, T wave abnormalities.  Interpreted by me.  7:00 AM.  Charlie Kays, MD      RADIOLOGY:   Non-plain film images such as CT, Ultrasound and MRI are read by the radiologist. Plain radiographic images are visualized and preliminarily interpreted by the emergency physician with the below findings:        Interpretation per the Radiologist below, if available at the time of this note:    CT Head W/O Contrast    (Results Pending)        LABS:  Labs Reviewed   EXTRA TUBES HOLD   CBC WITH AUTO DIFFERENTIAL   COMPREHENSIVE METABOLIC PANEL   ETHANOL       All other labs were within normal range or not returned as of this dictation.    EMERGENCY DEPARTMENT COURSE and  DIFFERENTIAL DIAGNOSIS/MDM:   Vitals:    Vitals:    03/29/24 0645   BP: 115/78   Pulse: 88   Resp: 16   Temp: 97.5 F (36.4 C)   TempSrc: Oral   SpO2: 98%   Weight: 49.9 kg (110 lb)   Height: 1.626 m (5' 4)           Medical Decision Making  Amount and/or Complexity of Data Reviewed  Labs: ordered.  Radiology: ordered.  ECG/medicine tests: ordered.    Risk  Prescription drug management.            REASSESSMENT      Progress Note:  Results, treatment, and follow up plan have been discussed with patient.  Questions were answered.  She remains sleepy but is easily arousable.  She answers questions appropriately.  Charlie Kays, MD  8:35 AM        CONSULTS:  None    PROCEDURES:  Unless otherwise noted below, none     Procedures      FINAL IMPRESSION      Assessment/plan: 39 year old with a history of hypertension, alcohol induced liver disease, chronic pancreatitis, neuropathy, ADHD, hepatic encephalopathy.  She presents to the ED via EMS after being found in a ditch by the police.  Differential diagnosis includes intoxication, head injury, electrolyte abnormality, hepatic encephalopathy, infection, and others.  Reassuring appearance/exam with stable vital signs.  CBC, CMP, ammonia, head CT, EKG okay.  EtOH is negative so suspect illegal drug intoxication.  PCP follow-up.  Return precautions.  Charlie Kays, MD  8:36 AM      DISPOSITION/PLAN   DISPOSITION        PATIENT REFERRED TO:  No follow-up provider specified.    DISCHARGE MEDICATIONS:  New Prescriptions    No medications on file         (Please note that portions of this note were completed with a voice recognition program.  Efforts were made to edit the dictations but occasionally words are mis-transcribed.)    Charlie Kays, MD (electronically signed)  Emergency Attending Physician / Physician Assistant / Nurse Practitioner             Kays Charlie SAUNDERS, MD  03/29/24 [phone]

## 2024-03-29 NOTE — ED Notes (Signed)
 Bedside and Verbal shift change report given to Nailea Whitehorn RN (oncoming nurse) by Roetta PEAK (offgoing nurse). Report included the following information ED SBAR, MAR, and Recent Results.

## 2024-03-29 NOTE — ED Notes (Signed)
 Attempted to call [facility] without result. Patient states she does not know which motel she was at in [place]. Patient sitting on the end of the bed eating lunch tray.

## 2024-03-29 NOTE — ED Notes (Signed)
 Attempted to call Michelle Singleton in chart to come pick up patient. Line no longer in service and patient does not know number.

## 2024-03-29 NOTE — ED Triage Notes (Signed)
 Patient arrives to ER via EMS from a motel in [place]. Per EMS patient was found [place] a ditch by [employer]. Says she is anxious because she cannot find her phone to contact husband. + etoh + crack cocaine

## 2024-03-29 NOTE — ED Notes (Signed)
 Spoke to patient's mother sharlet. She states she is on oxygen and Marya smokes she can cannot stay with her. Gave a new number for [facility] [phone]. Attempted to call new number and went to voicemail.

## 2024-03-29 NOTE — Care Coordination (Addendum)
 Care Management - Received request from nursing for CM to assist with patient discharge. They spoke with patient's mother who lives in [place]. Patient cannot come back to her home as she is on oxygen and cannot have smoking in her home. They called spouse/significant other and number is chart is disconnected. Patient still is not able to tell them the name of the hotel where she is staying with spouse/significant other.    Called spouse/significant other Michelle Singleton' number on face sheet [phone]). The number is disconnected. Reviewed RN notes. Called the number for Michelle in the RN note [phone]. Left voicemail at 17:25.     Met with patient at bedside. She was still groggy. PCT told this CM. She called patient's father Michelle Singleton [phone]). He lives in [place]  and she is not able to come to his home as he has alcohol in the home. PCT was not able to reach patient's boyfriend. Patient does not remember the name of the hotel where she was staying. She had told the RN that she was agreeable to going to the [employer]. She initially agreed to go when this CM first spoke with her and then she changed her mind. She wants to go back to her husband but does not know which hotel he was staying at.     Per triage note, patient was picked up in [place] by EMS that [facility] ([employer]) had called. CM called [facility] [employer]) at [phone] and spoke with Zhe. Per the run report. Patient was picked up at [address] [place], [place]. They also said she was near the [facility]. Per [employer], that address is [phone] and phone [phone]. CM called the number and ask for the room for Michelle Singleton and then for [employer]. Receptionist said neither are currently staying there.     Spoke again with patient. She is now agreeable to going to the [employer].     Set up transportation with [employer] via [employer] for  18:25. CM and PCT escorted patient to the [employer]. CM spoke with [employer] driver to ensure he knew the destination and gave him the phone number for this CM. Patient asked for Michelle Singleton's number as she did not have her phone. Gave her the numbers for her husband and her father. Per PCT, patient already knew her mom's number.     19:15 Called father's number. His name is Michelle Singleton. He said the last number for [name] that he had was [phone]. CM called this number. The name on the voicemail was Michelle. Left voicemail requesting call back by 8 PM for update regarding his spouse.    Michelle Singleton, MSW
